# Patient Record
Sex: Female | Born: 1980 | Hispanic: No | Marital: Married | Smoking: Never smoker
Health system: Southern US, Community
[De-identification: ages and names within clinical notes are randomized; demographics above are authoritative.]

## PROBLEM LIST (undated history)

## (undated) ENCOUNTER — Inpatient Hospital Stay (HOSPITAL_COMMUNITY): Payer: Self-pay

## (undated) DIAGNOSIS — T4145XA Adverse effect of unspecified anesthetic, initial encounter: Secondary | ICD-10-CM

## (undated) DIAGNOSIS — F32A Depression, unspecified: Secondary | ICD-10-CM

## (undated) DIAGNOSIS — T8859XA Other complications of anesthesia, initial encounter: Secondary | ICD-10-CM

## (undated) DIAGNOSIS — K219 Gastro-esophageal reflux disease without esophagitis: Secondary | ICD-10-CM

## (undated) DIAGNOSIS — F329 Major depressive disorder, single episode, unspecified: Secondary | ICD-10-CM

## (undated) DIAGNOSIS — R0600 Dyspnea, unspecified: Secondary | ICD-10-CM

## (undated) DIAGNOSIS — G971 Other reaction to spinal and lumbar puncture: Secondary | ICD-10-CM

---
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
## 2013-04-30 ENCOUNTER — Ambulatory Visit (INDEPENDENT_AMBULATORY_CARE_PROVIDER_SITE_OTHER): Payer: Self-pay | Admitting: Obstetrics and Gynecology

## 2013-04-30 ENCOUNTER — Encounter: Payer: Self-pay | Admitting: Obstetrics and Gynecology

## 2013-04-30 VITALS — BP 109/68 | HR 76 | Ht 65.0 in | Wt 156.0 lb

## 2013-04-30 DIAGNOSIS — Z01812 Encounter for preprocedural laboratory examination: Secondary | ICD-10-CM

## 2013-04-30 DIAGNOSIS — Z309 Encounter for contraceptive management, unspecified: Secondary | ICD-10-CM

## 2013-04-30 DIAGNOSIS — IMO0001 Reserved for inherently not codable concepts without codable children: Secondary | ICD-10-CM

## 2013-04-30 DIAGNOSIS — Z9889 Other specified postprocedural states: Secondary | ICD-10-CM

## 2013-04-30 DIAGNOSIS — Z98891 History of uterine scar from previous surgery: Secondary | ICD-10-CM

## 2013-04-30 LAB — POCT URINE PREGNANCY: Preg Test, Ur: NEGATIVE

## 2013-04-30 MED ORDER — NORGESTIMATE-ETH ESTRADIOL 0.25-35 MG-MCG PO TABS: 1.0000 | ORAL_TABLET | Freq: Every day | ORAL | Status: DC

## 2013-04-30 NOTE — Progress Notes (Signed)
  Subjective:    Patient ID: Roberta Reynolds, female    DOB: September 04, 1980, 32 y.o.   MRN: [id number]  HPI  32 yo G3P2012 with LMP 04/01/2013 presenting today requesting contraception. Patient delivered via repeat cesarean section in [place] in June and was started on Depo Provera postpartum. Patient does not like the increased in appetite and weight gain caused by the depo and does not like the irregular bleeding. She desires to be restarted on OCP which she has used in the past. Patient is without any other concerns  History reviewed. No pertinent past medical history. Past Surgical History  Procedure Laterality Date  . Cesarean section     History reviewed. No pertinent family history. History  Substance Use Topics  . Smoking status: Never Smoker   . Smokeless tobacco: Never Used  . Alcohol Use: No      Review of Systems  All other systems reviewed and are negative.       Objective:   Physical Exam  Not indicated     Assessment & Plan:  32 yo here for OCP initiation - no contraindications to OCP  - Rx Sprintec provided. Patient advised to use back up birth control for 2 weeks - RTC for annual exam

## 2013-05-21 ENCOUNTER — Ambulatory Visit: Payer: Self-pay | Admitting: Obstetrics and Gynecology

## 2013-05-21 DIAGNOSIS — Z01419 Encounter for gynecological examination (general) (routine) without abnormal findings: Secondary | ICD-10-CM

## 2014-04-27 ENCOUNTER — Encounter: Payer: Self-pay | Admitting: Obstetrics and Gynecology

## 2014-06-03 ENCOUNTER — Other Ambulatory Visit: Payer: Self-pay | Admitting: Obstetrics and Gynecology

## 2014-06-04 ENCOUNTER — Other Ambulatory Visit: Payer: Self-pay | Admitting: Obstetrics and Gynecology

## 2014-07-26 ENCOUNTER — Other Ambulatory Visit: Payer: Self-pay | Admitting: Obstetrics and Gynecology

## 2014-07-27 NOTE — Telephone Encounter (Signed)
Patient needs to be seen for annual exam for further refills

## 2014-10-15 ENCOUNTER — Other Ambulatory Visit: Payer: Self-pay | Admitting: Obstetrics and Gynecology

## 2015-01-15 ENCOUNTER — Other Ambulatory Visit: Payer: Self-pay | Admitting: Obstetrics and Gynecology

## 2015-02-27 ENCOUNTER — Other Ambulatory Visit: Payer: Self-pay | Admitting: Obstetrics and Gynecology

## 2015-03-30 ENCOUNTER — Other Ambulatory Visit: Payer: Self-pay | Admitting: Obstetrics and Gynecology

## 2015-04-23 ENCOUNTER — Other Ambulatory Visit: Payer: Self-pay | Admitting: Obstetrics and Gynecology

## 2015-06-10 ENCOUNTER — Other Ambulatory Visit: Payer: Self-pay | Admitting: Obstetrics and Gynecology

## 2015-07-29 ENCOUNTER — Other Ambulatory Visit: Payer: Self-pay | Admitting: Obstetrics and Gynecology

## 2015-08-23 ENCOUNTER — Other Ambulatory Visit: Payer: Self-pay | Admitting: Obstetrics and Gynecology

## 2015-11-03 ENCOUNTER — Other Ambulatory Visit: Payer: Self-pay | Admitting: Obstetrics and Gynecology

## 2015-11-17 ENCOUNTER — Other Ambulatory Visit: Payer: Self-pay | Admitting: Obstetrics and Gynecology

## 2016-01-10 ENCOUNTER — Other Ambulatory Visit: Payer: Self-pay | Admitting: Obstetrics and Gynecology

## 2016-03-25 ENCOUNTER — Other Ambulatory Visit: Payer: Self-pay | Admitting: Obstetrics & Gynecology

## 2017-10-31 ENCOUNTER — Inpatient Hospital Stay (HOSPITAL_COMMUNITY): Payer: Medicaid Other

## 2017-10-31 ENCOUNTER — Encounter (HOSPITAL_COMMUNITY): Payer: Self-pay

## 2017-10-31 ENCOUNTER — Other Ambulatory Visit: Payer: Self-pay

## 2017-10-31 ENCOUNTER — Encounter (HOSPITAL_COMMUNITY): Payer: Self-pay | Admitting: Student

## 2017-10-31 ENCOUNTER — Inpatient Hospital Stay (HOSPITAL_COMMUNITY)
Admission: AD | Admit: 2017-10-31 | Discharge: 2017-10-31 | Disposition: A | Discharge status: Home / Self Care | Payer: Medicaid Other | Source: Ambulatory Visit | Attending: Obstetrics & Gynecology | Admitting: Obstetrics & Gynecology

## 2017-10-31 ENCOUNTER — Inpatient Hospital Stay (EMERGENCY_DEPARTMENT_HOSPITAL)
Admission: AD | Admit: 2017-10-31 | Discharge: 2017-10-31 | Disposition: A | Discharge status: Home / Self Care | Payer: Medicaid Other | Source: Ambulatory Visit | Attending: Obstetrics and Gynecology | Admitting: Obstetrics and Gynecology

## 2017-10-31 DIAGNOSIS — O26891 Other specified pregnancy related conditions, first trimester: Secondary | ICD-10-CM | POA: Diagnosis present

## 2017-10-31 DIAGNOSIS — R109 Unspecified abdominal pain: Secondary | ICD-10-CM | POA: Diagnosis present

## 2017-10-31 DIAGNOSIS — O418X10 Other specified disorders of amniotic fluid and membranes, first trimester, not applicable or unspecified: Secondary | ICD-10-CM | POA: Diagnosis not present

## 2017-10-31 DIAGNOSIS — O468X1 Other antepartum hemorrhage, first trimester: Secondary | ICD-10-CM

## 2017-10-31 DIAGNOSIS — O209 Hemorrhage in early pregnancy, unspecified: Secondary | ICD-10-CM | POA: Diagnosis present

## 2017-10-31 DIAGNOSIS — Z3491 Encounter for supervision of normal pregnancy, unspecified, first trimester: Secondary | ICD-10-CM

## 2017-10-31 DIAGNOSIS — Z3A08 8 weeks gestation of pregnancy: Secondary | ICD-10-CM | POA: Diagnosis not present

## 2017-10-31 DIAGNOSIS — O26899 Other specified pregnancy related conditions, unspecified trimester: Secondary | ICD-10-CM

## 2017-10-31 HISTORY — DX: Other complications of anesthesia, initial encounter: T88.59XA

## 2017-10-31 HISTORY — DX: Adverse effect of unspecified anesthetic, initial encounter: T41.45XA

## 2017-10-31 HISTORY — DX: Other reaction to spinal and lumbar puncture: G97.1

## 2017-10-31 LAB — CBC
HEMATOCRIT: 36.4 % (ref 36.0–46.0)
HEMOGLOBIN: 12.5 g/dL (ref 12.0–15.0)
MCH: 23.6 pg — AB (ref 26.0–34.0)
MCHC: 34.3 g/dL (ref 30.0–36.0)
MCV: 68.8 fL — AB (ref 78.0–100.0)
PLATELETS: 273 10*3/uL (ref 150–400)
RBC: 5.29 MIL/uL — AB (ref 3.87–5.11)
RDW: 15.3 % (ref 11.5–15.5)
WBC: 8.8 10*3/uL (ref 4.0–10.5)

## 2017-10-31 LAB — HCG, QUANTITATIVE, PREGNANCY: hCG, Beta Chain, Quant, S: 55891 m[IU]/mL — ABNORMAL HIGH (ref <5)

## 2017-10-31 LAB — URINALYSIS, ROUTINE W REFLEX MICROSCOPIC
BACTERIA UA: NONE SEEN
Bilirubin Urine: NEGATIVE
Glucose, UA: NEGATIVE mg/dL
Ketones, ur: NEGATIVE mg/dL
Leukocytes, UA: NEGATIVE
NITRITE: NEGATIVE
PROTEIN: NEGATIVE mg/dL
SPECIFIC GRAVITY, URINE: 1.011 (ref 1.005–1.030)
pH: 8 (ref 5.0–8.0)

## 2017-10-31 LAB — POCT PREGNANCY, URINE: Preg Test, Ur: POSITIVE — AB

## 2017-10-31 LAB — ABO/RH: ABO/RH(D): A POS

## 2017-10-31 NOTE — MAU Note (Signed)
Pt. States that she began bleeding today around 12pm. Pt. States she has not filled up a pad yet.  Pt. Is also complaining of some mild abdominal pain similar to contractions.

## 2017-10-31 NOTE — Discharge Instructions (Signed)
Subchorionic Hematoma °A subchorionic hematoma is a gathering of blood between the outer wall of the placenta and the inner wall of the womb (uterus). The placenta is the organ that connects the fetus to the wall of the uterus. The placenta performs the feeding, breathing (oxygen to the fetus), and waste removal (excretory work) of the fetus. °Subchorionic hematoma is the most common abnormality found on a result from ultrasonography done during the first trimester or early second trimester of pregnancy. If there has been little or no vaginal bleeding, early small hematomas usually shrink on their own and do not affect your baby or pregnancy. The blood is gradually absorbed over 1-2 weeks. When bleeding starts later in pregnancy or the hematoma is larger or occurs in an older pregnant woman, the outcome may not be as good. Larger hematomas may get bigger, which increases the chances for miscarriage. Subchorionic hematoma also increases the risk of premature detachment of the placenta from the uterus, preterm (premature) labor, and stillbirth. °Follow these instructions at home: °· Stay on bed rest if your health care provider recommends this. Although bed rest will not prevent more bleeding or prevent a miscarriage, your health care provider may recommend bed rest until you are advised otherwise. °· Avoid heavy lifting (more than 10 lb [4.5 kg]), exercise, sexual intercourse, or douching as directed by your health care provider. °· Keep track of the number of pads you use each day and how soaked (saturated) they are. Write down this information. °· Do not use tampons. °· Keep all follow-up appointments as directed by your health care provider. Your health care provider may ask you to have follow-up blood tests or ultrasound tests or both. °Get help right away if: °· You have severe cramps in your stomach, back, abdomen, or pelvis. °· You have a fever. °· You pass large clots or tissue. Save any tissue for your  health care provider to look at. °· Your bleeding increases or you become lightheaded, feel weak, or have fainting episodes. °This information is not intended to replace advice given to you by your health care provider. Make sure you discuss any questions you have with your health care provider. °Document Released: 09/27/2006 Document Revised: 11/18/2015 Document Reviewed: 01/09/2013 °Elsevier Interactive Patient Education © 2017 [employer] ° °

## 2017-10-31 NOTE — MAU Provider Note (Signed)
History     CSN: [id number]  Arrival date and time: 10/31/17 2005   First Provider Initiated Contact with Patient 10/31/17 2031      Chief Complaint  Patient presents with  . Vaginal Bleeding   HPI  Roberta Reynolds is a 37 y.o. [id number] at [redacted]w[redacted]d who presents with vaginal bleeding. Was seen in MAU this afternoon with same complaint. Had ultrasound that showed IUP with small [facility]. Patient given return precautions regarding returning for worsening vaginal bleeding. States this evening she saw several small blood clots (approximately the size of her finger tip). Otherwise the bleeding is no heavier. Mild lower abdominal cramping that has not changed. Very concerned that she is having a miscarriage.   OB History    Gravida  4   Para  2   Term  2   Preterm      AB  1   Living  2     SAB  1   TAB      Ectopic      Multiple      Live Births  2           Past Medical History:  Diagnosis Date  . Complication of anesthesia    spinal heachache   . Spinal headache     Past Surgical History:  Procedure Laterality Date  . CESAREAN SECTION      No family history on file.  Social History   Tobacco Use  . Smoking status: Never Smoker  . Smokeless tobacco: Never Used  Substance Use Topics  . Alcohol use: No  . Drug use: No    Allergies:  Allergies  Allergen Reactions  . Other Other (See Comments)    Epidural caused Spinal headache     Medications Prior to Admission  Medication Sig Dispense Refill Last Dose  . Prenatal Vit-Fe Fumarate-FA (PRENATAL MULTIVITAMIN) TABS tablet Take 1 tablet by mouth daily at 12 noon.   10/30/2017 at Unknown time    Review of Systems  Constitutional: Negative.   Gastrointestinal: Positive for abdominal pain. Negative for diarrhea, nausea and vomiting.  Genitourinary: Positive for vaginal bleeding.   Physical Exam   Blood pressure 106/64, pulse 66, temperature 98.8 F (37.1 C), temperature source Oral, resp. rate 18, last  menstrual period 09/05/2017, SpO2 100 %.  Physical Exam  Nursing note and vitals reviewed. Constitutional: She is oriented to person, place, and time. She appears well-developed and well-nourished. No distress.  HENT:  Head: Normocephalic and atraumatic.  Eyes: Conjunctivae are normal. Right eye exhibits no discharge. Left eye exhibits no discharge. No scleral icterus.  Neck: Normal range of motion.  Respiratory: Effort normal. No respiratory distress.  GI: Soft. There is no tenderness.  Genitourinary:  Genitourinary Comments: No blood on pad  Neurological: She is alert and oriented to person, place, and time.  Skin: Skin is warm and dry. She is not diaphoretic.  Psychiatric: She has a normal mood and affect. Her behavior is normal. Judgment and thought content normal.    MAU Course  Procedures Results for orders placed or performed during the hospital encounter of 10/31/17 (from the past 48 hour(s))  Urinalysis, Routine w reflex microscopic     Status: Abnormal   Collection Time: 10/31/17  1:04 PM  Result Value Ref Range   Color, Urine YELLOW YELLOW   APPearance CLEAR CLEAR   Specific Gravity, Urine 1.011 1.005 - 1.030   pH 8.0 5.0 - 8.0   Glucose, UA NEGATIVE NEGATIVE mg/dL  Hgb urine dipstick LARGE (A) NEGATIVE   Bilirubin Urine NEGATIVE NEGATIVE   Ketones, ur NEGATIVE NEGATIVE mg/dL   Protein, ur NEGATIVE NEGATIVE mg/dL   Nitrite NEGATIVE NEGATIVE   Leukocytes, UA NEGATIVE NEGATIVE   RBC / HPF 0-5 0 - 5 RBC/hpf   WBC, UA 0-5 0 - 5 WBC/hpf   Bacteria, UA NONE SEEN NONE SEEN   Squamous Epithelial / LPF 0-5 0 - 5    Comment: Please note change in reference range.   Mucus PRESENT     Comment: Performed at [facility], [address] [place], [place] [address]  Pregnancy, urine POC     Status: Abnormal   Collection Time: 10/31/17  1:19 PM  Result Value Ref Range   Preg Test, Ur POSITIVE (A) NEGATIVE    Comment:        THE SENSITIVITY OF THIS METHODOLOGY IS >24  mIU/mL   CBC     Status: Abnormal   Collection Time: 10/31/17  2:16 PM  Result Value Ref Range   WBC 8.8 4.0 - 10.5 K/uL   RBC 5.29 (H) 3.87 - 5.11 MIL/uL   Hemoglobin 12.5 12.0 - 15.0 g/dL   HCT 40.9 81.1 - 91.4 %   MCV 68.8 (L) 78.0 - 100.0 fL   MCH 23.6 (L) 26.0 - 34.0 pg   MCHC 34.3 30.0 - 36.0 g/dL   RDW 78.2 95.6 - 21.3 %   Platelets 273 150 - 400 K/uL    Comment: Performed at [facility], [address] [place], [place] [address]  ABO/Rh     Status: None   Collection Time: 10/31/17  2:16 PM  Result Value Ref Range   ABO/RH(D)      A POS Performed at [facility], [address], [place], [place] [address]   hCG, quantitative, pregnancy     Status: Abnormal   Collection Time: 10/31/17  2:16 PM  Result Value Ref Range   hCG, Beta Chain, Quant, S 55,891 (H) <5 mIU/mL    Comment:          GEST. AGE      CONC.  (mIU/mL)   <=1 WEEK        5 - 50     2 WEEKS       50 - 500     3 WEEKS       100 - 10,000     4 WEEKS     1,000 - 30,000     5 WEEKS     3,500 - 115,000   6-8 WEEKS     12,000 - 270,000    12 WEEKS     15,000 - 220,000        FEMALE AND NON-PREGNANT FEMALE:     LESS THAN 5 mIU/mL Performed at [facility], [address], [place], [place] [address]    US Ob Transvaginal  Result Date: 10/31/2017 CLINICAL DATA:  37 y/o  F; vaginal bleeding. EXAM: OBSTETRIC <14 WK ULTRASOUND TECHNIQUE: Transabdominal ultrasound was performed for evaluation of the gestation as well as the maternal uterus and adnexal regions. COMPARISON:  10/31/2017 pelvic ultrasound. FINDINGS: Intrauterine gestational sac: Single Yolk sac:  Visualized. Embryo:  Visualized. Cardiac Activity: Visualized. Heart Rate: 111 bpm CRL:   5.6 mm   6 w 2 d                  [place] EDC: 06/24/2018 Subchorionic hemorrhage: Tiny subchorionic hemorrhage, decreased when compared with prior study. Maternal  uterus/adnexae: Normal. IMPRESSION: 1. Single live intrauterine pregnancy with estimated gestational age  of [redacted] weeks and 2 days. 2. Tiny residual subchorionic hemorrhage, decreased from prior study. Electronically Signed   By: Mitzi Hansen M.D.   On: 10/31/2017 21:16   US Ob Less Than 14 Weeks With Ob Transvaginal  Result Date: 10/31/2017 CLINICAL DATA:  Abdominal pain affecting first trimester of pregnancy, vaginal bleeding in first trimester of pregnancy EXAM: OBSTETRIC <14 WK [place] AND TRANSVAGINAL OB US TECHNIQUE: Both transabdominal and transvaginal ultrasound examinations were performed for complete evaluation of the gestation as well as the maternal uterus, adnexal regions, and pelvic cul-de-sac. Transvaginal technique was performed to assess early pregnancy. COMPARISON:  None FINDINGS: Intrauterine gestational sac: Present, single, slightly irregular shape Yolk sac:  Present Embryo:  Present Cardiac Activity: Present Heart Rate: 114 bpm CRL:  5.7 mm   6 w   2 d                  [place] EDC: 06/24/2018 Subchorionic hemorrhage:  Small subchronic hemorrhage 11 x 19 x 8 mm Maternal uterus/adnexae: RIGHT ovary normal size and morphology 2.6 x 2.9 x 3.3 cm. LEFT ovary normal size and morphology 1.8 x 2.4 x 1.7 cm. No adnexal masses or free pelvic fluid. IMPRESSION: Single live intrauterine gestation at 6 weeks 2 days EGA by crown-rump length. Slightly irregular shape to gestational sac with presence of a small subchorionic hemorrhage. Electronically Signed   By: Ulyses Southward M.D.   On: 10/31/2017 16:05    MDM Ultrasound shows SIUP & SCH that is smaller than earlier today.  Discussed results with patient and reassured that the bleeding she saw was related to the [facility].   Assessment and Plan  A:  1. Subchorionic hematoma in first trimester, single or unspecified fetus   2. Vaginal bleeding in pregnancy, first trimester   3. Normal IUP (intrauterine pregnancy) on prenatal ultrasound, first trimester    P: Discharge home Discussed reasons to return to MAU ---- worsening bleeding Start prenatal  care  Judeth Horn 10/31/2017, 8:31 PM

## 2017-10-31 NOTE — MAU Note (Signed)
Pt states she was here earlier for vaginal bleeding. States she was told everything is normal. States she got home and felt light headed and passed some some blood clots. Bleeding is not heavier than it was earlier. States they are about 1cm. Pt reports some pain-cramping. Pt is worried she is losing the baby.

## 2017-10-31 NOTE — MAU Provider Note (Signed)
History     CSN: [id number]  Arrival date and time: 10/31/17 1255   First Provider Initiated Contact with Patient 10/31/17 1431      Chief Complaint  Patient presents with  . Vaginal Bleeding  . Abdominal Pain    mild abdominal pain    HPI   Ms.Roberta Reynolds is a 37 y.o. female [id number] @ [redacted]w[redacted]d here in MAU with complaints of vaginal bleeding. She says the bleeding started today around noon. The blood was bright red in color. Says she saw large spots of blood on her underwear. This is the first time she has had bleeding in this pregnancy. She is also having some abdominal pain that started at the same time. The pain is located in her LLQ. The pain comes and goes. Says she has not taken any medication for the pain. Says she had a stomach ache yesterday, however this pain is new. The pain is cramp like   OB History    Gravida  4   Para  2   Term  2   Preterm      AB  1   Living  2     SAB  1   TAB      Ectopic      Multiple      Live Births  2           Past Medical History:  Diagnosis Date  . Complication of anesthesia    spinal heachache   . Medical history non-contributory   . Spinal headache     Past Surgical History:  Procedure Laterality Date  . CESAREAN SECTION      History reviewed. No pertinent family history.  Social History   Tobacco Use  . Smoking status: Never Smoker  . Smokeless tobacco: Never Used  Substance Use Topics  . Alcohol use: No  . Drug use: No    Allergies:  Allergies  Allergen Reactions  . Other Other (See Comments)    Epidural caused Spinal headache     Medications Prior to Admission  Medication Sig Dispense Refill Last Dose  . Prenatal Vit-Fe Fumarate-FA (PRENATAL MULTIVITAMIN) TABS tablet Take 1 tablet by mouth daily at 12 noon.   10/30/2017 at Unknown time   Results for orders placed or performed during the hospital encounter of 10/31/17 (from the past 48 hour(s))  Urinalysis, Routine w reflex microscopic      Status: Abnormal   Collection Time: 10/31/17  1:04 PM  Result Value Ref Range   Color, Urine YELLOW YELLOW   APPearance CLEAR CLEAR   Specific Gravity, Urine 1.011 1.005 - 1.030   pH 8.0 5.0 - 8.0   Glucose, UA NEGATIVE NEGATIVE mg/dL   Hgb urine dipstick LARGE (A) NEGATIVE   Bilirubin Urine NEGATIVE NEGATIVE   Ketones, ur NEGATIVE NEGATIVE mg/dL   Protein, ur NEGATIVE NEGATIVE mg/dL   Nitrite NEGATIVE NEGATIVE   Leukocytes, UA NEGATIVE NEGATIVE   RBC / HPF 0-5 0 - 5 RBC/hpf   WBC, UA 0-5 0 - 5 WBC/hpf   Bacteria, UA NONE SEEN NONE SEEN   Squamous Epithelial / LPF 0-5 0 - 5    Comment: Please note change in reference range.   Mucus PRESENT     Comment: Performed at [facility], [address] [place], [place] [address]  Pregnancy, urine POC     Status: Abnormal   Collection Time: 10/31/17  1:19 PM  Result Value Ref Range   Preg Test, Ur POSITIVE (  A) NEGATIVE    Comment:        THE SENSITIVITY OF THIS METHODOLOGY IS >24 mIU/mL   CBC     Status: Abnormal   Collection Time: 10/31/17  2:16 PM  Result Value Ref Range   WBC 8.8 4.0 - 10.5 K/uL   RBC 5.29 (H) 3.87 - 5.11 MIL/uL   Hemoglobin 12.5 12.0 - 15.0 g/dL   HCT 16.1 09.6 - 04.5 %   MCV 68.8 (L) 78.0 - 100.0 fL   MCH 23.6 (L) 26.0 - 34.0 pg   MCHC 34.3 30.0 - 36.0 g/dL   RDW 40.9 81.1 - 91.4 %   Platelets 273 150 - 400 K/uL    Comment: Performed at [facility], [address], [place], [place] [address]  ABO/Rh     Status: None (Preliminary result)   Collection Time: 10/31/17  2:16 PM  Result Value Ref Range   ABO/RH(D)      A POS Performed at [facility], [address] [place], [place] [address]   hCG, quantitative, pregnancy     Status: Abnormal   Collection Time: 10/31/17  2:16 PM  Result Value Ref Range   hCG, Beta Chain, Quant, S 55,891 (H) <5 mIU/mL    Comment:          GEST. AGE      CONC.  (mIU/mL)   <=1 WEEK        5 - 50     2 WEEKS       50 - 500     3 WEEKS       100 - 10,000      4 WEEKS     1,000 - 30,000     5 WEEKS     3,500 - 115,000   6-8 WEEKS     12,000 - 270,000    12 WEEKS     15,000 - 220,000        FEMALE AND NON-PREGNANT FEMALE:     LESS THAN 5 mIU/mL Performed at [facility], [address], [place], [place] [address]    US Ob Less Than 14 Weeks With Ob Transvaginal  Result Date: 10/31/2017 CLINICAL DATA:  Abdominal pain affecting first trimester of pregnancy, vaginal bleeding in first trimester of pregnancy EXAM: OBSTETRIC <14 WK [place] AND TRANSVAGINAL OB US TECHNIQUE: Both transabdominal and transvaginal ultrasound examinations were performed for complete evaluation of the gestation as well as the maternal uterus, adnexal regions, and pelvic cul-de-sac. Transvaginal technique was performed to assess early pregnancy. COMPARISON:  None FINDINGS: Intrauterine gestational sac: Present, single, slightly irregular shape Yolk sac:  Present Embryo:  Present Cardiac Activity: Present Heart Rate: 114 bpm CRL:  5.7 mm   6 w   2 d                  [place] EDC: 06/24/2018 Subchorionic hemorrhage:  Small subchronic hemorrhage 11 x 19 x 8 mm Maternal uterus/adnexae: RIGHT ovary normal size and morphology 2.6 x 2.9 x 3.3 cm. LEFT ovary normal size and morphology 1.8 x 2.4 x 1.7 cm. No adnexal masses or free pelvic fluid. IMPRESSION: Single live intrauterine gestation at 6 weeks 2 days EGA by crown-rump length. Slightly irregular shape to gestational sac with presence of a small subchorionic hemorrhage. Electronically Signed   By: Ulyses Southward M.D.   On: 10/31/2017 16:05   Review of Systems  Gastrointestinal: Positive for abdominal pain and nausea. Negative for vomiting.  Genitourinary: Positive for vaginal  bleeding.   Physical Exam   Blood pressure 109/62, pulse 73, temperature 98.4 F (36.9 C), temperature source Oral, resp. rate 18, height  (1.626 m), weight 178 lb (80.7 kg), last menstrual period 09/05/2017, SpO2 100 %.  Physical Exam  Constitutional: She is oriented  to person, place, and time. She appears well-developed and well-nourished. No distress.  HENT:  Head: Normocephalic.  Eyes: Pupils are equal, round, and reactive to light.  GI: Soft. She exhibits no distension. There is no tenderness. There is no rebound.  Genitourinary:  Genitourinary Comments: Cervix: closed, posterior, small amount of dark red blood noted on exam glove.   Musculoskeletal: Normal range of motion.  Neurological: She is alert and oriented to person, place, and time.  Skin: Skin is warm. She is not diaphoretic.  Psychiatric: Her behavior is normal.   MAU Course  Procedures  None  MDM  HIV, CBC, Hcg, ABO US OB transvaginal   A positive blood type   Assessment and Plan    A:  1. Subchorionic hematoma in first trimester, single or unspecified fetus   2. Vaginal bleeding in pregnancy, first trimester   3. Abdominal pain affecting pregnancy     P:  Discharge home with strict return precautions Bleeding precautions Pelvic rest Return to MAU if symptoms worsen  Keep Ob appointment   Darielys Giglia, Harolyn Rutherford, NP 10/31/2017 7:36 PM

## 2017-11-06 ENCOUNTER — Encounter: Payer: Self-pay | Admitting: Obstetrics & Gynecology

## 2017-11-06 ENCOUNTER — Ambulatory Visit (INDEPENDENT_AMBULATORY_CARE_PROVIDER_SITE_OTHER): Payer: Medicaid Other | Admitting: Obstetrics & Gynecology

## 2017-11-06 VITALS — BP 100/61 | HR 85 | Wt 177.0 lb

## 2017-11-06 DIAGNOSIS — T4145XS Adverse effect of unspecified anesthetic, sequela: Secondary | ICD-10-CM

## 2017-11-06 DIAGNOSIS — O219 Vomiting of pregnancy, unspecified: Secondary | ICD-10-CM

## 2017-11-06 DIAGNOSIS — T8859XA Other complications of anesthesia, initial encounter: Secondary | ICD-10-CM | POA: Insufficient documentation

## 2017-11-06 DIAGNOSIS — O09521 Supervision of elderly multigravida, first trimester: Secondary | ICD-10-CM

## 2017-11-06 DIAGNOSIS — T8859XS Other complications of anesthesia, sequela: Secondary | ICD-10-CM

## 2017-11-06 DIAGNOSIS — Z98891 History of uterine scar from previous surgery: Secondary | ICD-10-CM

## 2017-11-06 DIAGNOSIS — T4145XA Adverse effect of unspecified anesthetic, initial encounter: Secondary | ICD-10-CM | POA: Insufficient documentation

## 2017-11-06 DIAGNOSIS — O09529 Supervision of elderly multigravida, unspecified trimester: Secondary | ICD-10-CM | POA: Insufficient documentation

## 2017-11-06 NOTE — Progress Notes (Signed)
Pt states she wants to wait until next ROB visit to do prenatal labs and NIPS  DATING AND VIABILITY SONOGRAM   Roberta Reynolds is a 37 y.o. year old G8P2012 with LMP Patient's last menstrual period was 09/05/2017 (exact date). which would correlate to  [redacted]w[redacted]d weeks gestation.  She has regular menstrual cycles.   She is here today for a confirmatory initial sonogram.    GESTATION: SINGLETON     FETAL ACTIVITY:          Heart rate: 150BPM          The fetus is active.  Gestational criteria: Estimated Date of Delivery: 06/24/18 by LMP now at [redacted]w[redacted]d  Previous Scans:1 CROWN RUMP LENGTH           1.13CM         7.1 weeks      AVERAGE EGA(BY THIS SCAN):  7.1 weeks  WORKING EDD( LMP ):  06/24/18     TECHNICIAN COMMENTS:  SLIUP measuring 7.1wks by CRL with FHR 150bpm

## 2017-11-06 NOTE — Progress Notes (Signed)
Subjective:   Roberta Reynolds is a 37 y.o. [id number] at [redacted]w[redacted]d by early ultrasound not consistent with LMP being seen today for her first obstetrical visit.  Her obstetrical history is significant for advanced maternal age. Patient does intend to breast feed. Pregnancy history fully reviewed.  Patient reports bleeding recently; was seen in [facility] and had negative evaluation. No current bleeding or other concerns. Last pap was normal 2 years ago at [facility]; declines pelvic exam today. Also declines blood work today, to be done at next visit.  HISTORY: OB History  Gravida Para Term Preterm AB Living  0 1 2  SAB TAB Ectopic Multiple Live Births  1 0 0 0 2    # Outcome Date GA Lbr Len/2nd Weight Sex Delivery Anes PTL Lv  4 Current           3 Term 01/07/13    F CS-LTranv   LIV  2 Term 07/19/10    F CS-LTranv   LIV  1 SAB 06/26/09     SAB       Obstetric Comments  Unable to tolerate epidural - Needs general anesthesia only    Last pap smear was done 2017 and was normal  Past Medical History:  Diagnosis Date  . Complication of anesthesia    spinal heachache   . Spinal headache    Past Surgical History:  Procedure Laterality Date  . CESAREAN SECTION     History reviewed. No pertinent family history. Social History   Tobacco Use  . Smoking status: Never Smoker  . Smokeless tobacco: Never Used  Substance Use Topics  . Alcohol use: No  . Drug use: No   Allergies  Allergen Reactions  . Other Other (See Comments)    Epidural caused Spinal headache    Current Outpatient Medications on File Prior to Visit  Medication Sig Dispense Refill  . Prenatal Vit-Fe Fumarate-FA (PRENATAL MULTIVITAMIN) TABS tablet Take 1 tablet by mouth daily at 12 noon.     No current facility-administered medications on file prior to visit.     Review of Systems Pertinent items noted in HPI and remainder of comprehensive ROS otherwise negative.  Exam   Vitals:   11/06/17 1052  BP: 100/61    Pulse: 85  Weight: 177 lb (80.3 kg)   Fetal Heart Rate (bpm): 150   Uterus:     Pelvic Exam: Deferred   System: General: well-developed, well-nourished female in no acute distress   Breast:  normal appearance, no masses or tenderness   Skin: normal coloration and turgor, no rashes   Neurologic: oriented, normal, negative, normal mood   Extremities: normal strength, tone, and muscle mass, ROM of all joints is normal   HEENT PERRLA, extraocular movement intact and sclera clear, anicteric   Mouth/Teeth mucous membranes moist, pharynx normal without lesions and dental hygiene good   Neck supple and no masses   Cardiovascular: regular rate and rhythm   Respiratory:  no respiratory distress, normal breath sounds   Abdomen: soft, non-tender; bowel sounds normal; no masses,  no organomegaly     Assessment:   Pregnancy: [name] Patient Active Problem List   Diagnosis Date Noted  . Supervision of high-risk pregnancy of multigravida >22 years of age 63/14/2019  . Complication of anesthesia   . History of cesarean section x 3 04/30/2013     Plan:  1. History of cesarean section x 3 Plans repeat cesarean section  2. Nausea/vomiting in pregnancy  Bonjesta sample given  3. Complication of anesthesia, sequela Had spinal headache, needed blood patch.  Has PTSD die to this, never wants regional anesthesia again. Only wants General.   4. Supervision of high-risk pregnancy of elderly multigravida Desires Babyscripts, this was authorized. - Enroll Patient in Babyscripts - Babyscripts Schedule Optimization  Initial labs including NIPS to be drawn next visit.  Continue prenatal vitamins. Ultrasound discussed; fetal anatomic survey: to be ordered later. Problem list reviewed and updated. The nature of [facility] with multiple MDs and other Advanced Practice Providers was explained to patient; also emphasized that residents, students are part of our  team. Routine obstetric precautions reviewed. Return in about 5 weeks (around 12/11/2017) for OB 12 week visit (Babyscripts); prenatal labs and Panorama.     Jaynie Collins, MD, FACOG Obstetrician & Gynecologist, [facility] for [employer], [facility]

## 2017-11-06 NOTE — Patient Instructions (Addendum)
Return to clinic for any scheduled appointments or obstetric concerns, or go to [facility]U for evaluation    First Trimester of Pregnancy The first trimester of pregnancy is from week 1 until the end of week 13 (months 1 through 3). A week after a sperm fertilizes an egg, the egg will implant on the wall of the uterus. This embryo will begin to develop into a baby. Genes from you and your partner will form the baby. The female genes will determine whether the baby will be a boy or a girl. At 6-8 weeks, the eyes and face will be formed, and the heartbeat can be seen on ultrasound. At the end of 12 weeks, all the baby's organs will be formed. Now that you are pregnant, you will want to do everything you can to have a healthy baby. Two of the most important things are to get good prenatal care and to follow your health care provider's instructions. Prenatal care is all the medical care you receive before the baby's birth. This care will help prevent, find, and treat any problems during the pregnancy and childbirth. Body changes during your first trimester Your body goes through many changes during pregnancy. The changes vary from woman to woman.  You may gain or lose a couple of pounds at first.  You may feel sick to your stomach (nauseous) and you may throw up (vomit). If the vomiting is uncontrollable, call your health care provider.  You may tire easily.  You may develop headaches that can be relieved by medicines. All medicines should be approved by your health care provider.  You may urinate more often. Painful urination may mean you have a bladder infection.  You may develop heartburn as a result of your pregnancy.  You may develop constipation because certain hormones are causing the muscles that push stool through your intestines to slow down.  You may develop hemorrhoids or swollen veins (varicose veins).  Your breasts may begin to grow larger and become tender. Your nipples may stick out  more, and the tissue that surrounds them (areola) may become darker.  Your gums may bleed and may be sensitive to brushing and flossing.  Dark spots or blotches (chloasma, mask of pregnancy) may develop on your face. This will likely fade after the baby is born.  Your menstrual periods will stop.  You may have a loss of appetite.  You may develop cravings for certain kinds of food.  You may have changes in your emotions from day to day, such as being excited to be pregnant or being concerned that something may go wrong with the pregnancy and baby.  You may have more vivid and strange dreams.  You may have changes in your hair. These can include thickening of your hair, rapid growth, and changes in texture. Some women also have hair loss during or after pregnancy, or hair that feels dry or thin. Your hair will most likely return to normal after your baby is born.  What to expect at prenatal visits During a routine prenatal visit:  You will be weighed to make sure you and the baby are growing normally.  Your blood pressure will be taken.  Your abdomen will be measured to track your baby's growth.  The fetal heartbeat will be listened to between weeks 10 and 14 of your pregnancy.  Test results from any previous visits will be discussed.  Your health care provider may ask you:  How you are feeling.  If you are  feeling the baby move.  If you have had any abnormal symptoms, such as leaking fluid, bleeding, severe headaches, or abdominal cramping.  If you are using any tobacco products, including cigarettes, chewing tobacco, and electronic cigarettes.  If you have any questions.  Other tests that may be performed during your first trimester include:  Blood tests to find your blood type and to check for the presence of any previous infections. The tests will also be used to check for low iron levels (anemia) and protein on red blood cells (Rh antibodies). Depending on your risk  factors, or if you previously had diabetes during pregnancy, you may have tests to check for high blood sugar that affects pregnant women (gestational diabetes).  Urine tests to check for infections, diabetes, or protein in the urine.  An ultrasound to confirm the proper growth and development of the baby.  Fetal screens for spinal cord problems (spina bifida) and Down syndrome.  HIV (human immunodeficiency virus) testing. Routine prenatal testing includes screening for HIV, unless you choose not to have this test.  You may need other tests to make sure you and the baby are doing well.  Follow these instructions at home: Medicines  Follow your health care provider's instructions regarding medicine use. Specific medicines may be either safe or unsafe to take during pregnancy.  Take a prenatal vitamin that contains at least 600 micrograms (mcg) of folic acid.  If you develop constipation, try taking a stool softener if your health care provider approves. Eating and drinking  Eat a balanced diet that includes fresh fruits and vegetables, whole grains, good sources of protein such as meat, eggs, or tofu, and low-fat dairy. Your health care provider will help you determine the amount of weight gain that is right for you.  Avoid raw meat and uncooked cheese. These carry germs that can cause birth defects in the baby.  Eating four or five small meals rather than three large meals a day may help relieve nausea and vomiting. If you start to feel nauseous, eating a few soda crackers can be helpful. Drinking liquids between meals, instead of during meals, also seems to help ease nausea and vomiting.  Limit foods that are high in fat and processed sugars, such as fried and sweet foods.  To prevent constipation: ? Eat foods that are high in fiber, such as fresh fruits and vegetables, whole grains, and beans. ? Drink enough fluid to keep your urine clear or pale yellow. Activity  Exercise only  as directed by your health care provider. Most women can continue their usual exercise routine during pregnancy. Try to exercise for 30 minutes at least 5 days a week. Exercising will help you: ? Control your weight. ? Stay in shape. ? Be prepared for labor and delivery.  Experiencing pain or cramping in the lower abdomen or lower back is a good sign that you should stop exercising. Check with your health care provider before continuing with normal exercises.  Try to avoid standing for long periods of time. Move your legs often if you must stand in one place for a long time.  Avoid heavy lifting.  Wear low-heeled shoes and practice good posture.  You may continue to have sex unless your health care provider tells you not to. Relieving pain and discomfort  Wear a good support bra to relieve breast tenderness.  Take warm sitz baths to soothe any pain or discomfort caused by hemorrhoids. Use hemorrhoid cream if your health care provider approves.  Rest with your legs elevated if you have leg cramps or low back pain.  If you develop varicose veins in your legs, wear support hose. Elevate your feet for 15 minutes, 3-4 times a day. Limit salt in your diet. Prenatal care  Schedule your prenatal visits by the twelfth week of pregnancy. They are usually scheduled monthly at first, then more often in the last 2 months before delivery.  Write down your questions. Take them to your prenatal visits.  Keep all your prenatal visits as told by your health care provider. This is important. Safety  Wear your seat belt at all times when driving.  Make a list of emergency phone numbers, including numbers for family, friends, the hospital, and police and fire departments. General instructions  Ask your health care provider for a referral to a local prenatal education class. Begin classes no later than the beginning of month 6 of your pregnancy.  Ask for help if you have counseling or nutritional  needs during pregnancy. Your health care provider can offer advice or refer you to specialists for help with various needs.  Do not use hot tubs, steam rooms, or saunas.  Do not douche or use tampons or scented sanitary pads.  Do not cross your legs for long periods of time.  Avoid cat litter boxes and soil used by cats. These carry germs that can cause birth defects in the baby and possibly loss of the fetus by miscarriage or stillbirth.  Avoid all smoking, herbs, alcohol, and medicines not prescribed by your health care provider. Chemicals in these products affect the formation and growth of the baby.  Do not use any products that contain nicotine or tobacco, such as cigarettes and e-cigarettes. If you need help quitting, ask your health care provider. You may receive counseling support and other resources to help you quit.  Schedule a dentist appointment. At home, brush your teeth with a soft toothbrush and be gentle when you floss. Contact a health care provider if:  You have dizziness.  You have mild pelvic cramps, pelvic pressure, or nagging pain in the abdominal area.  You have persistent nausea, vomiting, or diarrhea.  You have a bad smelling vaginal discharge.  You have pain when you urinate.  You notice increased swelling in your face, hands, legs, or ankles.  You are exposed to fifth disease or chickenpox.  You are exposed to [place] measles (rubella) and have never had it. Get help right away if:  You have a fever.  You are leaking fluid from your vagina.  You have spotting or bleeding from your vagina.  You have severe abdominal cramping or pain.  You have rapid weight gain or loss.  You vomit blood or material that looks like coffee grounds.  You develop a severe headache.  You have shortness of breath.  You have any kind of trauma, such as from a fall or a car accident. Summary  The first trimester of pregnancy is from week 1 until the end of week 13  (months 1 through 3).  Your body goes through many changes during pregnancy. The changes vary from woman to woman.  You will have routine prenatal visits. During those visits, your health care provider will examine you, discuss any test results you may have, and talk with you about how you are feeling. This information is not intended to replace advice given to you by your health care provider. Make sure you discuss any questions you have with your health  care provider. Document Released: 06/06/2001 Document Revised: 05/24/2016 Document Reviewed: 05/24/2016 Elsevier Interactive Patient Education  [employer].

## 2017-11-07 ENCOUNTER — Encounter (INDEPENDENT_AMBULATORY_CARE_PROVIDER_SITE_OTHER): Payer: Medicaid Other | Admitting: *Deleted

## 2017-11-07 DIAGNOSIS — O09521 Supervision of elderly multigravida, first trimester: Secondary | ICD-10-CM | POA: Diagnosis not present

## 2017-11-12 DIAGNOSIS — O09529 Supervision of elderly multigravida, unspecified trimester: Secondary | ICD-10-CM

## 2017-12-03 ENCOUNTER — Telehealth: Payer: Self-pay | Admitting: *Deleted

## 2017-12-03 MED ORDER — PROMETHAZINE HCL 12.5 MG RE SUPP: 12.5000 mg | Freq: Four times a day (QID) | RECTAL | 0 refills | Status: DC | PRN

## 2017-12-03 NOTE — Telephone Encounter (Signed)
Pt called in stating she was not able to keep Bonjesta down as her nausea and vomiting has increased. Sent phenergan supp per protocol.

## 2017-12-07 ENCOUNTER — Other Ambulatory Visit: Payer: Self-pay | Admitting: *Deleted

## 2017-12-07 MED ORDER — PROMETHAZINE HCL 12.5 MG RE SUPP: 12.5000 mg | Freq: Four times a day (QID) | RECTAL | 0 refills | Status: DC | PRN

## 2017-12-11 ENCOUNTER — Encounter: Payer: Self-pay | Admitting: Family Medicine

## 2017-12-11 ENCOUNTER — Ambulatory Visit (INDEPENDENT_AMBULATORY_CARE_PROVIDER_SITE_OTHER): Payer: Medicaid Other | Admitting: Family Medicine

## 2017-12-11 ENCOUNTER — Other Ambulatory Visit (HOSPITAL_COMMUNITY)
Admission: RE | Admit: 2017-12-11 | Discharge: 2017-12-11 | Disposition: A | Discharge status: Home / Self Care | Payer: Medicaid Other | Source: Ambulatory Visit | Attending: Family Medicine | Admitting: Family Medicine

## 2017-12-11 VITALS — BP 104/68 | HR 97 | Wt 176.0 lb

## 2017-12-11 DIAGNOSIS — O09529 Supervision of elderly multigravida, unspecified trimester: Secondary | ICD-10-CM | POA: Insufficient documentation

## 2017-12-11 DIAGNOSIS — Z3A00 Weeks of gestation of pregnancy not specified: Secondary | ICD-10-CM | POA: Diagnosis not present

## 2017-12-11 DIAGNOSIS — O09522 Supervision of elderly multigravida, second trimester: Secondary | ICD-10-CM

## 2017-12-11 NOTE — Patient Instructions (Signed)

## 2017-12-11 NOTE — Progress Notes (Signed)
   PRENATAL VISIT NOTE  Subjective:  Roberta GarinMarwa Reynolds is a 37 y.o. [id number] at 5258w1d being seen today for ongoing prenatal care.  She is currently monitored for the following issues for this high-risk pregnancy and has History of cesarean section x 3; Supervision of high-risk pregnancy of multigravida >39 years of age; and Complication of anesthesia on their problem list.  Patient reports no complaints.  Contractions: Not present. Vag. Bleeding: None.  Movement: Absent. Denies leaking of fluid.   The following portions of the patient's history were reviewed and updated as appropriate: allergies, current medications, past family history, past medical history, past social history, past surgical history and problem list. Problem list updated.  Objective:   Vitals:   12/11/17 1115  BP: 104/68  Pulse: 97  Weight: 176 lb (79.8 kg)    Fetal Status:     Movement: Absent     General:  Alert, oriented and cooperative. Patient is in no acute distress.  Skin: Skin is warm and dry. No rash noted.   Breast: Nml appearance, no masses  Cardiovascular: Normal heart rate noted  Respiratory: Normal respiratory effort, no problems with respiration noted  Abdomen: Soft, gravid, appropriate for gestational age.  Pain/Pressure: Absent     Pelvic: Cervical exam performed        Extremities: Normal range of motion.  Edema: None  Mental Status: Normal mood and affect. Normal behavior. Normal judgment and thought content.   Assessment and Plan:  Pregnancy: [id number] at 1558w1d  1. Supervision of high-risk pregnancy of multigravida >39 years of age New OB labs and pap today - Obstetric Panel, Including HIV - Culture, OB Urine - Urine cytology ancillary only - Genetic Screening - Hemoglobinopathy evaluation - Cystic Fibrosis Mutation 97 - SMN1 COPY NUMBER ANALYSIS (SMA Carrier Screen) - Cytology - PAP  General obstetric precautions including but not limited to vaginal bleeding, contractions, leaking of  fluid and fetal movement were reviewed in detail with the patient. Please refer to After Visit Summary for other counseling recommendations.  Return in 1 month (on 01/08/2018).  Future Appointments  Date Time Provider Department Center  01/09/2018 11:00 AM Federico FlakeNewton, Kimberly Niles, MD [facility]    Reva Boresanya S Mischa Brittingham, MD

## 2017-12-12 LAB — URINE CYTOLOGY ANCILLARY ONLY
Chlamydia: NEGATIVE
NEISSERIA GONORRHEA: NEGATIVE

## 2017-12-12 LAB — OBSTETRIC PANEL, INCLUDING HIV
Antibody Screen: NEGATIVE
BASOS ABS: 0 10*3/uL (ref 0.0–0.2)
BASOS: 0 %
EOS (ABSOLUTE): 0.1 10*3/uL (ref 0.0–0.4)
EOS: 1 %
HEMATOCRIT: 32.9 % — AB (ref 34.0–46.6)
HEMOGLOBIN: 11.3 g/dL (ref 11.1–15.9)
HIV Screen 4th Generation wRfx: NONREACTIVE
Hepatitis B Surface Ag: NEGATIVE
IMMATURE GRANULOCYTES: 0 %
Immature Grans (Abs): 0 10*3/uL (ref 0.0–0.1)
LYMPHS ABS: 1.7 10*3/uL (ref 0.7–3.1)
LYMPHS: 22 %
MCH: 23.7 pg — AB (ref 26.6–33.0)
MCHC: 34.3 g/dL (ref 31.5–35.7)
MCV: 69 fL — AB (ref 79–97)
MONOS ABS: 0.5 10*3/uL (ref 0.1–0.9)
Monocytes: 6 %
Neutrophils Absolute: 5.6 10*3/uL (ref 1.4–7.0)
Neutrophils: 71 %
Platelets: 298 10*3/uL (ref 150–450)
RBC: 4.76 x10E6/uL (ref 3.77–5.28)
RDW: 15.4 % (ref 12.3–15.4)
RPR Ser Ql: NONREACTIVE
RUBELLA: 4.8 {index} (ref >0.99)
Rh Factor: POSITIVE
WBC: 7.9 10*3/uL (ref 3.4–10.8)

## 2017-12-13 LAB — CYTOLOGY - PAP
ADEQUACY: ABSENT
DIAGNOSIS: NEGATIVE
HPV: NOT DETECTED

## 2017-12-14 LAB — CULTURE, OB URINE

## 2017-12-14 LAB — URINE CULTURE, OB REFLEX

## 2017-12-18 LAB — SMN1 COPY NUMBER ANALYSIS (SMA CARRIER SCREENING)

## 2017-12-18 LAB — CYSTIC FIBROSIS MUTATION 97: GENE DIS ANAL CARRIER INTERP BLD/T-IMP: NOT DETECTED

## 2017-12-18 LAB — HEMOGLOBINOPATHY EVALUATION
HGB C: 0 %
HGB S: 0 %
HGB VARIANT: 0 %
Hemoglobin A2 Quantitation: 3.6 % — ABNORMAL HIGH (ref 1.8–3.2)
Hemoglobin F Quantitation: 0.5 % (ref 0.0–2.0)
Hgb A: 95.9 % — ABNORMAL LOW (ref 96.4–98.8)

## 2018-01-09 ENCOUNTER — Encounter: Payer: Medicaid Other | Admitting: Family Medicine

## 2018-01-15 ENCOUNTER — Ambulatory Visit (INDEPENDENT_AMBULATORY_CARE_PROVIDER_SITE_OTHER): Payer: Medicaid Other | Admitting: Family Medicine

## 2018-01-15 VITALS — BP 108/74 | HR 72 | Wt 182.0 lb

## 2018-01-15 DIAGNOSIS — M25561 Pain in right knee: Secondary | ICD-10-CM

## 2018-01-15 DIAGNOSIS — G8929 Other chronic pain: Secondary | ICD-10-CM

## 2018-01-15 DIAGNOSIS — O09529 Supervision of elderly multigravida, unspecified trimester: Secondary | ICD-10-CM

## 2018-01-15 NOTE — Progress Notes (Signed)
   PRENATAL VISIT NOTE  Subjective:  Roberta Reynolds is a 37 y.o. [id number] at 2453w1d being seen today for ongoing prenatal care.  She is currently monitored for the following issues for this low-risk pregnancy and has History of cesarean section x 2; Supervision of high-risk pregnancy of multigravida >39 years of age; and Complication of anesthesia on their problem list.  Patient reports no complaints.  Contractions: Not present.  .  Movement: Present. Denies leaking of fluid.   The following portions of the patient's history were reviewed and updated as appropriate: allergies, current medications, past family history, past medical history, past social history, past surgical history and problem list. Problem list updated.  Objective:   Vitals:   01/15/18 1334  BP: 108/74  Pulse: 72  Weight: 182 lb (82.6 kg)    Fetal Status: Fetal Heart Rate (bpm): 156   Movement: Present     General:  Alert, oriented and cooperative. Patient is in no acute distress.  Skin: Skin is warm and dry. No rash noted.   Cardiovascular: Normal heart rate noted  Respiratory: Normal respiratory effort, no problems with respiration noted  Abdomen: Soft, gravid, appropriate for gestational age.  Pain/Pressure: Absent     Pelvic: Cervical exam deferred        Extremities: Normal range of motion.  Edema: None  Mental Status: Normal mood and affect. Normal behavior. Normal judgment and thought content.   Assessment and Plan:  Pregnancy: [id number] at 5253w1d  1. Supervision of high-risk pregnancy of multigravida >39 years of age Anatomy u/s ordered - US MFM OB DETAIL +14 WK; Future  2. Chronic pain of right knee Advised that likely very little would be done during pregnancy, usual NSAIDS are contraindicated. May do shielded x-rays if needed, possibly PT. - AMB referral to orthopedics  General obstetric precautions including but not limited to vaginal bleeding, contractions, leaking of fluid and fetal movement were  reviewed in detail with the patient. Please refer to After Visit Summary for other counseling recommendations.  Return in about 1 month (around 02/12/2018).  Future Appointments  Date Time Provider Department Center  01/24/2018  8:45 AM Tarry KosXu, Naiping M, MD [facility] None  02/05/2018  8:15 AM [facility] US 4 [facility]  02/12/2018  2:00 PM Reva BoresPratt, Luvina Poirier S, MD [facility]    Reva Boresanya S Daishon Chui, MD

## 2018-01-15 NOTE — Patient Instructions (Addendum)
Please see an orthopedist for knee pain.  Breastfeeding Choosing to breastfeed is one of the best decisions you can make for yourself and your baby. A change in hormones during pregnancy causes your breasts to make breast milk in your milk-producing glands. Hormones prevent breast milk from being released before your baby is born. They also prompt milk flow after birth. Once breastfeeding has begun, thoughts of your baby, as well as his or her sucking or crying, can stimulate the release of milk from your milk-producing glands. Benefits of breastfeeding Research shows that breastfeeding offers many health benefits for infants and mothers. It also offers a cost-free and convenient way to feed your baby. For your baby  Your first milk (colostrum) helps your baby's digestive system to function better.  Special cells in your milk (antibodies) help your baby to fight off infections.  Breastfed babies are less likely to develop asthma, allergies, obesity, or type 2 diabetes. They are also at lower risk for sudden infant death syndrome (SIDS).  Nutrients in breast milk are better able to meet your baby's needs compared to infant formula.  Breast milk improves your baby's brain development. For you  Breastfeeding helps to create a very special bond between you and your baby.  Breastfeeding is convenient. Breast milk costs nothing and is always available at the correct temperature.  Breastfeeding helps to burn calories. It helps you to lose the weight that you gained during pregnancy.  Breastfeeding makes your uterus return faster to its size before pregnancy. It also slows bleeding (lochia) after you give birth.  Breastfeeding helps to lower your risk of developing type 2 diabetes, osteoporosis, rheumatoid arthritis, cardiovascular disease, and breast, ovarian, uterine, and endometrial cancer later in life. Breastfeeding basics Starting breastfeeding  Find a comfortable place to sit or lie  down, with your neck and back well-supported.  Place a pillow or a rolled-up blanket under your baby to bring him or her to the level of your breast (if you are seated). Nursing pillows are specially designed to help support your arms and your baby while you breastfeed.  Make sure that your baby's tummy (abdomen) is facing your abdomen.  Gently massage your breast. With your fingertips, massage from the outer edges of your breast inward toward the nipple. This encourages milk flow. If your milk flows slowly, you may need to continue this action during the feeding.  Support your breast with 4 fingers underneath and your thumb above your nipple (make the letter "C" with your hand). Make sure your fingers are well away from your nipple and your baby's mouth.  Stroke your baby's lips gently with your finger or nipple.  When your baby's mouth is open wide enough, quickly bring your baby to your breast, placing your entire nipple and as much of the areola as possible into your baby's mouth. The areola is the colored area around your nipple. ? More areola should be visible above your baby's upper lip than below the lower lip. ? Your baby's lips should be opened and extended outward (flanged) to ensure an adequate, comfortable latch. ? Your baby's tongue should be between his or her lower gum and your breast.  Make sure that your baby's mouth is correctly positioned around your nipple (latched). Your baby's lips should create a seal on your breast and be turned out (everted).  It is common for your baby to suck about 2-3 minutes in order to start the flow of breast milk. Latching Teaching your baby how  to latch onto your breast properly is very important. An improper latch can cause nipple pain, decreased milk supply, and poor weight gain in your baby. Also, if your baby is not latched onto your nipple properly, he or she may swallow some air during feeding. This can make your baby fussy. Burping your  baby when you switch breasts during the feeding can help to get rid of the air. However, teaching your baby to latch on properly is still the best way to prevent fussiness from swallowing air while breastfeeding. Signs that your baby has successfully latched onto your nipple  Silent tugging or silent sucking, without causing you pain. Infant's lips should be extended outward (flanged).  Swallowing heard between every 3-4 sucks once your milk has started to flow (after your let-down milk reflex occurs).  Muscle movement above and in front of his or her ears while sucking.  Signs that your baby has not successfully latched onto your nipple  Sucking sounds or smacking sounds from your baby while breastfeeding.  Nipple pain.  If you think your baby has not latched on correctly, slip your finger into the corner of your baby's mouth to break the suction and place it between your baby's gums. Attempt to start breastfeeding again. Signs of successful breastfeeding Signs from your baby  Your baby will gradually decrease the number of sucks or will completely stop sucking.  Your baby will fall asleep.  Your baby's body will relax.  Your baby will retain a small amount of milk in his or her mouth.  Your baby will let go of your breast by himself or herself.  Signs from you  Breasts that have increased in firmness, weight, and size 1-3 hours after feeding.  Breasts that are softer immediately after breastfeeding.  Increased milk volume, as well as a change in milk consistency and color by the fifth day of breastfeeding.  Nipples that are not sore, cracked, or bleeding.  Signs that your baby is getting enough milk  Wetting at least 1-2 diapers during the first 24 hours after birth.  Wetting at least 5-6 diapers every 24 hours for the first week after birth. The urine should be clear or pale yellow by the age of 5 days.  Wetting 6-8 diapers every 24 hours as your baby continues to grow  and develop.  At least 3 stools in a 24-hour period by the age of 5 days. The stool should be soft and yellow.  At least 3 stools in a 24-hour period by the age of 7 days. The stool should be seedy and yellow.  No loss of weight greater than 10% of birth weight during the first 3 days of life.  Average weight gain of 4-7 oz (113-198 g) per week after the age of 4 days.  Consistent daily weight gain by the age of 5 days, without weight loss after the age of 2 weeks. After a feeding, your baby may spit up a small amount of milk. This is normal. Breastfeeding frequency and duration Frequent feeding will help you make more milk and can prevent sore nipples and extremely full breasts (breast engorgement). Breastfeed when you feel the need to reduce the fullness of your breasts or when your baby shows signs of hunger. This is called "breastfeeding on demand." Signs that your baby is hungry include:  Increased alertness, activity, or restlessness.  Movement of the head from side to side.  Opening of the mouth when the corner of the mouth or  cheek is stroked (rooting).  Increased sucking sounds, smacking lips, cooing, sighing, or squeaking.  Hand-to-mouth movements and sucking on fingers or hands.  Fussing or crying.  Avoid introducing a pacifier to your baby in the first 4-6 weeks after your baby is born. After this time, you may choose to use a pacifier. Research has shown that pacifier use during the first year of a baby's life decreases the risk of sudden infant death syndrome (SIDS). Allow your baby to feed on each breast as long as he or she wants. When your baby unlatches or falls asleep while feeding from the first breast, offer the second breast. Because newborns are often sleepy in the first few weeks of life, you may need to awaken your baby to get him or her to feed. Breastfeeding times will vary from baby to baby. However, the following rules can serve as a guide to help you make  sure that your baby is properly fed:  Newborns (babies 77 weeks of age or younger) may breastfeed every 1-3 hours.  Newborns should not go without breastfeeding for longer than 3 hours during the day or 5 hours during the night.  You should breastfeed your baby a minimum of 8 times in a 24-hour period.  Breast milk pumping Pumping and storing breast milk allows you to make sure that your baby is exclusively fed your breast milk, even at times when you are unable to breastfeed. This is especially important if you go back to work while you are still breastfeeding, or if you are not able to be present during feedings. Your lactation consultant can help you find a method of pumping that works best for you and give you guidelines about how long it is safe to store breast milk. Caring for your breasts while you breastfeed Nipples can become dry, cracked, and sore while breastfeeding. The following recommendations can help keep your breasts moisturized and healthy:  Avoid using soap on your nipples.  Wear a supportive bra designed especially for nursing. Avoid wearing underwire-style bras or extremely tight bras (sports bras).  Air-dry your nipples for 3-4 minutes after each feeding.  Use only cotton bra pads to absorb leaked breast milk. Leaking of breast milk between feedings is normal.  Use lanolin on your nipples after breastfeeding. Lanolin helps to maintain your skin's normal moisture barrier. Pure lanolin is not harmful (not toxic) to your baby. You may also hand express a few drops of breast milk and gently massage that milk into your nipples and allow the milk to air-dry.  In the first few weeks after giving birth, some women experience breast engorgement. Engorgement can make your breasts feel heavy, warm, and tender to the touch. Engorgement peaks within 3-5 days after you give birth. The following recommendations can help to ease engorgement:  Completely empty your breasts while  breastfeeding or pumping. You may want to start by applying warm, moist heat (in the shower or with warm, water-soaked hand towels) just before feeding or pumping. This increases circulation and helps the milk flow. If your baby does not completely empty your breasts while breastfeeding, pump any extra milk after he or she is finished.  Apply ice packs to your breasts immediately after breastfeeding or pumping, unless this is too uncomfortable for you. To do this: ? Put ice in a plastic bag. ? Place a towel between your skin and the bag. ? Leave the ice on for 20 minutes, 2-3 times a day.  Make sure that your  baby is latched on and positioned properly while breastfeeding.  If engorgement persists after 48 hours of following these recommendations, contact your health care provider or a Advertising copywriter. Overall health care recommendations while breastfeeding  Eat 3 healthy meals and 3 snacks every day. Well-nourished mothers who are breastfeeding need an additional 450-500 calories a day. You can meet this requirement by increasing the amount of a balanced diet that you eat.  Drink enough water to keep your urine pale yellow or clear.  Rest often, relax, and continue to take your prenatal vitamins to prevent fatigue, stress, and low vitamin and mineral levels in your body (nutrient deficiencies).  Do not use any products that contain nicotine or tobacco, such as cigarettes and e-cigarettes. Your baby may be harmed by chemicals from cigarettes that pass into breast milk and exposure to secondhand smoke. If you need help quitting, ask your health care provider.  Avoid alcohol.  Do not use illegal drugs or marijuana.  Talk with your health care provider before taking any medicines. These include over-the-counter and prescription medicines as well as vitamins and herbal supplements. Some medicines that may be harmful to your baby can pass through breast milk.  It is possible to become  pregnant while breastfeeding. If birth control is desired, ask your health care provider about options that will be safe while breastfeeding your baby. Where to find more information: [employer]: www.llli.org Contact a health care provider if:  You feel like you want to stop breastfeeding or have become frustrated with breastfeeding.  Your nipples are cracked or bleeding.  Your breasts are red, tender, or warm.  You have: ? Painful breasts or nipples. ? A swollen area on either breast. ? A fever or chills. ? Nausea or vomiting. ? Drainage other than breast milk from your nipples.  Your breasts do not become full before feedings by the fifth day after you give birth.  You feel sad and depressed.  Your baby is: ? Too sleepy to eat well. ? Having trouble sleeping. ? More than 48 week old and wetting fewer than 6 diapers in a 24-hour period. ? Not gaining weight by 67 days of age.  Your baby has fewer than 3 stools in a 24-hour period.  Your baby's skin or the white parts of his or her eyes become yellow. Get help right away if:  Your baby is overly tired (lethargic) and does not want to wake up and feed.  Your baby develops an unexplained fever. Summary  Breastfeeding offers many health benefits for infant and mothers.  Try to breastfeed your infant when he or she shows early signs of hunger.  Gently tickle or stroke your baby's lips with your finger or nipple to allow the baby to open his or her mouth. Bring the baby to your breast. Make sure that much of the areola is in your baby's mouth. Offer one side and burp the baby before you offer the other side.  Talk with your health care provider or lactation consultant if you have questions or you face problems as you breastfeed. This information is not intended to replace advice given to you by your health care provider. Make sure you discuss any questions you have with your health care provider. Document  Released: 06/12/2005 Document Revised: 07/14/2016 Document Reviewed: 07/14/2016 Elsevier Interactive Patient Education  [employer].

## 2018-01-24 ENCOUNTER — Ambulatory Visit (INDEPENDENT_AMBULATORY_CARE_PROVIDER_SITE_OTHER): Payer: Self-pay | Admitting: Orthopaedic Surgery

## 2018-01-29 ENCOUNTER — Encounter (HOSPITAL_COMMUNITY): Payer: Self-pay

## 2018-02-02 ENCOUNTER — Other Ambulatory Visit: Payer: Self-pay

## 2018-02-02 ENCOUNTER — Inpatient Hospital Stay (HOSPITAL_COMMUNITY)
Admission: AD | Admit: 2018-02-02 | Discharge: 2018-02-02 | Disposition: A | Discharge status: Home / Self Care | Payer: Medicaid Other | Source: Ambulatory Visit | Attending: Family Medicine | Admitting: Family Medicine

## 2018-02-02 ENCOUNTER — Encounter (HOSPITAL_COMMUNITY): Payer: Self-pay

## 2018-02-02 DIAGNOSIS — Z3A19 19 weeks gestation of pregnancy: Secondary | ICD-10-CM | POA: Insufficient documentation

## 2018-02-02 DIAGNOSIS — O26899 Other specified pregnancy related conditions, unspecified trimester: Secondary | ICD-10-CM

## 2018-02-02 DIAGNOSIS — O26892 Other specified pregnancy related conditions, second trimester: Secondary | ICD-10-CM | POA: Insufficient documentation

## 2018-02-02 DIAGNOSIS — M7989 Other specified soft tissue disorders: Secondary | ICD-10-CM | POA: Diagnosis present

## 2018-02-02 DIAGNOSIS — G5600 Carpal tunnel syndrome, unspecified upper limb: Secondary | ICD-10-CM | POA: Diagnosis not present

## 2018-02-02 DIAGNOSIS — O99352 Diseases of the nervous system complicating pregnancy, second trimester: Secondary | ICD-10-CM | POA: Insufficient documentation

## 2018-02-02 DIAGNOSIS — M79671 Pain in right foot: Secondary | ICD-10-CM | POA: Insufficient documentation

## 2018-02-02 DIAGNOSIS — O1202 Gestational edema, second trimester: Secondary | ICD-10-CM

## 2018-02-02 DIAGNOSIS — M79643 Pain in unspecified hand: Secondary | ICD-10-CM | POA: Diagnosis not present

## 2018-02-02 DIAGNOSIS — Z79899 Other long term (current) drug therapy: Secondary | ICD-10-CM | POA: Diagnosis not present

## 2018-02-02 DIAGNOSIS — M79672 Pain in left foot: Secondary | ICD-10-CM | POA: Insufficient documentation

## 2018-02-02 NOTE — MAU Note (Signed)
Pt. States she has pain in her heels and soles of her feet.  Pt. Also reports numbness of hands.  Pt. Denies vag bleeding or dc.

## 2018-02-02 NOTE — MAU Provider Note (Signed)
Patient Roberta Reynolds is a 37 y.o. [id number] At 1655w5d here with complaints of swelling in her legs, pain in her heels and numbness in her hands.   She denies bleeding, discharge, abdominal pain, discharge, LOF or other ob-gyn complaints.  History     CSN: [id number]  Arrival date and time:     None     Chief Complaint  Patient presents with  . Leg Pain  . Hand Pain   HPI Patient noticed that she had swelling in her feet and ankles two days ago. She noticed it after she was standing for a long time; she tried to elevate her feet and that helped a little.  She also endorses heel pain, even at rest. Her heels were hurting her before the swelling started but she cannot remember when.  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     Today she also had some numbness  And tingling in her hands that started today; she found it difficult to drive today.  OB History    Gravida  4   Para  2   Term  2   Preterm  0   AB  1   Living  2     SAB  1   TAB  0   Ectopic  0   Multiple  0   Live Births  2        Obstetric Comments  Unable to tolerate epidural - Needs general anesthesia only        Past Medical History:  Diagnosis Date  . Complication of anesthesia    spinal heachache   . Spinal headache     Past Surgical History:  Procedure Laterality Date  . CESAREAN SECTION      History reviewed. No pertinent family history.  Social History   Tobacco Use  . Smoking status: Never Smoker  . Smokeless tobacco: Never Used  Substance Use Topics  . Alcohol use: No  . Drug use: No    Allergies:  Allergies  Allergen Reactions  . Other Other (See Comments)    Epidural caused Spinal headache     Medications Prior to Admission  Medication Sig Dispense Refill Last Dose  . Prenatal Vit-Fe Fumarate-FA (PRENATAL MULTIVITAMIN) TABS tablet Take 1 tablet by mouth daily at 12 noon.   Taking  . promethazine (PHENERGAN) 12.5 MG suppository Place  1 suppository (12.5 mg total) rectally every 6 (six) hours as needed for nausea or vomiting. 12 each 0 Taking    Review of Systems  Constitutional: Negative.   HENT: Negative.   Respiratory: Negative.   Cardiovascular: Negative.   Gastrointestinal: Negative.   Genitourinary: Negative.   Neurological: Negative.    Physical Exam   Blood pressure 100/61, pulse 80, temperature 98.2 F (36.8 C), temperature source Oral, resp. rate 20, last menstrual period 09/05/2017, SpO2 99 %.  Physical Exam  Constitutional: She is oriented to person, place, and time. She appears well-developed.  HENT:  Head: Normocephalic.  Neck: Normal range of motion.  GI: Soft.  Musculoskeletal: Normal range of motion. She exhibits edema.  Plus 1 non-pitting edema in lower extremities.   Neurological: She is alert and oriented to person, place, and time.  Skin: Skin is warm and dry.  Psychiatric: She has a normal mood and affect.    MAU Course  Procedures  MDM -Reviewed comfor measures; FHR is 145.   Assessment and Plan   1. Swelling of lower extremity  during pregnancy in second trimester   2. Carpal tunnel syndrome during pregnancy    2. Patient stable for discharge with instructions on self-care.   3. Reviewed warning signs and when to return to MAU.   Charlesetta Garibaldi Jlon Betker 02/02/2018, 3:37 PM

## 2018-02-02 NOTE — Discharge Instructions (Signed)
-  patient should not stand for more than 30 minutes. Elevate legs for 15 min every hour.  -Avoid salty foods; drink water -Buy compression socks -Soak in the tub nightly or soak feet in salt water tub at night.     Luna KitchensKathryn Azir Reynolds (midwife)   CARPAL TUNNEL (Nerve Compression Syndrome): Hug    Cross right arm over left and walk hands behind shoulders. Squeeze shoulders and hold position for ___ breaths. Switch arms and repeat. Repeat ___ times, alternating arms. Do ___ times per day.  Copyright  VHI. All rights reserved.  CARPAL TUNNEL (Nerve Compression Syndrome): Horizontal Adductor Stretch (Standing)    Place right hand on wall. Inhaling, turn torso toward left. Hold position for ___ breaths. Repeat ___ times. Repeat with left hand on wall. Do ___ times per day.  Copyright  VHI. All rights reserved.  CARPAL TUNNEL (Nerve Compression Syndrome): [employer]    Bring right arm in front, elbow bent, forearm up. Reach left arm under right and, if possible, bring left fingers into right palm, thumbs facing body. (If not able to bring fingers into palm, just hold position where comfortable.) Hold position for ___ breaths. Switch arms and repeat. Repeat ___ times. Do ___ times per day.  Copyright  VHI. All rights reserved.  Carpal Tunnel Syndrome Carpal tunnel syndrome is a condition that causes pain in your hand and arm. The carpal tunnel is a narrow area that is on the palm side of your wrist. Repeated wrist motion or certain diseases may cause swelling in the tunnel. This swelling can pinch the main nerve in the wrist (median nerve). Follow these instructions at home: If you have a splint:  Wear it as told by your doctor. Remove it only as told by your doctor.  Loosen the splint if your fingers: ? Become numb and tingle. ? Turn blue and cold.  Keep the splint clean and dry. General instructions  Take over-the-counter and prescription medicines only as told by your  doctor.  Rest your wrist from any activity that may be causing your pain. If needed, talk to your employer about changes that can be made in your work, such as getting a wrist pad to use while typing.  If directed, apply ice to the painful area: ? Put ice in a plastic bag. ? Place a towel between your skin and the bag. ? Leave the ice on for 20 minutes, 2-3 times per day.  Keep all follow-up visits as told by your doctor. This is important.  Do any exercises as told by your doctor, physical therapist, or occupational therapist. Contact a doctor if:  You have new symptoms.  Medicine does not help your pain.  Your symptoms get worse. This information is not intended to replace advice given to you by your health care provider. Make sure you discuss any questions you have with your health care provider. Document Released: 06/01/2011 Document Revised: 11/18/2015 Document Reviewed: 10/28/2014 Elsevier Interactive Patient Education  [employer]2018 [employer]

## 2018-02-05 ENCOUNTER — Ambulatory Visit (HOSPITAL_COMMUNITY)
Admission: RE | Admit: 2018-02-05 | Discharge: 2018-02-05 | Disposition: A | Discharge status: Home / Self Care | Payer: Medicaid Other | Source: Ambulatory Visit | Attending: Family Medicine | Admitting: Family Medicine

## 2018-02-05 ENCOUNTER — Other Ambulatory Visit: Payer: Self-pay | Admitting: Family Medicine

## 2018-02-05 DIAGNOSIS — Z3A20 20 weeks gestation of pregnancy: Secondary | ICD-10-CM

## 2018-02-05 DIAGNOSIS — O09522 Supervision of elderly multigravida, second trimester: Secondary | ICD-10-CM

## 2018-02-05 DIAGNOSIS — O34219 Maternal care for unspecified type scar from previous cesarean delivery: Secondary | ICD-10-CM | POA: Diagnosis not present

## 2018-02-05 DIAGNOSIS — O09529 Supervision of elderly multigravida, unspecified trimester: Secondary | ICD-10-CM

## 2018-02-05 DIAGNOSIS — Z363 Encounter for antenatal screening for malformations: Secondary | ICD-10-CM | POA: Diagnosis not present

## 2018-02-06 ENCOUNTER — Ambulatory Visit (INDEPENDENT_AMBULATORY_CARE_PROVIDER_SITE_OTHER): Payer: Medicaid Other | Admitting: Orthopaedic Surgery

## 2018-02-06 ENCOUNTER — Encounter (INDEPENDENT_AMBULATORY_CARE_PROVIDER_SITE_OTHER): Payer: Self-pay | Admitting: Orthopaedic Surgery

## 2018-02-06 DIAGNOSIS — M25561 Pain in right knee: Secondary | ICD-10-CM | POA: Diagnosis not present

## 2018-02-06 DIAGNOSIS — G8929 Other chronic pain: Secondary | ICD-10-CM

## 2018-02-06 MED ORDER — DICLOFENAC SODIUM 1 % TD GEL: 2.0000 g | Freq: Four times a day (QID) | TRANSDERMAL | 5 refills | Status: DC

## 2018-02-06 NOTE — Progress Notes (Signed)
Office Visit Note   Patient: Roberta Reynolds           Date of Birth: 10/09/1980           MRN: [id number] Visit Date: 02/06/2018              Requested by: Reva BoresPratt, Tanya S, MD [address] [place], [place] [id number] PCP: Patient, No Pcp Per   Assessment & Plan: Visit Diagnoses:  1. Chronic pain of right knee     Plan: impression is right knee patella tendinitis.  Recommend tylenol and voltaren gel as needed and to use sparingly.  For low back pain, recommend PT for this and the knee as well.  Follow up as needed.  Follow-Up Instructions: Return if symptoms worsen or fail to improve.   Orders:  No orders of the defined types were placed in this encounter.  Meds ordered this encounter  Medications  . diclofenac sodium (VOLTAREN) 1 % GEL    Sig: Apply 2 g topically 4 (four) times daily.    Dispense:  1 Tube    Refill:  5      Procedures: No procedures performed   Clinical Data: No additional findings.   Subjective: Chief Complaint  Patient presents with  . Right Knee - Pain    Roberta Reynolds is a 37 year old female who is [redacted] weeks pregnant with low back pain and right knee pain.  The back pain has been stable since being pregnant.  Denies radiculopathy or incontinence.  Right knee pain is worse at night when keeping her knee straight.  Denies any injuries, numbness, tingling.    Review of Systems  Constitutional: Negative.   HENT: Negative.   Eyes: Negative.   Respiratory: Negative.   Cardiovascular: Negative.   Endocrine: Negative.   Musculoskeletal: Negative.   Neurological: Negative.   Hematological: Negative.   Psychiatric/Behavioral: Negative.   All other systems reviewed and are negative.    Objective: Vital Signs: LMP 09/05/2017 (Exact Date)   Physical Exam  Constitutional: She is oriented to person, place, and time. She appears well-developed and well-nourished.  HENT:  Head: Normocephalic and atraumatic.  Eyes: EOM are normal.  Neck: Neck  supple.  Pulmonary/Chest: Effort normal.  Abdominal: Soft.  Neurological: She is alert and oriented to person, place, and time.  Skin: Skin is warm. Capillary refill takes less than 2 seconds.  Psychiatric: She has a normal mood and affect. Her behavior is normal. Judgment and thought content normal.  Nursing note and vitals reviewed.   Ortho Exam Right knee exam - no effusion - full ROM - no significant tenderness other than some mild discomfort to patellar tendon  Low back exam - mild tender to lumbar spine - no focal findings Specialty Comments:  No specialty comments available.  Imaging: No results found.   PMFS History: Patient Active Problem List   Diagnosis Date Noted  . Supervision of high-risk pregnancy of multigravida >39 years of age 64/14/2019  . Complication of anesthesia   . History of cesarean section x 2 04/30/2013   Past Medical History:  Diagnosis Date  . Complication of anesthesia    spinal heachache   . Spinal headache     No family history on file.  Past Surgical History:  Procedure Laterality Date  . CESAREAN SECTION     Social History   Occupational History  . Not on file  Tobacco Use  . Smoking status: Never Smoker  . Smokeless tobacco: Never Used  Substance and Sexual Activity  . Alcohol use: No  . Drug use: No  . Sexual activity: Yes    Partners: Male    Birth control/protection: None

## 2018-02-12 ENCOUNTER — Ambulatory Visit (INDEPENDENT_AMBULATORY_CARE_PROVIDER_SITE_OTHER): Payer: Medicaid Other | Admitting: Family Medicine

## 2018-02-12 VITALS — BP 97/62 | HR 72 | Wt 191.1 lb

## 2018-02-12 DIAGNOSIS — O09529 Supervision of elderly multigravida, unspecified trimester: Secondary | ICD-10-CM

## 2018-02-12 DIAGNOSIS — Z23 Encounter for immunization: Secondary | ICD-10-CM

## 2018-02-12 DIAGNOSIS — O09522 Supervision of elderly multigravida, second trimester: Secondary | ICD-10-CM

## 2018-02-12 DIAGNOSIS — O34219 Maternal care for unspecified type scar from previous cesarean delivery: Secondary | ICD-10-CM

## 2018-02-12 DIAGNOSIS — Z98891 History of uterine scar from previous surgery: Secondary | ICD-10-CM

## 2018-02-12 NOTE — Progress Notes (Signed)
   PRENATAL VISIT NOTE  Subjective:  Roberta Reynolds is a 37 y.o. [id number] at 3731w1d being seen today for ongoing prenatal care.  She is currently monitored for the following issues for this high-risk pregnancy and has History of cesarean section x 2; Supervision of high-risk pregnancy of multigravida >39 years of age; and Complication of anesthesia on their problem list.  Patient reports continued knee pain. Did see ortho, not doing anything during pregnancy.  Contractions: Not present.  .  Movement: Present. Denies leaking of fluid.   The following portions of the patient's history were reviewed and updated as appropriate: allergies, current medications, past family history, past medical history, past social history, past surgical history and problem list. Problem list updated.  Objective:   Vitals:   02/12/18 1436  BP: 97/62  Pulse: 72  Weight: 191 lb 1.6 oz (86.7 kg)    Fetal Status: Fetal Heart Rate (bpm): 140 Fundal Height: 20 cm Movement: Present     General:  Alert, oriented and cooperative. Patient is in no acute distress.  Skin: Skin is warm and dry. No rash noted.   Cardiovascular: Normal heart rate noted  Respiratory: Normal respiratory effort, no problems with respiration noted  Abdomen: Soft, gravid, appropriate for gestational age.  Pain/Pressure: Absent     Pelvic: Cervical exam deferred        Extremities: Normal range of motion.  Edema: None  Mental Status: Normal mood and affect. Normal behavior. Normal judgment and thought content.   Assessment and Plan:  Pregnancy: A5W0981G4P2012 at 5331w1d  1. History of cesarean section x 2 Will need RCS  2. Supervision of high-risk pregnancy of multigravida >39 years of age Continue routine prenatal care.  - Flu Vaccine QUAD 36+ mos IM (Fluarix, Quad PF)  General obstetric precautions including but not limited to vaginal bleeding, contractions, leaking of fluid and fetal movement were reviewed in detail with the patient. Please  refer to After Visit Summary for other counseling recommendations.  Return in 4 weeks (on 03/12/2018).  Future Appointments  Date Time Provider Department Center  03/12/2018 11:15 AM Reva BoresPratt, Tanya S, MD [facility]    Reva Boresanya S Pratt, MD

## 2018-02-12 NOTE — Patient Instructions (Signed)

## 2018-03-12 ENCOUNTER — Ambulatory Visit (INDEPENDENT_AMBULATORY_CARE_PROVIDER_SITE_OTHER): Payer: Medicaid Other | Admitting: Family Medicine

## 2018-03-12 VITALS — BP 100/66 | Wt 182.1 lb

## 2018-03-12 DIAGNOSIS — Z98891 History of uterine scar from previous surgery: Secondary | ICD-10-CM

## 2018-03-12 NOTE — Progress Notes (Signed)
   PRENATAL VISIT NOTE  Subjective:  Roberta Reynolds is a 37 y.o. [id number] at 5629w1d being seen today for ongoing prenatal care.  She is currently monitored for the following issues for this low-risk pregnancy and has History of cesarean section x 2; Supervision of high-risk pregnancy of multigravida >39 years of age; and Complication of anesthesia on their problem list.  Patient reports no complaints.  Contractions: Not present.  .  Movement: Present. Denies leaking of fluid.   The following portions of the patient's history were reviewed and updated as appropriate: allergies, current medications, past family history, past medical history, past social history, past surgical history and problem list. Problem list updated.  Objective:   Vitals:   03/12/18 1120  BP: 100/66  Weight: 182 lb 1.6 oz (82.6 kg)    Fetal Status: Fetal Heart Rate (bpm): 154 Fundal Height: 25 cm Movement: Present     General:  Alert, oriented and cooperative. Patient is in no acute distress.  Skin: Skin is warm and dry. No rash noted.   Cardiovascular: Normal heart rate noted  Respiratory: Normal respiratory effort, no problems with respiration noted  Abdomen: Soft, gravid, appropriate for gestational age.  Pain/Pressure: Absent     Pelvic: Cervical exam deferred        Extremities: Normal range of motion.  Edema: None  Mental Status: Normal mood and affect. Normal behavior. Normal judgment and thought content.   Assessment and Plan:  Pregnancy: [id number] at 2929w1d  1. History of cesarean section x 2 For Repeat  2. Supervision of normal pregnancy Continue routine prenatal care. 28 wk labs next visit.  Preterm labor symptoms and general obstetric precautions including but not limited to vaginal bleeding, contractions, leaking of fluid and fetal movement were reviewed in detail with the patient. Please refer to After Visit Summary for other counseling recommendations.  Return in 3 weeks (on 04/02/2018) for 28 wk  labs.  Future Appointments  Date Time Provider Department Center  04/02/2018  9:30 AM [place] BingPickens, Charlie, MD [facility]    Reva Boresanya S Alakai Macbride, MD

## 2018-03-12 NOTE — Patient Instructions (Signed)

## 2018-04-02 ENCOUNTER — Encounter: Payer: Self-pay | Admitting: Obstetrics and Gynecology

## 2018-04-02 ENCOUNTER — Ambulatory Visit (INDEPENDENT_AMBULATORY_CARE_PROVIDER_SITE_OTHER): Payer: Medicaid Other | Admitting: Obstetrics and Gynecology

## 2018-04-02 VITALS — BP 100/68 | HR 72 | Wt 185.2 lb

## 2018-04-02 DIAGNOSIS — Z98891 History of uterine scar from previous surgery: Secondary | ICD-10-CM

## 2018-04-02 DIAGNOSIS — T8859XD Other complications of anesthesia, subsequent encounter: Secondary | ICD-10-CM

## 2018-04-02 DIAGNOSIS — Z3492 Encounter for supervision of normal pregnancy, unspecified, second trimester: Secondary | ICD-10-CM

## 2018-04-02 DIAGNOSIS — T4145XD Adverse effect of unspecified anesthetic, subsequent encounter: Secondary | ICD-10-CM

## 2018-04-02 DIAGNOSIS — R5383 Other fatigue: Secondary | ICD-10-CM

## 2018-04-02 DIAGNOSIS — O09523 Supervision of elderly multigravida, third trimester: Secondary | ICD-10-CM

## 2018-04-02 DIAGNOSIS — O09529 Supervision of elderly multigravida, unspecified trimester: Secondary | ICD-10-CM

## 2018-04-02 NOTE — Addendum Note (Signed)
Addended by: Cheree Ditto, DEMETRICE A on: 04/02/2018 10:36 AM   Modules accepted: Kipp Brood

## 2018-04-02 NOTE — Progress Notes (Signed)
Prenatal Visit Note Date: 04/02/2018 Clinic: [facility]  Subjective:  Roberta Reynolds is a 37 y.o. [id number] at [redacted]w[redacted]d being seen today for ongoing prenatal care.  She is currently monitored for the following issues for this high-risk pregnancy and has History of cesarean section x 2; Supervision of high-risk pregnancy of multigravida >73 years of age; Complication of anesthesia; and AMA (advanced maternal age) multigravida 35+ on their problem list.  Patient reports after eating dinner yesterday she felt tired and really weak. She states it's happened before she got pregnant the first time and has happened during her pregnancies. Also sometimes it's hard for her to catch her breath.     Contractions: Not present. Vag. Bleeding: None.  Movement: Present. Denies leaking of fluid.   The following portions of the patient's history were reviewed and updated as appropriate: allergies, current medications, past family history, past medical history, past social history, past surgical history and problem list. Problem list updated.  Objective:   Vitals:   04/02/18 0852  BP: 100/68  Pulse: 72  Weight: 185 lb 3.2 oz (84 kg)    Fetal Status: Fetal Heart Rate (bpm): 140s Fundal Height: 28 cm Movement: Present     General:  Alert, oriented and cooperative. Patient is in no acute distress.  Skin: Skin is warm and dry. No rash noted.   Cardiovascular: Normal heart rate noted  Respiratory: Normal respiratory effort, no problems with respiration noted  Abdomen: Soft, gravid, appropriate for gestational age. Pain/Pressure: Absent     Pelvic:  Cervical exam deferred        Extremities: Normal range of motion.  Edema: None  Mental Status: Normal mood and affect. Normal behavior. Normal judgment and thought content.   Urinalysis:      Assessment and Plan:  Pregnancy: [name] at [redacted]w[redacted]d  1. Encounter for supervision of normal pregnancy in second trimester, unspecified gravidity 28wk  labs today. D/w pt re: BC nv.  - Glucose Tolerance, 2 Hours w/1 Hour; Future - RPR; Future - CBC; Future - HIV Antibody (routine testing w rflx); Future  2. Supervision of high-risk pregnancy of multigravida >8 years of age - Basic metabolic panel - Magnesium - TSH - EKG 12-Lead  3. History of cesarean section x 2 Schedule c/s nv (see below)  4. Multigravida of advanced maternal age in third trimester No issues  5. Lethargy Will check labs and ECG. D/w her that if continues can consider maternal echo.  - Basic metabolic panel - Magnesium - TSH - EKG 12-Lead  6. Complication of anesthesia, subsequent encounter Pt states she had general anesthesia with first delivery in [place] b/c her doctor there knew about her anxiety and can get very anxious with seeing blood and she states that delivery went fine. She had her 2nd delivery in 12/2012 in [place] (pt unsure of name of hospital) but had some type of regional. She states she had a horrible experience and wants general with this delivery. Pt has some records from [place] and can bring them to [place] so we can request info on that delivery, anesthesia. Once we do, I told her I recommend she have an anesthesia consult to go over with her and make a delivery plan. Pt is amenable to plan  Preterm labor symptoms and general obstetric precautions including but not limited to vaginal bleeding, contractions, leaking of fluid and fetal movement were reviewed in detail with the patient. Please refer to After Visit Summary for other counseling recommendations.  Return  in about 10 days (around 04/12/2018) for with female MD.   [place] Bing, MD

## 2018-04-02 NOTE — Addendum Note (Signed)
Addended by: Cheree Ditto, DEMETRICE A on: 04/02/2018 01:55 PM   Modules accepted: Orders

## 2018-04-03 LAB — BASIC METABOLIC PANEL
BUN/Creatinine Ratio: 15 (ref 9–23)
BUN: 7 mg/dL (ref 6–20)
CALCIUM: 9.2 mg/dL (ref 8.7–10.2)
CO2: 16 mmol/L — AB (ref 20–29)
CREATININE: 0.47 mg/dL — AB (ref 0.57–1.00)
Chloride: 103 mmol/L (ref 96–106)
GFR calc Af Amer: 144 mL/min/{1.73_m2} (ref >59)
GFR calc non Af Amer: 125 mL/min/{1.73_m2} (ref >59)
Glucose: 170 mg/dL — ABNORMAL HIGH (ref 65–99)
Potassium: 3.5 mmol/L (ref 3.5–5.2)
Sodium: 137 mmol/L (ref 134–144)

## 2018-04-03 LAB — TSH: TSH: 0.614 u[IU]/mL (ref 0.450–4.500)

## 2018-04-03 LAB — CBC
HEMOGLOBIN: 10.5 g/dL — AB (ref 11.1–15.9)
Hematocrit: 32.6 % — ABNORMAL LOW (ref 34.0–46.6)
MCH: 24.2 pg — ABNORMAL LOW (ref 26.6–33.0)
MCHC: 32.2 g/dL (ref 31.5–35.7)
MCV: 75 fL — ABNORMAL LOW (ref 79–97)
PLATELETS: 295 10*3/uL (ref 150–450)
RBC: 4.34 x10E6/uL (ref 3.77–5.28)
RDW: 14.7 % (ref 12.3–15.4)
WBC: 9.9 10*3/uL (ref 3.4–10.8)

## 2018-04-03 LAB — RPR: RPR: NONREACTIVE

## 2018-04-03 LAB — HIV ANTIBODY (ROUTINE TESTING W REFLEX): HIV SCREEN 4TH GENERATION: NONREACTIVE

## 2018-04-03 LAB — MAGNESIUM: MAGNESIUM: 1.8 mg/dL (ref 1.6–2.3)

## 2018-04-04 ENCOUNTER — Telehealth: Payer: Self-pay

## 2018-04-04 NOTE — Telephone Encounter (Signed)
Did you want patient to have ECG-12 lead? at [facility]?There was no order in the chart. If so will get it scheduled tomorrow.

## 2018-04-05 ENCOUNTER — Other Ambulatory Visit: Payer: Self-pay

## 2018-04-05 ENCOUNTER — Other Ambulatory Visit: Payer: Medicaid Other

## 2018-04-05 DIAGNOSIS — Z3492 Encounter for supervision of normal pregnancy, unspecified, second trimester: Secondary | ICD-10-CM

## 2018-04-05 DIAGNOSIS — O0992 Supervision of high risk pregnancy, unspecified, second trimester: Secondary | ICD-10-CM

## 2018-04-06 LAB — GLUCOSE TOLERANCE, 2 HOURS W/ 1HR
GLUCOSE, 1 HOUR: 164 mg/dL (ref 65–179)
GLUCOSE, 2 HOUR: 104 mg/dL (ref 65–152)
GLUCOSE, FASTING: 86 mg/dL (ref 65–91)

## 2018-04-08 NOTE — Telephone Encounter (Signed)
Yes, and there is an order in there from when I saw her last.

## 2018-04-08 NOTE — Telephone Encounter (Signed)
Can you please try and get this schedule the order is in the chart under EKG. She would like to go to [employer]

## 2018-04-11 ENCOUNTER — Encounter: Payer: Medicaid Other | Admitting: Obstetrics & Gynecology

## 2018-04-16 ENCOUNTER — Telehealth: Payer: Self-pay | Admitting: Radiology

## 2018-04-16 NOTE — Telephone Encounter (Signed)
Spoke with patient to let her know that she can go to [place] for walk-in clinic for EKG. She states understanding and expressed that she would do so.

## 2018-04-18 ENCOUNTER — Ambulatory Visit (INDEPENDENT_AMBULATORY_CARE_PROVIDER_SITE_OTHER): Payer: Medicaid Other | Admitting: Obstetrics and Gynecology

## 2018-04-18 ENCOUNTER — Encounter: Payer: Self-pay | Admitting: Obstetrics and Gynecology

## 2018-04-18 ENCOUNTER — Encounter (HOSPITAL_COMMUNITY): Payer: Self-pay

## 2018-04-18 VITALS — BP 102/66 | HR 109 | Wt 187.0 lb

## 2018-04-18 DIAGNOSIS — R0600 Dyspnea, unspecified: Secondary | ICD-10-CM

## 2018-04-18 DIAGNOSIS — T4145XS Adverse effect of unspecified anesthetic, sequela: Secondary | ICD-10-CM

## 2018-04-18 DIAGNOSIS — O09529 Supervision of elderly multigravida, unspecified trimester: Secondary | ICD-10-CM

## 2018-04-18 DIAGNOSIS — Z98891 History of uterine scar from previous surgery: Secondary | ICD-10-CM

## 2018-04-18 DIAGNOSIS — T8859XS Other complications of anesthesia, sequela: Secondary | ICD-10-CM

## 2018-04-18 NOTE — Progress Notes (Signed)
Pt still having trouble breathing, has EKG scheduled on 10/29. Has issues with vomiting if she eats bigger meals.

## 2018-04-22 ENCOUNTER — Encounter: Payer: Self-pay | Admitting: Radiology

## 2018-04-22 NOTE — Progress Notes (Signed)
Prenatal Visit Note Date: 04/18/2018 Clinic: [facility]  Subjective:  Roberta Reynolds is a 37 y.o. [id number] at [redacted]w[redacted]d being seen today for ongoing prenatal care.  She is currently monitored for the following issues for this low-risk pregnancy and has History of cesarean section x 2; Supervision of high-risk pregnancy of multigravida >59 years of age; Complication of anesthesia; and AMA (advanced maternal age) multigravida 35+ on their problem list.  Patient reports early satiety and feels tired and SOB at night   Contractions: Not present. Vag. Bleeding: None.  Movement: Present. Denies leaking of fluid.   The following portions of the patient's history were reviewed and updated as appropriate: allergies, current medications, past family history, past medical history, past social history, past surgical history and problem list. Problem list updated.  Objective:   Vitals:   04/18/18 1019  BP: 102/66  Pulse: (!) 109  Weight: 187 lb (84.8 kg)    Fetal Status: Fetal Heart Rate (bpm): 160 Fundal Height: 31 cm Movement: Present     General:  Alert, oriented and cooperative. Patient is in no acute distress.  Skin: Skin is warm and dry. No rash noted.   Cardiovascular: Normal heart rate noted  Respiratory: Normal respiratory effort, no problems with respiration noted  Abdomen: Soft, gravid, appropriate for gestational age. Pain/Pressure: Absent     Pelvic:  Cervical exam deferred        Extremities: Normal range of motion.  Edema: None  Mental Status: Normal mood and affect. Normal behavior. Normal judgment and thought content.   Urinalysis:      Assessment and Plan:  Pregnancy: [id number] at [redacted]w[redacted]d  1. Dyspnea, unspecified type Pt for EKG this week and will order maternal echo for reassurance. Low suspicion for concern - ECHOCARDIOGRAM COMPLETE; Future  2. Supervision of high-risk pregnancy of multigravida >62 years of age D/w pt more re: BC nv. Pt not interested in  BTL right now  3. History of cesarean section x 2 Request sent for rpt c/s with me  4. Complication of anesthesia, sequela ROI for records from [place] and once we get them and echo is done then can set up for anesthesia consult  5. Antepartum multigravida of advanced maternal age  Preterm labor symptoms and general obstetric precautions including but not limited to vaginal bleeding, contractions, leaking of fluid and fetal movement were reviewed in detail with the patient. Please refer to After Visit Summary for other counseling recommendations.  Return in about 2 weeks (around 05/02/2018) for rob with dr Vergie Living .   [place] Bing, MD

## 2018-04-23 ENCOUNTER — Ambulatory Visit
Admission: RE | Admit: 2018-04-23 | Discharge: 2018-04-23 | Disposition: A | Discharge status: Home / Self Care | Payer: Medicaid Other | Source: Ambulatory Visit | Attending: Obstetrics and Gynecology | Admitting: Obstetrics and Gynecology

## 2018-04-23 DIAGNOSIS — O09529 Supervision of elderly multigravida, unspecified trimester: Secondary | ICD-10-CM | POA: Diagnosis not present

## 2018-04-23 DIAGNOSIS — O26899 Other specified pregnancy related conditions, unspecified trimester: Secondary | ICD-10-CM | POA: Diagnosis not present

## 2018-04-23 DIAGNOSIS — Z3A00 Weeks of gestation of pregnancy not specified: Secondary | ICD-10-CM | POA: Diagnosis not present

## 2018-04-23 DIAGNOSIS — R5383 Other fatigue: Secondary | ICD-10-CM | POA: Diagnosis not present

## 2018-04-23 DIAGNOSIS — R0600 Dyspnea, unspecified: Secondary | ICD-10-CM

## 2018-04-25 ENCOUNTER — Encounter: Payer: Self-pay | Admitting: Obstetrics and Gynecology

## 2018-04-25 ENCOUNTER — Encounter: Payer: Self-pay | Admitting: Radiology

## 2018-04-25 ENCOUNTER — Other Ambulatory Visit: Payer: Self-pay | Admitting: Obstetrics and Gynecology

## 2018-04-25 DIAGNOSIS — R5383 Other fatigue: Secondary | ICD-10-CM | POA: Insufficient documentation

## 2018-05-02 ENCOUNTER — Encounter: Payer: Medicaid Other | Admitting: Obstetrics & Gynecology

## 2018-05-02 ENCOUNTER — Ambulatory Visit (INDEPENDENT_AMBULATORY_CARE_PROVIDER_SITE_OTHER): Payer: Medicaid Other | Admitting: Obstetrics & Gynecology

## 2018-05-02 VITALS — BP 111/71 | HR 109 | Wt 187.0 lb

## 2018-05-02 DIAGNOSIS — R079 Chest pain, unspecified: Secondary | ICD-10-CM

## 2018-05-02 DIAGNOSIS — R0600 Dyspnea, unspecified: Secondary | ICD-10-CM

## 2018-05-02 DIAGNOSIS — O09529 Supervision of elderly multigravida, unspecified trimester: Secondary | ICD-10-CM

## 2018-05-02 DIAGNOSIS — Z98891 History of uterine scar from previous surgery: Secondary | ICD-10-CM

## 2018-05-02 NOTE — Progress Notes (Signed)
   PRENATAL VISIT NOTE  Subjective:  Roberta Reynolds is a 37 y.o. Z6X0960 at [redacted]w[redacted]d being seen today for ongoing prenatal care.  She is currently monitored for the following issues for this high-risk pregnancy and has History of cesarean section x 2; Supervision of high-risk pregnancy of multigravida >53 years of age; Complication of anesthesia; AMA (advanced maternal age) multigravida 35+; and Lethargy on their problem list.  Patient reports worsening dyspnea and episodic "squeezing chest pain". No chest pain with breathing. Went for scheduled ECHO today, but was inadvertently sent for fetal ECHO. She informed them it was the wrong study but they insisted and still did the fetal ECHO which was normal. She is upset that her ECHO was not done. Had normal EKG as ordered by Dr. Vergie Reynolds, just wants to get her own ECHO and make sure her heart is okay as she is really concerned about this.  Contractions: Not present. Vag. Bleeding: None.  Movement: Present. Denies leaking of fluid.   The following portions of the patient's history were reviewed and updated as appropriate: allergies, current medications, past family history, past medical history, past social history, past surgical history and problem list. Problem list updated.  Objective:   Vitals:   05/02/18 1515  BP: 111/71  Pulse: (!) 109  Weight: 187 lb (84.8 kg)    Fetal Status: Fetal Heart Rate (bpm): 145 Fundal Height: 32 cm Movement: Present     General:  Alert, oriented and cooperative. Patient is in no acute distress.  Skin: Skin is warm and dry. No rash noted.   Cardiovascular: Normal heart rate noted  Respiratory: Normal respiratory effort, no problems with respiration noted  Abdomen: Soft, gravid, appropriate for gestational age.  Pain/Pressure: Absent     Pelvic: Cervical exam deferred        Extremities: Normal range of motion.  Edema: None  Mental Status: Normal mood and affect. Normal behavior. Normal judgment and thought  content.   Assessment and Plan:  Pregnancy: [name] at [redacted]w[redacted]d  1. Dyspnea, unspecified type 2. Chest pain, unspecified type Patient's adult ECHO was scheduled on 05/10/2018 [facility], will follow up results and manage accordingly.  Told to go to ER/MAU for any worsening symptoms.  3. History of cesarean section x 2 Will get RCS  4. Supervision of high-risk pregnancy of multigravida >51 years of age Preterm labor symptoms and general obstetric precautions including but not limited to vaginal bleeding, contractions, leaking of fluid and fetal movement were reviewed in detail with the patient. Please refer to After Visit Summary for other counseling recommendations.  Return in about 2 weeks (around 05/16/2018) for OB Visit.  Future Appointments  Date Time Provider Department Center  05/10/2018 11:00 AM [facility] None  05/16/2018  1:45 PM Roberta Bing, MD [facility]    Roberta Collins, MD

## 2018-05-10 ENCOUNTER — Ambulatory Visit: Admission: RE | Admit: 2018-05-10 | Payer: Medicaid Other | Source: Ambulatory Visit

## 2018-05-14 ENCOUNTER — Ambulatory Visit
Admission: RE | Admit: 2018-05-14 | Discharge: 2018-05-14 | Disposition: A | Discharge status: Home / Self Care | Payer: Medicaid Other | Source: Ambulatory Visit | Attending: Obstetrics and Gynecology | Admitting: Obstetrics and Gynecology

## 2018-05-14 DIAGNOSIS — R0600 Dyspnea, unspecified: Secondary | ICD-10-CM | POA: Insufficient documentation

## 2018-05-14 DIAGNOSIS — G971 Other reaction to spinal and lumbar puncture: Secondary | ICD-10-CM | POA: Diagnosis not present

## 2018-05-14 NOTE — Progress Notes (Signed)
*  PRELIMINARY RESULTS* Echocardiogram 2D Echocardiogram has been performed.  Cristela BlueHege, Ritik Stavola 05/14/2018, 12:29 PM

## 2018-05-16 ENCOUNTER — Ambulatory Visit (INDEPENDENT_AMBULATORY_CARE_PROVIDER_SITE_OTHER): Payer: Medicaid Other | Admitting: Obstetrics and Gynecology

## 2018-05-16 VITALS — BP 93/60 | HR 104 | Wt 186.7 lb

## 2018-05-16 DIAGNOSIS — O09529 Supervision of elderly multigravida, unspecified trimester: Secondary | ICD-10-CM

## 2018-05-16 DIAGNOSIS — T4145XS Adverse effect of unspecified anesthetic, sequela: Secondary | ICD-10-CM

## 2018-05-16 DIAGNOSIS — T8859XS Other complications of anesthesia, sequela: Secondary | ICD-10-CM

## 2018-05-16 DIAGNOSIS — Z98891 History of uterine scar from previous surgery: Secondary | ICD-10-CM

## 2018-05-16 DIAGNOSIS — O09523 Supervision of elderly multigravida, third trimester: Secondary | ICD-10-CM

## 2018-05-16 DIAGNOSIS — R5383 Other fatigue: Secondary | ICD-10-CM

## 2018-05-16 NOTE — Progress Notes (Signed)
Prenatal Visit Note Date: 05/16/2018 Clinic: [facility]  Subjective:  Roberta Reynolds is a 37 y.o. [id number] at 9576w3d being seen today for ongoing prenatal care.  She is currently monitored for the following issues for this high-risk pregnancy and has History of cesarean section x 2; Supervision of high-risk pregnancy of multigravida >39 years of age; Complication of anesthesia; AMA (advanced maternal age) multigravida 35+; and Lethargy on their problem list.  Patient reports still lethargy at night Contractions: Not present. Vag. Bleeding: None.  Movement: Present. Denies leaking of fluid.   The following portions of the patient's history were reviewed and updated as appropriate: allergies, current medications, past family history, past medical history, past social history, past surgical history and problem list. Problem list updated.  Objective:   Vitals:   05/16/18 1401  BP: 93/60  Pulse: (!) 104  Weight: 186 lb 11.2 oz (84.7 kg)    Fetal Status: Fetal Heart Rate (bpm): 135 Fundal Height: 34 cm Movement: Present     General:  Alert, oriented and cooperative. Patient is in no acute distress.  Skin: Skin is warm and dry. No rash noted.   Cardiovascular: Normal heart rate noted  Respiratory: Normal respiratory effort, no problems with respiration noted  Abdomen: Soft, gravid, appropriate for gestational age. Pain/Pressure: Absent     Pelvic:  Cervical exam deferred        Extremities: Normal range of motion.  Edema: None  Mental Status: Normal mood and affect. Normal behavior. Normal judgment and thought content.   Urinalysis:      Assessment and Plan:  Pregnancy: A5W0981G4P2012 at 4376w3d  1. Supervision of high-risk pregnancy of multigravida >39 years of age Routine care. OCPs  2. Lethargy Negative maternal echo. Likely benign, pregnancy related. Follow up PP  3. History of cesarean section x 2 Scheduled already.  4. Complication of anesthesia, sequela Will  set up anesthesia appt as pt had bad experience with regional with 2nd c/s and 1st one she had general straight off and she liked that   5. Multigravida of advanced maternal age in third trimester No current issues. Measuring normal  Preterm labor symptoms and general obstetric precautions including but not limited to vaginal bleeding, contractions, leaking of fluid and fetal movement were reviewed in detail with the patient. Please refer to After Visit Summary for other counseling recommendations.  Return in about 6 days (around 05/22/2018) for rob.   [name], Roberta Glazier, MD

## 2018-05-22 ENCOUNTER — Ambulatory Visit (INDEPENDENT_AMBULATORY_CARE_PROVIDER_SITE_OTHER): Payer: Medicaid Other | Admitting: Advanced Practice Midwife

## 2018-05-22 VITALS — BP 101/66 | HR 72 | Wt 188.8 lb

## 2018-05-22 DIAGNOSIS — O09523 Supervision of elderly multigravida, third trimester: Secondary | ICD-10-CM

## 2018-05-22 DIAGNOSIS — Z3A35 35 weeks gestation of pregnancy: Secondary | ICD-10-CM

## 2018-05-22 DIAGNOSIS — O09529 Supervision of elderly multigravida, unspecified trimester: Secondary | ICD-10-CM

## 2018-05-22 DIAGNOSIS — F40298 Other specified phobia: Secondary | ICD-10-CM | POA: Insufficient documentation

## 2018-05-22 NOTE — Progress Notes (Signed)
   PRENATAL VISIT NOTE  Subjective:  Roberta Reynolds is a 37 y.o. [id number] at 833w2d being seen today for ongoing prenatal care.  She is currently monitored for the following issues for this high-risk pregnancy and has History of cesarean section x 2; Supervision of high-risk pregnancy of multigravida >39 years of age; Complication of anesthesia; AMA (advanced maternal age) multigravida 35+; Lethargy; and Needle phobia on their problem list.  Patient reports no complaints.  Contractions: Not present. Vag. Bleeding: None.  Movement: Present. Denies leaking of fluid.   The following portions of the patient's history were reviewed and updated as appropriate: allergies, current medications, past family history, past medical history, past social history, past surgical history and problem list. Problem list updated.  Objective:   Vitals:   05/22/18 1114  BP: 101/66  Pulse: 72  Weight: 188 lb 12.8 oz (85.6 kg)    Fetal Status: Fetal Heart Rate (bpm): 161 Fundal Height: 35 cm Movement: Present  Presentation: Vertex  General:  Alert, oriented and cooperative. Patient is in no acute distress.  Skin: Skin is warm and dry. No rash noted.   Cardiovascular: Normal heart rate noted  Respiratory: Normal respiratory effort, no problems with respiration noted  Abdomen: Soft, gravid, appropriate for gestational age.  Pain/Pressure: Absent     Pelvic: Cervical exam deferred        Extremities: Normal range of motion.  Edema: None  Mental Status: Normal mood and affect. Normal behavior. Normal judgment and thought content.   Assessment and Plan:  Pregnancy: [id number] at 5333w2d  1. Supervision of high-risk pregnancy of multigravida >39 years of age --No complaints of concerns today, continue routine care --Discussed milestones for remaining visits  2. Needle phobia --Patient is extremely nervous about labs collection and IV start for repeat LTCS on 06/18/18 --Endorses consistent fainting with IV starts  and at sight of blood regardless of context  Preterm labor symptoms and general obstetric precautions including but not limited to vaginal bleeding, contractions, leaking of fluid and fetal movement were reviewed in detail with the patient. Please refer to After Visit Summary for other counseling recommendations.    Future Appointments  Date Time Provider Department Center  05/29/2018 10:15 AM Federico FlakeNewton, Kimberly Niles, MD [facility]  06/05/2018 10:15 AM [place] CantorWeinhold, Samantha C, CNM [facility]  06/12/2018 10:15 AM Federico FlakeNewton, Kimberly Niles, MD [facility]    Calvert CantorSamantha C Weinhold, CNM

## 2018-05-22 NOTE — Patient Instructions (Signed)
Vaginal Delivery Vaginal delivery means that you will give birth by pushing your baby out of your birth canal (vagina). A team of health care providers will help you before, during, and after vaginal delivery. Birth experiences are unique for every woman and every pregnancy, and birth experiences vary depending on where you choose to give birth. What should I do to prepare for my baby's birth? Before your baby is born, it is important to talk with your health care provider about:  Your labor and delivery preferences. These may include: ? Medicines that you may be given. ? How you will manage your pain. This might include non-medical pain relief techniques or injectable pain relief such as epidural analgesia. ? How you and your baby will be monitored during labor and delivery. ? Who may be in the labor and delivery room with you. ? Your feelings about surgical delivery of your baby (cesarean delivery, or C-section) if this becomes necessary. ? Your feelings about receiving donated blood through an IV tube (blood transfusion) if this becomes necessary.  Whether you are able: ? To take pictures or videos of the birth. ? To eat during labor and delivery. ? To move around, walk, or change positions during labor and delivery.  What to expect after your baby is born, such as: ? Whether delayed umbilical cord clamping and cutting is offered. ? Who will care for your baby right after birth. ? Medicines or tests that may be recommended for your baby. ? Whether breastfeeding is supported in your hospital or birth center. ? How long you will be in the hospital or birth center.  How any medical conditions you have may affect your baby or your labor and delivery experience.  To prepare for your baby's birth, you should also:  Attend all of your health care visits before delivery (prenatal visits) as recommended by your health care provider. This is important.  Prepare your home for your baby's  arrival. Make sure that you have: ? Diapers. ? Baby clothing. ? Feeding equipment. ? Safe sleeping arrangements for you and your baby.  Install a car seat in your vehicle. Have your car seat checked by a certified car seat installer to make sure that it is installed safely.  Think about who will help you with your new baby at home for at least the first several weeks after delivery.  What can I expect when I arrive at the birth center or hospital? Once you are in labor and have been admitted into the hospital or birth center, your health care provider may:  Review your pregnancy history and any concerns you have.  Insert an IV tube into one of your veins. This is used to give you fluids and medicines.  Check your blood pressure, pulse, temperature, and heart rate (vital signs).  Check whether your bag of water (amniotic sac) has broken (ruptured).  Talk with you about your birth plan and discuss pain control options.  Monitoring Your health care provider may monitor your contractions (uterine monitoring) and your baby's heart rate (fetal monitoring). You may need to be monitored:  Often, but not continuously (intermittently).  All the time or for long periods at a time (continuously). Continuous monitoring may be needed if: ? You are taking certain medicines, such as medicine to relieve pain or make your contractions stronger. ? You have pregnancy or labor complications.  Monitoring may be done by:  Placing a special stethoscope or a handheld monitoring device on your abdomen to   check your baby's heartbeat, and feeling your abdomen for contractions. This method of monitoring does not continuously record your baby's heartbeat or your contractions.  Placing monitors on your abdomen (external monitors) to record your baby's heartbeat and the frequency and length of contractions. You may not have to wear external monitors all the time.  Placing monitors inside of your uterus  (internal monitors) to record your baby's heartbeat and the frequency, length, and strength of your contractions. ? Your health care provider may use internal monitors if he or she needs more information about the strength of your contractions or your baby's heart rate. ? Internal monitors are put in place by passing a thin, flexible wire through your vagina and into your uterus. Depending on the type of monitor, it may remain in your uterus or on your baby's head until birth. ? Your health care provider will discuss the benefits and risks of internal monitoring with you and will ask for your permission before inserting the monitors.  Telemetry. This is a type of continuous monitoring that can be done with external or internal monitors. Instead of having to stay in bed, you are able to move around during telemetry. Ask your health care provider if telemetry is an option for you.  Physical exam Your health care provider may perform a physical exam. This may include:  Checking whether your baby is positioned: ? With the head toward your vagina (head-down). This is most common. ? With the head toward the top of your uterus (head-up or breech). If your baby is in a breech position, your health care provider may try to turn your baby to a head-down position so you can deliver vaginally. If it does not seem that your baby can be born vaginally, your provider may recommend surgery to deliver your baby. In rare cases, you may be able to deliver vaginally if your baby is head-up (breech delivery). ? Lying sideways (transverse). Babies that are lying sideways cannot be delivered vaginally.  Checking your cervix to determine: ? Whether it is thinning out (effacing). ? Whether it is opening up (dilating). ? How low your baby has moved into your birth canal.  What are the three stages of labor and delivery?  Normal labor and delivery is divided into the following three stages: Stage 1  Stage 1 is the  longest stage of labor, and it can last for hours or days. Stage 1 includes: ? Early labor. This is when contractions may be irregular, or regular and mild. Generally, early labor contractions are more than 10 minutes apart. ? Active labor. This is when contractions get longer, more regular, more frequent, and more intense. ? The transition phase. This is when contractions happen very close together, are very intense, and may last longer than during any other part of labor.  Contractions generally feel mild, infrequent, and irregular at first. They get stronger, more frequent (about every 2-3 minutes), and more regular as you progress from early labor through active labor and transition.  Many women progress through stage 1 naturally, but you may need help to continue making progress. If this happens, your health care provider may talk with you about: ? Rupturing your amniotic sac if it has not ruptured yet. ? Giving you medicine to help make your contractions stronger and more frequent.  Stage 1 ends when your cervix is completely dilated to 4 inches (10 cm) and completely effaced. This happens at the end of the transition phase. Stage 2  Once   your cervix is completely effaced and dilated to 4 inches (10 cm), you may start to feel an urge to push. It is common for the body to naturally take a rest before feeling the urge to push, especially if you received an epidural or certain other pain medicines. This rest period may last for up to 1-2 hours, depending on your unique labor experience.  During stage 2, contractions are generally less painful, because pushing helps relieve contraction pain. Instead of contraction pain, you may feel stretching and burning pain, especially when the widest part of your baby's head passes through the vaginal opening (crowning).  Your health care provider will closely monitor your pushing progress and your baby's progress through the vagina during stage 2.  Your  health care provider may massage the area of skin between your vaginal opening and anus (perineum) or apply warm compresses to your perineum. This helps it stretch as the baby's head starts to crown, which can help prevent perineal tearing. ? In some cases, an incision may be made in your perineum (episiotomy) to allow the baby to pass through the vaginal opening. An episiotomy helps to make the opening of the vagina larger to allow more room for the baby to fit through.  It is very important to breathe and focus so your health care provider can control the delivery of your baby's head. Your health care provider may have you decrease the intensity of your pushing, to help prevent perineal tearing.  After delivery of your baby's head, the shoulders and the rest of the body generally deliver very quickly and without difficulty.  Once your baby is delivered, the umbilical cord may be cut right away, or this may be delayed for 1-2 minutes, depending on your baby's health. This may vary among health care providers, hospitals, and birth centers.  If you and your baby are healthy enough, your baby may be placed on your chest or abdomen to help maintain the baby's temperature and to help you bond with each other. Some mothers and babies start breastfeeding at this time. Your health care team will dry your baby and help keep your baby warm during this time.  Your baby may need immediate care if he or she: ? Showed signs of distress during labor. ? Has a medical condition. ? Was born too early (prematurely). ? Had a bowel movement before birth (meconium). ? Shows signs of difficulty transitioning from being inside the uterus to being outside of the uterus. If you are planning to breastfeed, your health care team will help you begin a feeding. Stage 3  The third stage of labor starts immediately after the birth of your baby and ends after you deliver the placenta. The placenta is an organ that develops  during pregnancy to provide oxygen and nutrients to your baby in the womb.  Delivering the placenta may require some pushing, and you may have mild contractions. Breastfeeding can stimulate contractions to help you deliver the placenta.  After the placenta is delivered, your uterus should tighten (contract) and become firm. This helps to stop bleeding in your uterus. To help your uterus contract and to control bleeding, your health care provider may: ? Give you medicine by injection, through an IV tube, by mouth, or through your rectum (rectally). ? Massage your abdomen or perform a vaginal exam to remove any blood clots that are left in your uterus. ? Empty your bladder by placing a thin, flexible tube (catheter) into your bladder. ? Encourage   you to breastfeed your baby. After labor is over, you and your baby will be monitored closely to ensure that you are both healthy until you are ready to go home. Your health care team will teach you how to care for yourself and your baby. This information is not intended to replace advice given to you by your health care provider. Make sure you discuss any questions you have with your health care provider. Document Released: 03/21/2008 Document Revised: 12/31/2015 Document Reviewed: 06/27/2015 Elsevier Interactive Patient Education  2018 [employer]  

## 2018-05-29 ENCOUNTER — Ambulatory Visit (INDEPENDENT_AMBULATORY_CARE_PROVIDER_SITE_OTHER): Payer: Medicaid Other | Admitting: Family Medicine

## 2018-05-29 ENCOUNTER — Other Ambulatory Visit (HOSPITAL_COMMUNITY)
Admission: RE | Admit: 2018-05-29 | Discharge: 2018-05-29 | Disposition: A | Discharge status: Home / Self Care | Payer: Medicaid Other | Source: Ambulatory Visit | Attending: Family Medicine | Admitting: Family Medicine

## 2018-05-29 ENCOUNTER — Encounter: Payer: Self-pay | Admitting: Family Medicine

## 2018-05-29 VITALS — BP 98/64 | HR 72 | Wt 186.4 lb

## 2018-05-29 DIAGNOSIS — O0993 Supervision of high risk pregnancy, unspecified, third trimester: Secondary | ICD-10-CM

## 2018-05-29 DIAGNOSIS — O0990 Supervision of high risk pregnancy, unspecified, unspecified trimester: Secondary | ICD-10-CM | POA: Diagnosis present

## 2018-05-29 DIAGNOSIS — O219 Vomiting of pregnancy, unspecified: Secondary | ICD-10-CM

## 2018-05-29 DIAGNOSIS — Z3A36 36 weeks gestation of pregnancy: Secondary | ICD-10-CM

## 2018-05-29 NOTE — Progress Notes (Signed)
   PRENATAL VISIT NOTE  Subjective:  Roberta GarinMarwa Reynolds is a 37 y.o. [id number] at 2183w2d being seen today for ongoing prenatal care.  She is currently monitored for the following issues for this high-risk pregnancy and has History of cesarean section x 2; Supervision of high-risk pregnancy of multigravida >39 years of age; Complication of anesthesia; AMA (advanced maternal age) multigravida 35+; Lethargy; Needle phobia; and Nausea and vomiting of pregnancy, antepartum on their problem list.  Patient reports no complaints.  Contractions: Not present.  .  Movement: Present. Denies leaking of fluid.   The following portions of the patient's history were reviewed and updated as appropriate: allergies, current medications, past family history, past medical history, past social history, past surgical history and problem list. Problem list updated.  Objective:   Vitals:   05/29/18 1018  BP: 98/64  Pulse: 72  Weight: 186 lb 6.4 oz (84.6 kg)    Fetal Status:     Movement: Present     General:  Alert, oriented and cooperative. Patient is in no acute distress.  Skin: Skin is warm and dry. No rash noted.   Cardiovascular: Normal heart rate noted  Respiratory: Normal respiratory effort, no problems with respiration noted  Abdomen: Soft, gravid, appropriate for gestational age.  Pain/Pressure: Present     Pelvic: Cervical exam deferred        Extremities: Normal range of motion.  Edema: None  Mental Status: Normal mood and affect. Normal behavior. Normal judgment and thought content.   Assessment and Plan:  Pregnancy: [id number] at 2383w2d  1. Supervision of high risk pregnancy, antepartum Up to date Having severe GERD symptoms, recommended TUMs Has scheduled CS at 39wks - Culture, beta strep (group b only) - GC/Chlamydia probe amp ([place])[facility]  Preterm labor symptoms and general obstetric precautions including but not limited to vaginal bleeding, contractions, leaking of fluid and fetal  movement were reviewed in detail with the patient. Please refer to After Visit Summary for other counseling recommendations.   Return in about 1 week (around 06/05/2018) for Routine prenatal care.  Future Appointments  Date Time Provider Department Center  06/05/2018 10:15 AM Calvert CantorWeinhold, Samantha C, CNM [facility]  06/12/2018 10:15 AM Federico FlakeNewton, Gracee Ratterree Niles, MD [facility]    Federico FlakeKimberly Niles Fatuma Dowers, MD

## 2018-05-29 NOTE — Progress Notes (Signed)
Patient has been vomiting on/off since last visit.She has lost 2 pounds.

## 2018-05-30 ENCOUNTER — Encounter: Payer: Self-pay | Admitting: Obstetrics and Gynecology

## 2018-05-30 LAB — GC/CHLAMYDIA PROBE AMP (~~LOC~~) NOT AT ARMC
Chlamydia: NEGATIVE
Neisseria Gonorrhea: NEGATIVE

## 2018-05-30 NOTE — Progress Notes (Signed)
OB Note D/w Dr. Hyacinth MeekerMiller of anesthesia re: patient's experiences with anesthesia during her prior two c-sections. [place] had general and had no issues and loved the experience. [place] last [place] and had a horrible experience and prefers general for her surgery on 12/24. D/w her previously that regional is preferred for maternal and fetal reasons but also I want her to be able to complete the surgery. D/w Dr. Hyacinth MeekerMiller and will pass along to colleagues but as long as patient knows the risk associated with general that it should be fine.  Roberta Reynolds, Jr MD Attending Center for [facility]) 05/30/2018 Time: [phone]

## 2018-06-02 LAB — CULTURE, BETA STREP (GROUP B ONLY): STREP GP B CULTURE: NEGATIVE

## 2018-06-03 ENCOUNTER — Encounter (HOSPITAL_COMMUNITY): Payer: Self-pay

## 2018-06-05 ENCOUNTER — Ambulatory Visit (INDEPENDENT_AMBULATORY_CARE_PROVIDER_SITE_OTHER): Payer: Medicaid Other | Admitting: Advanced Practice Midwife

## 2018-06-05 VITALS — BP 100/65 | HR 72 | Wt 189.4 lb

## 2018-06-05 DIAGNOSIS — O0993 Supervision of high risk pregnancy, unspecified, third trimester: Secondary | ICD-10-CM

## 2018-06-05 DIAGNOSIS — O0990 Supervision of high risk pregnancy, unspecified, unspecified trimester: Secondary | ICD-10-CM

## 2018-06-05 NOTE — Progress Notes (Signed)
   PRENATAL VISIT NOTE  Subjective:  Roberta Reynolds is a 37 y.o. [id number] at 3843w2d being seen today for ongoing prenatal care.  She is currently monitored for the following issues for this high-risk pregnancy and has History of cesarean section x 2; Supervision of high-risk pregnancy of multigravida >39 years of age; Complication of anesthesia; AMA (advanced maternal age) multigravida 35+; Lethargy; Needle phobia; and Nausea and vomiting of pregnancy, antepartum on their problem list.  Patient reports sciatic nerve pain when walking, specific to left side.  Contractions: Not present.  .  Movement: Present. Denies leaking of fluid.   The following portions of the patient's history were reviewed and updated as appropriate: allergies, current medications, past family history, past medical history, past social history, past surgical history and problem list. Problem list updated.  Objective:   Vitals:   06/05/18 1033  BP: 100/65  Pulse: 72  Weight: 189 lb 6.4 oz (85.9 kg)    Fetal Status: Fetal Heart Rate (bpm): 152 Fundal Height: 38 cm Movement: Present  Presentation: Vertex  General:  Alert, oriented and cooperative. Patient is in no acute distress.  Skin: Skin is warm and dry. No rash noted.   Cardiovascular: Normal heart rate noted  Respiratory: Normal respiratory effort, no problems with respiration noted  Abdomen: Soft, gravid, appropriate for gestational age.  Pain/Pressure: Present     Pelvic: Cervical exam deferred        Extremities: Normal range of motion.  Edema: None  Mental Status: Normal mood and affect. Normal behavior. Normal judgment and thought content.   Assessment and Plan:  Pregnancy: [id number] at 1943w2d  1. Supervision of High Risk Pregnancy -- Continue routine care --Repeat LTCS scheduled for 39 weeks on 06/18/18 at 0930 --Reviewed signs of labor present to [facility]U  Term labor symptoms and general obstetric precautions including but not limited to vaginal bleeding,  contractions, leaking of fluid and fetal movement were reviewed in detail with the patient. Please refer to After Visit Summary for other counseling recommendations.  Return in about 1 week (around 06/12/2018).  Future Appointments  Date Time Provider Department Center  06/12/2018 10:15 AM Federico FlakeNewton, Kimberly Niles, MD [facility]  06/17/2018  8:30 AM [facility] PAT 5 [facility] None    Calvert CantorSamantha C Weinhold, CNM

## 2018-06-06 ENCOUNTER — Telehealth (HOSPITAL_COMMUNITY): Payer: Self-pay | Admitting: *Deleted

## 2018-06-06 ENCOUNTER — Encounter (HOSPITAL_COMMUNITY): Payer: Self-pay | Admitting: *Deleted

## 2018-06-12 ENCOUNTER — Encounter: Payer: Medicaid Other | Admitting: Family Medicine

## 2018-06-13 ENCOUNTER — Ambulatory Visit (INDEPENDENT_AMBULATORY_CARE_PROVIDER_SITE_OTHER): Payer: Medicaid Other | Admitting: Obstetrics & Gynecology

## 2018-06-13 VITALS — BP 103/70 | HR 92 | Wt 185.0 lb

## 2018-06-13 DIAGNOSIS — Z98891 History of uterine scar from previous surgery: Secondary | ICD-10-CM

## 2018-06-13 DIAGNOSIS — T8859XS Other complications of anesthesia, sequela: Secondary | ICD-10-CM

## 2018-06-13 DIAGNOSIS — O09529 Supervision of elderly multigravida, unspecified trimester: Secondary | ICD-10-CM

## 2018-06-13 DIAGNOSIS — O09523 Supervision of elderly multigravida, third trimester: Secondary | ICD-10-CM

## 2018-06-13 DIAGNOSIS — T4145XS Adverse effect of unspecified anesthetic, sequela: Secondary | ICD-10-CM

## 2018-06-13 NOTE — Progress Notes (Signed)
   PRENATAL VISIT NOTE  Subjective:  Roberta Reynolds is a 37 y.o. [id number] at 5832w3d being seen today for ongoing prenatal care.  She is currently monitored for the following issues for this high-risk pregnancy and has History of cesarean section x 2; Supervision of high-risk pregnancy of multigravida >39 years of age; Complication of anesthesia; AMA (advanced maternal age) multigravida 35+; Lethargy; Needle phobia; and Nausea and vomiting of pregnancy, antepartum on their problem list.  Patient reports no complaints.  [facility] and [facility] told her she had to get regional anesthesia. She is terrfied, and crying about this. Has PTSD from last time, emphatically says she will not get regional anesthesia. Felt she was coerced into this last time, without her husband present, and then she had to stay in house for four days with a debilitating spinal headache.  She adamantly refuses to get this again.   Contractions: Irritability. Vag. Bleeding: None.  Movement: Present. Denies leaking of fluid.   The following portions of the patient's history were reviewed and updated as appropriate: allergies, current medications, past family history, past medical history, past social history, past surgical history and problem list. Problem list updated.  Objective:   Vitals:   06/13/18 0915  BP: 103/70  Pulse: 92  Weight: 185 lb (83.9 kg)    Fetal Status: Fetal Heart Rate (bpm): 147   Movement: Present     General:  Alert, oriented and cooperative. Patient is in no acute distress.  Skin: Skin is warm and dry. No rash noted.   Cardiovascular: Normal heart rate noted  Respiratory: Normal respiratory effort, no problems with respiration noted  Abdomen: Soft, gravid, appropriate for gestational age.  Pain/Pressure: Present     Pelvic: Cervical exam deferred        Extremities: Normal range of motion.  Edema: Trace  Mental Status: Normal mood and affect. Normal behavior. Normal judgment and  thought content.   Assessment and Plan:  Pregnancy: [id number] at 7032w3d  1. History of cesarean section x 2 2. Complication of anesthesia, sequela Scheduled for RCS on 06/18/18.  Found out that Dr. Stephannie PetersHowze is the anesthesiologist on call on 06/18/18.  I sent her a text explaining this patient's distress and desire to avoid regional anesthesia. She is very stressed out about this.  Will also route this note to Dr. Stephannie PetersHowze and Dr. Malen GauzeFoster (OB Anesthesia Section Head).  Given her level of distress and her PTSD from last time, I feel that this constitutes a special circumstance that general anesthesia should be used for this patient. [facility](OB Surgeon on call) is aware of this.     3. Supervision of high-risk pregnancy of multigravida >39 years of age Term labor symptoms and general obstetric precautions including but not limited to vaginal bleeding, contractions, leaking of fluid and fetal movement were reviewed in detail with the patient. Please refer to After Visit Summary for other counseling recommendations.   Return in about 3 weeks (around 07/04/2018) for Incision check, followed by postpartum visit 2-3 weeks after than.  Future Appointments  Date Time Provider Department Center  06/17/2018  8:30 AM [facility] PAT 5 [facility] None  07/04/2018 11:00 AM [facility] NURSE [facility]  07/18/2018 10:15 AM Anyanwu, Jethro BastosUgonna A, MD [facility]    Jaynie CollinsUgonna Anyanwu, MD

## 2018-06-13 NOTE — Patient Instructions (Signed)
Return to clinic for any scheduled appointments or obstetric concerns, or go to [facility]U for evaluation  

## 2018-06-14 ENCOUNTER — Other Ambulatory Visit: Payer: Self-pay | Admitting: Obstetrics and Gynecology

## 2018-06-14 DIAGNOSIS — Z98891 History of uterine scar from previous surgery: Secondary | ICD-10-CM

## 2018-06-14 MED ORDER — DEXTROSE 5 % IV SOLN: 2.0000 g | INTRAVENOUS | Status: DC

## 2018-06-14 MED ORDER — SOD CITRATE-CITRIC ACID 500-334 MG/5ML PO SOLN: 30.0000 mL | ORAL | Status: DC

## 2018-06-15 ENCOUNTER — Other Ambulatory Visit: Payer: Self-pay | Admitting: Obstetrics and Gynecology

## 2018-06-17 ENCOUNTER — Encounter (HOSPITAL_COMMUNITY)
Admission: RE | Admit: 2018-06-17 | Discharge: 2018-06-17 | Disposition: A | Discharge status: Home / Self Care | Payer: Medicaid Other | Source: Ambulatory Visit | Attending: Obstetrics and Gynecology | Admitting: Obstetrics and Gynecology

## 2018-06-17 LAB — TYPE AND SCREEN
ABO/RH(D): A POS
Antibody Screen: NEGATIVE

## 2018-06-17 LAB — CBC
HCT: 33.9 % — ABNORMAL LOW (ref 36.0–46.0)
Hemoglobin: 11.3 g/dL — ABNORMAL LOW (ref 12.0–15.0)
MCH: 25.2 pg — AB (ref 26.0–34.0)
MCHC: 33.3 g/dL (ref 30.0–36.0)
MCV: 75.5 fL — ABNORMAL LOW (ref 80.0–100.0)
Platelets: 260 10*3/uL (ref 150–400)
RBC: 4.49 MIL/uL (ref 3.87–5.11)
RDW: 14.5 % (ref 11.5–15.5)
WBC: 9.5 10*3/uL (ref 4.0–10.5)
nRBC: 0 % (ref 0.0–0.2)

## 2018-06-17 NOTE — Patient Instructions (Signed)
Roberta Reynolds  06/17/2018   Your procedure is scheduled on:  06/18/2018  [facility] through [facility] at 0730 AM.  Pick up the phone at the desk and dial [phone]  Call this number if you have problems the morning of [phone]  Remember:   Do not eat food:(After Midnight) Desps de medianoche.  Do not drink clear liquids: (After Midnight) Desps de medianoche.  Take these medicines the morning of surgery with A SIP OF WATER: none   Do not wear jewelry, make-up or nail polish.  Do not wear lotions, powders, or perfumes. Do not wear deodorant.  Do not shave 48 hours prior to surgery.  Do not bring valuables to the hospital.  [facility] is not   responsible for any belongings or valuables brought to the hospital.  Contacts, dentures or bridgework may not be worn into surgery.  Leave suitcase in the car. After surgery it may be brought to your room.  For patients admitted to the hospital, checkout time is 11:00 AM the day of              discharge.    N/A   Please read over the following fact sheets that you were given:   Surgical Site Infection Prevention

## 2018-06-17 NOTE — Anesthesia Preprocedure Evaluation (Addendum)
Anesthesia Evaluation  Patient identified by MRN, date of birth, ID band Patient awake    Reviewed: Allergy & Precautions, NPO status , Patient's Chart, lab work & pertinent test results  History of Anesthesia Complications (+) POST - OP SPINAL HEADACHE and history of anesthetic complications  Airway Mallampati: II  TM Distance: >3 FB Neck ROM: Full    Dental  (+) Teeth Intact, Dental Advisory Given   Pulmonary neg pulmonary ROS,    Pulmonary exam normal breath sounds clear to auscultation       Cardiovascular negative cardio ROS Normal cardiovascular exam Rhythm:Regular Rate:Normal     Neuro/Psych Anxiety negative neurological ROS     GI/Hepatic negative GI ROS, Neg liver ROS,   Endo/Other  negative endocrine ROS  Renal/GU negative Renal ROS     Musculoskeletal negative musculoskeletal ROS (+)   Abdominal   Peds  Hematology negative hematology ROS (+)   Anesthesia Other Findings Day of surgery medications reviewed with the patient.  Reproductive/Obstetrics (+) Pregnancy Hx of C/S x2                            Anesthesia Physical Anesthesia Plan  ASA: II  Anesthesia Plan: General   Post-op Pain Management:    Induction: Intravenous  PONV Risk Score and Plan: 3 and Treatment may vary due to age or medical condition, Ondansetron and Dexamethasone  Airway Management Planned: Oral ETT and Video Laryngoscope Planned  Additional Equipment:   Intra-op Plan:   Post-operative Plan: Extubation in OR  Informed Consent: I have reviewed the patients History and Physical, chart, labs and discussed the procedure including the risks, benefits and alternatives for the proposed anesthesia with the patient or authorized representative who has indicated his/her understanding and acceptance.   Dental advisory given  Plan Discussed with: CRNA  Anesthesia Plan Comments: (R/B/A of GETA vs  spinal discussed. Patient prefers GETA.)       Anesthesia Quick Evaluation

## 2018-06-18 ENCOUNTER — Inpatient Hospital Stay (HOSPITAL_COMMUNITY): Payer: Medicaid Other | Admitting: Anesthesiology

## 2018-06-18 ENCOUNTER — Inpatient Hospital Stay (HOSPITAL_COMMUNITY)
Admission: RE | Admit: 2018-06-18 | Discharge: 2018-06-20 | DRG: 788 | Disposition: A | Discharge status: Home / Self Care | Payer: Medicaid Other | Attending: Obstetrics and Gynecology | Admitting: Obstetrics and Gynecology

## 2018-06-18 ENCOUNTER — Encounter (HOSPITAL_COMMUNITY): Payer: Self-pay | Admitting: Anesthesiology

## 2018-06-18 ENCOUNTER — Encounter (HOSPITAL_COMMUNITY)
Admission: RE | Disposition: A | Discharge status: Home / Self Care | Payer: Self-pay | Source: Home / Self Care | Attending: Obstetrics and Gynecology

## 2018-06-18 DIAGNOSIS — T8859XA Other complications of anesthesia, initial encounter: Secondary | ICD-10-CM | POA: Diagnosis present

## 2018-06-18 DIAGNOSIS — O34211 Maternal care for low transverse scar from previous cesarean delivery: Secondary | ICD-10-CM | POA: Diagnosis present

## 2018-06-18 DIAGNOSIS — Z98891 History of uterine scar from previous surgery: Secondary | ICD-10-CM

## 2018-06-18 DIAGNOSIS — O09529 Supervision of elderly multigravida, unspecified trimester: Secondary | ICD-10-CM

## 2018-06-18 DIAGNOSIS — O09523 Supervision of elderly multigravida, third trimester: Secondary | ICD-10-CM

## 2018-06-18 DIAGNOSIS — Z3A39 39 weeks gestation of pregnancy: Secondary | ICD-10-CM

## 2018-06-18 DIAGNOSIS — T4145XA Adverse effect of unspecified anesthetic, initial encounter: Secondary | ICD-10-CM | POA: Diagnosis present

## 2018-06-18 LAB — RPR: RPR Ser Ql: NONREACTIVE

## 2018-06-18 SURGERY — Surgical Case
Anesthesia: General

## 2018-06-18 MED ORDER — IBUPROFEN 600 MG PO TABS
600.0000 mg | ORAL_TABLET | Freq: Four times a day (QID) | ORAL | Status: DC | PRN
  Administered 2018-06-19 – 2018-06-20 (×6): 600 mg via ORAL
  Filled 2018-06-18 (×7): qty 1

## 2018-06-18 MED ORDER — FENTANYL CITRATE (PF) 250 MCG/5ML IJ SOLN
INTRAMUSCULAR | Status: AC
  Filled 2018-06-18: qty 5

## 2018-06-18 MED ORDER — HYDROMORPHONE 1 MG/ML IV SOLN
INTRAVENOUS | Status: DC
  Administered 2018-06-18: 0.3 mg via INTRAVENOUS
  Filled 2018-06-18: qty 25

## 2018-06-18 MED ORDER — HYDROMORPHONE HCL 1 MG/ML IJ SOLN
INTRAMUSCULAR | Status: AC
  Administered 2018-06-18: 0.25 mg via INTRAVENOUS
  Filled 2018-06-18: qty 1

## 2018-06-18 MED ORDER — SUCCINYLCHOLINE CHLORIDE 200 MG/10ML IV SOSY
PREFILLED_SYRINGE | INTRAVENOUS | Status: AC
  Filled 2018-06-18: qty 10

## 2018-06-18 MED ORDER — ONDANSETRON HCL 4 MG/2ML IJ SOLN
INTRAMUSCULAR | Status: AC
  Filled 2018-06-18: qty 2

## 2018-06-18 MED ORDER — SIMETHICONE 80 MG PO CHEW
80.0000 mg | CHEWABLE_TABLET | Freq: Three times a day (TID) | ORAL | Status: DC
  Administered 2018-06-19 – 2018-06-20 (×5): 80 mg via ORAL
  Filled 2018-06-18 (×5): qty 1

## 2018-06-18 MED ORDER — NALOXONE HCL 0.4 MG/ML IJ SOLN: 0.4000 mg | INTRAMUSCULAR | Status: DC | PRN

## 2018-06-18 MED ORDER — PROPOFOL 10 MG/ML IV BOLUS
INTRAVENOUS | Status: DC | PRN
  Administered 2018-06-18: 20 mg via INTRAVENOUS
  Administered 2018-06-18: 160 mg via INTRAVENOUS
  Administered 2018-06-18: 20 mg via INTRAVENOUS

## 2018-06-18 MED ORDER — MIDAZOLAM HCL 2 MG/2ML IJ SOLN
INTRAMUSCULAR | Status: AC
  Filled 2018-06-18: qty 2

## 2018-06-18 MED ORDER — ACETAMINOPHEN 500 MG PO TABS: 1000.0000 mg | ORAL_TABLET | Freq: Four times a day (QID) | ORAL | Status: DC

## 2018-06-18 MED ORDER — METOCLOPRAMIDE HCL 5 MG/ML IJ SOLN
INTRAMUSCULAR | Status: AC
  Filled 2018-06-18: qty 2

## 2018-06-18 MED ORDER — LIDOCAINE HCL (CARDIAC) PF 100 MG/5ML IV SOSY
PREFILLED_SYRINGE | INTRAVENOUS | Status: AC
  Filled 2018-06-18: qty 5

## 2018-06-18 MED ORDER — SOD CITRATE-CITRIC ACID 500-334 MG/5ML PO SOLN
30.0000 mL | ORAL | Status: AC
  Administered 2018-06-18: 30 mL via ORAL

## 2018-06-18 MED ORDER — ACETAMINOPHEN 500 MG PO TABS
1000.0000 mg | ORAL_TABLET | Freq: Four times a day (QID) | ORAL | Status: DC
  Administered 2018-06-19 (×3): 1000 mg via ORAL
  Filled 2018-06-18 (×3): qty 2

## 2018-06-18 MED ORDER — HYDROMORPHONE HCL 1 MG/ML IJ SOLN
0.2500 mg | INTRAMUSCULAR | Status: DC | PRN
  Administered 2018-06-18 (×2): 0.25 mg via INTRAVENOUS

## 2018-06-18 MED ORDER — ACETAMINOPHEN 160 MG/5ML PO SOLN: 325.0000 mg | ORAL | Status: DC | PRN

## 2018-06-18 MED ORDER — DEXAMETHASONE SODIUM PHOSPHATE 10 MG/ML IJ SOLN
INTRAMUSCULAR | Status: AC
  Filled 2018-06-18: qty 1

## 2018-06-18 MED ORDER — DIPHENHYDRAMINE HCL 25 MG PO CAPS: 25.0000 mg | ORAL_CAPSULE | Freq: Four times a day (QID) | ORAL | Status: DC | PRN

## 2018-06-18 MED ORDER — PROMETHAZINE HCL 25 MG/ML IJ SOLN: 6.2500 mg | INTRAMUSCULAR | Status: DC | PRN

## 2018-06-18 MED ORDER — SUCCINYLCHOLINE CHLORIDE 20 MG/ML IJ SOLN
INTRAMUSCULAR | Status: DC | PRN
  Administered 2018-06-18: 100 mg via INTRAVENOUS

## 2018-06-18 MED ORDER — ENOXAPARIN SODIUM 40 MG/0.4ML ~~LOC~~ SOLN
40.0000 mg | SUBCUTANEOUS | Status: DC
  Administered 2018-06-19 – 2018-06-20 (×2): 40 mg via SUBCUTANEOUS
  Filled 2018-06-18 (×2): qty 0.4

## 2018-06-18 MED ORDER — MIDAZOLAM HCL (PF) 5 MG/ML IJ SOLN
INTRAMUSCULAR | Status: DC | PRN
  Administered 2018-06-18: 2 mg via INTRAVENOUS

## 2018-06-18 MED ORDER — ONDANSETRON HCL 4 MG/2ML IJ SOLN
INTRAMUSCULAR | Status: DC | PRN
  Administered 2018-06-18: 4 mg via INTRAVENOUS

## 2018-06-18 MED ORDER — OXYTOCIN 40 UNITS IN LACTATED RINGERS INFUSION - SIMPLE MED: 2.5000 [IU]/h | INTRAVENOUS | Status: DC

## 2018-06-18 MED ORDER — KETOROLAC TROMETHAMINE 30 MG/ML IJ SOLN: 30.0000 mg | Freq: Once | INTRAMUSCULAR | Status: DC | PRN

## 2018-06-18 MED ORDER — PRENATAL MULTIVITAMIN CH
1.0000 | ORAL_TABLET | Freq: Every day | ORAL | Status: DC
  Administered 2018-06-19 – 2018-06-20 (×2): 1 via ORAL
  Filled 2018-06-18 (×2): qty 1

## 2018-06-18 MED ORDER — WITCH HAZEL-GLYCERIN EX PADS: 1.0000 "application " | MEDICATED_PAD | CUTANEOUS | Status: DC | PRN

## 2018-06-18 MED ORDER — HYDROMORPHONE HCL 1 MG/ML IJ SOLN: 1.0000 mg | INTRAMUSCULAR | Status: DC | PRN

## 2018-06-18 MED ORDER — METOCLOPRAMIDE HCL 5 MG/ML IJ SOLN
INTRAMUSCULAR | Status: DC | PRN
  Administered 2018-06-18: 10 mg via INTRAVENOUS

## 2018-06-18 MED ORDER — SOD CITRATE-CITRIC ACID 500-334 MG/5ML PO SOLN
ORAL | Status: AC
  Filled 2018-06-18: qty 15

## 2018-06-18 MED ORDER — DIPHENHYDRAMINE HCL 50 MG/ML IJ SOLN: 12.5000 mg | Freq: Four times a day (QID) | INTRAMUSCULAR | Status: DC | PRN

## 2018-06-18 MED ORDER — PROPOFOL 10 MG/ML IV BOLUS
INTRAVENOUS | Status: AC
  Filled 2018-06-18: qty 20

## 2018-06-18 MED ORDER — FENTANYL CITRATE (PF) 100 MCG/2ML IJ SOLN
INTRAMUSCULAR | Status: DC | PRN
  Administered 2018-06-18: 100 ug via INTRAVENOUS
  Administered 2018-06-18: 50 ug via INTRAVENOUS
  Administered 2018-06-18: 25 ug via INTRAVENOUS
  Administered 2018-06-18: 50 ug via INTRAVENOUS
  Administered 2018-06-18: 25 ug via INTRAVENOUS

## 2018-06-18 MED ORDER — MIDAZOLAM HCL 2 MG/2ML IJ SOLN
INTRAMUSCULAR | Status: DC | PRN
  Administered 2018-06-18: 2 mg via INTRAVENOUS

## 2018-06-18 MED ORDER — LIDOCAINE HCL (CARDIAC) PF 100 MG/5ML IV SOSY
PREFILLED_SYRINGE | INTRAVENOUS | Status: DC | PRN
  Administered 2018-06-18: 40 mg via INTRAVENOUS

## 2018-06-18 MED ORDER — SODIUM CHLORIDE 0.9 % IR SOLN
Status: DC | PRN
  Administered 2018-06-18: 1

## 2018-06-18 MED ORDER — DIPHENHYDRAMINE HCL 12.5 MG/5ML PO ELIX
12.5000 mg | ORAL_SOLUTION | Freq: Four times a day (QID) | ORAL | Status: DC | PRN
  Filled 2018-06-18: qty 5

## 2018-06-18 MED ORDER — OXYTOCIN 10 UNIT/ML IJ SOLN
INTRAVENOUS | Status: DC | PRN
  Administered 2018-06-18: 40 [IU] via INTRAVENOUS

## 2018-06-18 MED ORDER — TETANUS-DIPHTH-ACELL PERTUSSIS 5-2.5-18.5 LF-MCG/0.5 IM SUSP: 0.5000 mL | Freq: Once | INTRAMUSCULAR | Status: DC

## 2018-06-18 MED ORDER — BUPIVACAINE HCL (PF) 0.5 % IJ SOLN
INTRAMUSCULAR | Status: AC
  Filled 2018-06-18: qty 30

## 2018-06-18 MED ORDER — ACETAMINOPHEN 325 MG PO TABS: 325.0000 mg | ORAL_TABLET | ORAL | Status: DC | PRN

## 2018-06-18 MED ORDER — OXYCODONE-ACETAMINOPHEN 5-325 MG PO TABS
1.0000 | ORAL_TABLET | Freq: Four times a day (QID) | ORAL | Status: DC | PRN
  Administered 2018-06-19: 1 via ORAL
  Filled 2018-06-18: qty 1

## 2018-06-18 MED ORDER — POLYETHYLENE GLYCOL 3350 17 G PO PACK
17.0000 g | PACK | Freq: Every day | ORAL | Status: DC
  Administered 2018-06-19 – 2018-06-20 (×2): 17 g via ORAL
  Filled 2018-06-18 (×3): qty 1

## 2018-06-18 MED ORDER — MENTHOL 3 MG MT LOZG
1.0000 | LOZENGE | OROMUCOSAL | Status: DC | PRN
  Administered 2018-06-19: 3 mg via ORAL
  Filled 2018-06-18: qty 9

## 2018-06-18 MED ORDER — KETOROLAC TROMETHAMINE 30 MG/ML IJ SOLN
INTRAMUSCULAR | Status: DC | PRN
  Administered 2018-06-18: 30 mg via INTRAVENOUS

## 2018-06-18 MED ORDER — LACTATED RINGERS IV SOLN
INTRAVENOUS | Status: DC
  Administered 2018-06-18: 08:00:00 via INTRAVENOUS

## 2018-06-18 MED ORDER — SENNOSIDES-DOCUSATE SODIUM 8.6-50 MG PO TABS
2.0000 | ORAL_TABLET | Freq: Every evening | ORAL | Status: DC | PRN
  Administered 2018-06-19 – 2018-06-20 (×2): 2 via ORAL
  Filled 2018-06-18 (×2): qty 2

## 2018-06-18 MED ORDER — GLYCOPYRROLATE 0.2 MG/ML IJ SOLN
INTRAMUSCULAR | Status: AC
  Filled 2018-06-18: qty 1

## 2018-06-18 MED ORDER — DEXAMETHASONE SODIUM PHOSPHATE 10 MG/ML IJ SOLN
INTRAMUSCULAR | Status: DC | PRN
  Administered 2018-06-18: 10 mg via INTRAVENOUS

## 2018-06-18 MED ORDER — LACTATED RINGERS IV SOLN
INTRAVENOUS | Status: DC | PRN
  Administered 2018-06-18: 13:00:00 via INTRAVENOUS

## 2018-06-18 MED ORDER — HYDROMORPHONE HCL 1 MG/ML IJ SOLN
INTRAMUSCULAR | Status: AC
  Filled 2018-06-18: qty 0.5

## 2018-06-18 MED ORDER — CEFAZOLIN SODIUM-DEXTROSE 2-4 GM/100ML-% IV SOLN
2.0000 g | INTRAVENOUS | Status: AC
  Administered 2018-06-18: 2 g via INTRAVENOUS
  Filled 2018-06-18: qty 100

## 2018-06-18 MED ORDER — ONDANSETRON HCL 4 MG/2ML IJ SOLN: 4.0000 mg | Freq: Four times a day (QID) | INTRAMUSCULAR | Status: DC | PRN

## 2018-06-18 MED ORDER — BUPIVACAINE HCL (PF) 0.5 % IJ SOLN
INTRAMUSCULAR | Status: DC | PRN
  Administered 2018-06-18: 30 mL

## 2018-06-18 MED ORDER — DIBUCAINE 1 % RE OINT: 1.0000 "application " | TOPICAL_OINTMENT | RECTAL | Status: DC | PRN

## 2018-06-18 MED ORDER — LACTATED RINGERS IV SOLN
INTRAVENOUS | Status: DC
  Administered 2018-06-18: 950 mL via INTRAVENOUS

## 2018-06-18 MED ORDER — COCONUT OIL OIL
1.0000 "application " | TOPICAL_OIL | Status: DC | PRN
  Administered 2018-06-19: 1 via TOPICAL
  Filled 2018-06-18: qty 120

## 2018-06-18 MED ORDER — SODIUM CHLORIDE 0.9% FLUSH: 9.0000 mL | INTRAVENOUS | Status: DC | PRN

## 2018-06-18 MED ORDER — KETOROLAC TROMETHAMINE 30 MG/ML IJ SOLN
INTRAMUSCULAR | Status: AC
  Filled 2018-06-18: qty 1

## 2018-06-18 SURGICAL SUPPLY — 39 items
BENZOIN TINCTURE PRP APPL 2/3 (GAUZE/BANDAGES/DRESSINGS) ×3 IMPLANT
CANISTER SUCT 3000ML PPV (MISCELLANEOUS) ×3 IMPLANT
CHLORAPREP W/TINT 26ML (MISCELLANEOUS) ×3 IMPLANT
CLOSURE STERI STRIP 1/2 X4 (GAUZE/BANDAGES/DRESSINGS) ×2 IMPLANT
CLOSURE WOUND 1/2 X4 (GAUZE/BANDAGES/DRESSINGS) ×1
DRSG OPSITE POSTOP 4X10 (GAUZE/BANDAGES/DRESSINGS) ×3 IMPLANT
ELECT REM PT RETURN 9FT ADLT (ELECTROSURGICAL) ×3
ELECTRODE REM PT RTRN 9FT ADLT (ELECTROSURGICAL) ×1 IMPLANT
EXTRACTOR VACUUM KIWI (MISCELLANEOUS) ×3 IMPLANT
GLOVE BIOGEL PI IND STRL 7.0 (GLOVE) ×2 IMPLANT
GLOVE BIOGEL PI IND STRL 7.5 (GLOVE) ×1 IMPLANT
GLOVE BIOGEL PI INDICATOR 7.0 (GLOVE) ×4
GLOVE BIOGEL PI INDICATOR 7.5 (GLOVE) ×2
GLOVE SKINSENSE NS SZ7.0 (GLOVE) ×2
GLOVE SKINSENSE STRL SZ7.0 (GLOVE) ×1 IMPLANT
GOWN STRL REUS W/ TWL LRG LVL3 (GOWN DISPOSABLE) ×2 IMPLANT
GOWN STRL REUS W/ TWL XL LVL3 (GOWN DISPOSABLE) ×1 IMPLANT
GOWN STRL REUS W/TWL LRG LVL3 (GOWN DISPOSABLE) ×4
GOWN STRL REUS W/TWL XL LVL3 (GOWN DISPOSABLE) ×2
NS IRRIG 1000ML POUR BTL (IV SOLUTION) ×3 IMPLANT
PACK C SECTION WH (CUSTOM PROCEDURE TRAY) ×3 IMPLANT
PAD ABD 7.5X8 STRL (GAUZE/BANDAGES/DRESSINGS) ×3 IMPLANT
PAD ABD 8X7 1/2 STERILE (GAUZE/BANDAGES/DRESSINGS) ×6 IMPLANT
PAD OB MATERNITY 4.3X12.25 (PERSONAL CARE ITEMS) ×3 IMPLANT
PAD PREP 24X48 CUFFED NSTRL (MISCELLANEOUS) ×3 IMPLANT
PENCIL SMOKE EVAC W/HOLSTER (ELECTROSURGICAL) ×3 IMPLANT
SPONGE DRAIN TRACH 4X4 STRL 2S (GAUZE/BANDAGES/DRESSINGS) ×3 IMPLANT
SPONGE LAP 18X18 RF (DISPOSABLE) ×9 IMPLANT
STRIP CLOSURE SKIN 1/2X4 (GAUZE/BANDAGES/DRESSINGS) ×2 IMPLANT
SUT MNCRL 0 VIOLET CTX 36 (SUTURE) ×2 IMPLANT
SUT MON AB 4-0 PS1 27 (SUTURE) ×3 IMPLANT
SUT MONOCRYL 0 CTX 36 (SUTURE) ×4
SUT PLAIN 2 0 XLH (SUTURE) ×3 IMPLANT
SUT VIC AB 0 CT1 36 (SUTURE) ×6 IMPLANT
SUT VIC AB 3-0 CT1 27 (SUTURE) ×2
SUT VIC AB 3-0 CT1 TAPERPNT 27 (SUTURE) ×1 IMPLANT
SYR 30ML LL (SYRINGE) ×3 IMPLANT
TAPE MICROFOAM 4IN (TAPE) ×3 IMPLANT
TOWEL OR 17X24 6PK STRL BLUE (TOWEL DISPOSABLE) ×6 IMPLANT

## 2018-06-18 NOTE — Progress Notes (Signed)
Spoke with Steward DroneVeronica Rogers, CNM concerning PCA pump.  New orders will be put in to discontinue once reviewed.

## 2018-06-18 NOTE — Anesthesia Postprocedure Evaluation (Signed)
Anesthesia Post Note  Patient: Danetta Turpin  Procedure(s) Performed: CESAREAN SECTION (N/A )     Patient location during evaluation: PACU Anesthesia Type: General Level of consciousness: awake and alert Pain management: pain level controlled Vital Signs Assessment: post-procedure vital signs reviewed and stable Respiratory status: spontaneous breathing, nonlabored ventilation and respiratory function stable Cardiovascular status: blood pressure returned to baseline and stable Postop Assessment: no apparent nausea or vomiting Anesthetic complications: no    Last Vitals:  Vitals:   06/18/18 1345 06/18/18 1358  BP: 121/82   Pulse: (!) 109 98  Resp: 19 20  Temp:    SpO2: 100% 100%    Last Pain:  Vitals:   06/18/18 1358  TempSrc:   PainSc: 10-Worst pain ever   Pain Goal:                 Kaylyn LayerKathryn E Alante Weimann

## 2018-06-18 NOTE — Progress Notes (Signed)
PCA (dilaudid) d/c per provider orders at 2029, 06/18/18

## 2018-06-18 NOTE — H&P (Signed)
Obstetrics Admission History & Physical  06/18/2018 - 8:54 AM Primary OBGYN: [facility]  Chief Complaint: scheduled repeat c-section  History of Present Illness  37 y.o. [id number] @ 3576w1d , with the above CC. Pregnancy complicated by: h/o c-section x 2, AMA  Ms. Roberta Reynolds states that she feels well  Review of Systems: as noted in the History of Present Illness.   PMHx:  Past Medical History:  Diagnosis Date  . Complication of anesthesia    spinal heachache wants general anesthesia for CS  . Spinal headache    for four days   PSHx:  Past Surgical History:  Procedure Laterality Date  . CESAREAN SECTION     Medications:  Facility-Administered Medications Prior to Admission  Medication Dose Route Frequency Provider Last Rate Last Dose  . ceFAZolin (ANCEF) 2 g in dextrose 5 % 100 mL IVPB  2 g Intravenous 30 min Pre-Op [place] BingPickens, Jearline Hirschhorn, MD      . sodium citrate-citric acid (ORACIT) solution 30 mL  30 mL Oral 30 min Pre-Op [place] BingPickens, Jasamine Pottinger, MD       Medications Prior to Admission  Medication Sig Dispense Refill Last Dose  . calcium carbonate (TUMS - DOSED IN MG ELEMENTAL CALCIUM) 500 MG chewable tablet Chew 1 tablet by mouth 3 (three) times daily as needed for indigestion or heartburn.   06/17/2018 at Unknown time  . Prenatal Vit-Fe Fumarate-FA (PRENATAL MULTIVITAMIN) TABS tablet Take 1 tablet by mouth daily.    06/17/2018 at Unknown time     Allergies: has No Known Allergies. OBHx:  OB History  Gravida Para Term Preterm AB Living  4 2 2  0 1 2  SAB TAB Ectopic Multiple Live Births  1 0 0 0 2    # Outcome Date GA Lbr Len/2nd Weight Sex Delivery Anes PTL Lv  4 Current           3 Term 01/07/13    F CS-LTranv   LIV  2 Term 07/19/10    F CS-LTranv   LIV  1 SAB 06/26/09     SAB       Obstetric Comments  Unable to tolerate epidural - Needs general anesthesia only    GYNHx:  History of STIs: No..             FHx:  Family History  Problem Relation Age of Onset   . Cancer Maternal Aunt   . Cancer Paternal Uncle    Soc Hx:  Social History   Socioeconomic History  . Marital status: Married    Spouse name: Not on file  . Number of children: Not on file  . Years of education: Not on file  . Highest education level: Not on file  Occupational History  . Not on file  Social Needs  . Financial resource strain: Not on file  . Food insecurity:    Worry: Not on file    Inability: Not on file  . Transportation needs:    Medical: Not on file    Non-medical: Not on file  Tobacco Use  . Smoking status: Never Smoker  . Smokeless tobacco: Never Used  Substance and Sexual Activity  . Alcohol use: No  . Drug use: No  . Sexual activity: Yes    Partners: Male    Birth control/protection: None  Lifestyle  . Physical activity:    Days per week: Not on file    Minutes per session: Not on file  . Stress: Not on file  Relationships  .  Social connections:    Talks on phone: Not on file    Gets together: Not on file    Attends religious service: Not on file    Active member of club or organization: Not on file    Attends meetings of clubs or organizations: Not on file    Relationship status: Not on file  . Intimate partner violence:    Fear of current or ex partner: Not on file    Emotionally abused: Not on file    Physically abused: Not on file    Forced sexual activity: Not on file  Other Topics Concern  . Not on file  Social History Narrative  . Not on file    Objective    Current Vital Signs 24h Vital Sign Ranges  T 97.8 F (36.6 C) Temp  Avg: 97.8 F (36.6 C)  Min: 97.8 F (36.6 C)  Max: 97.8 F (36.6 C)  BP 110/70 BP  Min: 110/70  Max: 110/70  HR   No data recorded  RR 18 Resp  Avg: 18  Min: 18  Max: 18  SaO2   Room Air No data recorded       24 Hour I/O Current Shift I/O  Time Ins Outs No intake/output data recorded. No intake/output data recorded.   FHT: 160   General: Well nourished, well developed female in no acute  distress.  Skin:  Warm and dry.  Cardiovascular: S1, S2 normal, no murmur, rub or gallop, regular rate and rhythm Respiratory:  Clear to auscultation bilateral. Normal respiratory effort Abdomen: gravid, nttp, well healed low transverse skin incision Neuro/Psych:  Normal mood and affect.   Labs  A POS RPR negative Recent Labs  Lab 06/17/18 1043  WBC 9.5  HGB 11.3*  HCT 33.9*  PLT 260   Radiology No new imaging. Anterior placenta  Assessment & Plan   37 y.o. [id number] @ 3897w1d with  *Pregnancy: routine care *Delivery: pt okay with scheduled c-section. No BTL. D/w her and she wants general, which she states was okay with anesthesia. Can proceed when OR is ready  Roberta Reynolds, Jr. MD Attending Center for [facility](Faculty Practice)

## 2018-06-18 NOTE — Lactation Note (Signed)
This note was copied from a baby's chart. Lactation Consultation Note  Patient Name: Roberta Lafe GarinMarwa Lozon NWGNF'AToday's Date: 06/18/2018 Reason for consult: Initial assessment;Term;Other (Comment)(AMA)  6 hours old FT female who is being exclusively BF by his mother, she's a P3 and experienced BF. She was able to BF her first child for 20 months and her second one for 15 months. Mom plans to BF this baby for two years, [facility] "[facility] holy book recommends BF for two years" parents are from [place]; praised her for her efforts. Mom has a [place] DEBP at home.  Baby just started nursing when entering the room, but noticed that mom wasn't doing STS, baby's neck was crooked, offered assistance and mom was very easy to work with, she followed all LC's suggestions, removed blankets and repositioned baby on a cross cradle hold, a few audible swallows were heard upon breast compressions, mom still need to work on the depth though, she was afraid that baby didn't have enough room to "breathe" the latch could be deeper. Baby fed for 15 minutes.  When assisting with hand expression, colostrum easily flowed out of her left breast, parents were very pleased that baby had "a lot" to eat. Discussed benefits of STS and feeding cues.  Feeding plan:  1. Encouraged mom to feed baby STS 8-12 times/24 hours or sooner if feeding cues are present 2. Hand expression and spoon/finger feeding was also encouraged  Parents reported all questions and concerns were answered, they're both aware of LC services and will call PRN.     Maternal Data Formula Feeding for Exclusion: No Has patient been taught Hand Expression?: Yes Does the patient have breastfeeding experience prior to this delivery?: Yes  Feeding Feeding Type: Breast Fed  LATCH Score Latch: Grasps breast easily, tongue down, lips flanged, rhythmical sucking.  Audible Swallowing: A few with stimulation  Type of Nipple: Everted at rest and after  stimulation  Comfort (Breast/Nipple): Soft / non-tender  Hold (Positioning): Assistance needed to correctly position infant at breast and maintain latch.(to switch from typical cradle to cross cradle, baby's neck was crooked)  LATCH Score: 8  Interventions Interventions: Breast feeding basics reviewed;Assisted with latch;Skin to skin;Breast massage;Hand express;Breast compression;Position options;Support pillows;Adjust position  Lactation Tools Discussed/Used WIC Program: No   Consult Status Consult Status: PRN Date: 06/19/18 Follow-up type: In-patient    Syanne Looney Venetia ConstableS Marice Guidone 06/18/2018, 6:43 PM

## 2018-06-18 NOTE — Transfer of Care (Signed)
Immediate Anesthesia Transfer of Care Note  Patient: Roberta Reynolds  Procedure(s) Performed: CESAREAN SECTION (N/A )  Patient Location: PACU  Anesthesia Type:General  Level of Consciousness: awake, alert  and oriented  Airway & Oxygen Therapy: Patient Spontanous Breathing and Patient connected to nasal cannula oxygen  Post-op Assessment: Report given to RN, Post -op Vital signs reviewed and stable and Patient moving all extremities  Post vital signs: Reviewed and stable  Last Vitals:  Vitals Value Taken Time  BP 123/77 06/18/2018  1:27 PM  Temp    Pulse 105 06/18/2018  1:30 PM  Resp 14 06/18/2018  1:30 PM  SpO2 100 % 06/18/2018  1:30 PM  Vitals shown include unvalidated device data.  Last Pain:  Vitals:   06/18/18 0802  TempSrc: Oral         Complications: No apparent anesthesia complications

## 2018-06-18 NOTE — Anesthesia Procedure Notes (Signed)
Procedure Name: Intubation Date/Time: 06/18/2018 12:32 PM Performed by: Elgie CongoMalinova, Deashia Soule H, CRNA Pre-anesthesia Checklist: Patient identified, Emergency Drugs available, Suction available and Patient being monitored Patient Re-evaluated:Patient Re-evaluated prior to induction Oxygen Delivery Method: Circle system utilized Preoxygenation: Pre-oxygenation with 100% oxygen Induction Type: IV induction Laryngoscope Size: Glidescope Grade View: Grade I Tube type: Oral Tube size: 7.0 mm Airway Equipment and Method: Rigid stylet Placement Confirmation: ETT inserted through vocal cords under direct vision,  positive ETCO2 and breath sounds checked- equal and bilateral Secured at: 21 cm Tube secured with: Tape Dental Injury: Teeth and Oropharynx as per pre-operative assessment

## 2018-06-18 NOTE — Progress Notes (Addendum)
Mother is eating, not nauseated, and pain is manageable (2) and has requested that PCA pump (dilaudid) be d/c and she can take PO medications.  Will notify physician.

## 2018-06-18 NOTE — Op Note (Signed)
Operative Note   SURGERY DATE: 06/18/2018  PRE-OP DIAGNOSIS:  *Pregnancy at 39/1 *History of cesarean section x 2 and desire for repeat *Desire for general anesthesia  POST-OP DIAGNOSIS: Same. delivered   PROCEDURE: Repeat  low transverse cesarean section via Claretha CooperJoel Cohen with double layer uterine closure  SURGEON: Surgeon(s) and Role:    * Burleigh BingPickens, Tamala Manzer, MD - Primary  ASSISTANT:    Earlene Plater* Wallace, Laurel S, DO - Assisting  ANESTHESIA: general (per patient request)  ESTIMATED BLOOD LOSS: 187mL  DRAINS: 100mL UOP via indwelling foley  TOTAL IV FLUIDS: per anesthesia note  VTE PROPHYLAXIS: SCDs to bilateral lower extremities  ANTIBIOTICS: Two grams of Cefazolin were given., within 1 hour of skin incision  SPECIMENS: none  COMPLICATIONS: none  FINDINGS: Fortunately, no intra-abdominal adhesions were noted. Grossly normal uterus, tubes and ovaries. Clear amniotic fluid, cephalic female infant, weight 3070gm, APGARs 8/8, intact placenta.  PROCEDURE IN DETAIL: The patient was taken to the operating room where anesthesia was administered and normal fetal heart tones were confirmed. She was then prepped and draped in the normal fashion in the dorsal supine position with a leftward tilt.  After a time out was performed, a Claretha CooperJoel Cohen incision was made with the scalpel and carried through to the underlying layer of fascia. The fascia was then incised at the midline and this incision and the muscles were then extended bluntly.The rectus muscles were then separated in the midline and the peritoneum was entered bluntly. The bladder blade was inserted and the vesicouterine peritoneum was identified and a low transverse hysterotomy was made with the scalpel until the endometrial cavity was breached and the amniotic sac ruptured with the Allis clamp, yielding clear amniotic fluid. This incision was extended bluntly and the infant's head, shoulders and body were delivered atraumatically.The cord was  clamped x 2 and cut, and the infant was handed to the awaiting pediatricians, after delayed cord clamping was not done.  The placenta was then gradually expressed from the uterus and then the uterus was exteriorized and cleared of all clots and debris. The hysterotomy was repaired with a running suture of 1-0 monocryl. A second imbricating layer of 1-0 monocryl suture was then placed to achieve excellent hemostasis.   The uterus and adnexa were then returned to the abdomen, and the hysterotomy and all operative sites were reinspected and excellent hemostasis was noted after irrigation and suction of the abdomen with warm saline.  The fascia was reapproximated with 0 Vicryl in a simple running fashion bilaterally. The subcutaneous layer was then reapproximated with interrupted sutures of 2-0 plain gut, and the skin was then closed with 4-0 monocryl, in a subcuticular fashion. Local anesthesia was then injected along the length of the incision.   The patient  tolerated the procedure well. Sponge, lap, needle, and instrument counts were correct x 2. The patient was transferred to the recovery room awake, alert and breathing independently in stable condition.  Cornelia Copaharlie Bartley Vuolo, Jr. MD Attending Center for [facility](Faculty Practice)

## 2018-06-19 ENCOUNTER — Other Ambulatory Visit: Payer: Self-pay

## 2018-06-19 LAB — CBC
HCT: 28.1 % — ABNORMAL LOW (ref 36.0–46.0)
Hemoglobin: 9.3 g/dL — ABNORMAL LOW (ref 12.0–15.0)
MCH: 25.1 pg — AB (ref 26.0–34.0)
MCHC: 33.1 g/dL (ref 30.0–36.0)
MCV: 75.9 fL — AB (ref 80.0–100.0)
Platelets: 233 10*3/uL (ref 150–400)
RBC: 3.7 MIL/uL — ABNORMAL LOW (ref 3.87–5.11)
RDW: 14.6 % (ref 11.5–15.5)
WBC: 11.3 10*3/uL — ABNORMAL HIGH (ref 4.0–10.5)
nRBC: 0 % (ref 0.0–0.2)

## 2018-06-19 LAB — CREATININE, SERUM
Creatinine, Ser: 0.48 mg/dL (ref 0.44–1.00)
GFR calc Af Amer: >60 mL/min (ref >60)
GFR calc non Af Amer: >60 mL/min (ref >60)

## 2018-06-19 MED ORDER — TETANUS-DIPHTH-ACELL PERTUSSIS 5-2.5-18.5 LF-MCG/0.5 IM SUSP
0.5000 mL | INTRAMUSCULAR | Status: AC | PRN
  Administered 2018-06-20: 0.5 mL via INTRAMUSCULAR

## 2018-06-19 NOTE — Progress Notes (Signed)
Wasted 28 cc hydromorphone with Limmie PatriciaLisa Koceja Rn as a witness.

## 2018-06-20 MED ORDER — SENNOSIDES-DOCUSATE SODIUM 8.6-50 MG PO TABS: 2.0000 | ORAL_TABLET | Freq: Every evening | ORAL | 0 refills | Status: DC | PRN

## 2018-06-20 MED ORDER — IBUPROFEN 600 MG PO TABS: 600.0000 mg | ORAL_TABLET | Freq: Four times a day (QID) | ORAL | 0 refills | Status: DC | PRN

## 2018-06-20 MED ORDER — OXYCODONE-ACETAMINOPHEN 5-325 MG PO TABS: 1.0000 | ORAL_TABLET | Freq: Four times a day (QID) | ORAL | 0 refills | Status: DC | PRN

## 2018-06-20 NOTE — Progress Notes (Signed)
CSW received consult due to score 11 on Edinburgh Depression Screen.    CSW met with MOB at bedside to discuss edinburgh score 11. MOB was welcoming and pleasant during assessment. MOB appeared to be attached and bonded with baby and was breastfeeding during the assessment. CSW and MOB discussed edinburgh score 11. MOB reported that she was doing quite well and has good support from her husband, husband's brother and friend. MOB reported that her family is not local but she has support from husband/husband's family. MOB denied any mental health history. MOB presented calm and pleasant. MOB did not demonstrate any acute mental health signs/symptoms. CSW assessed for safety, MOB denied SI, HI and domestic violence.   CSW provided education regarding Baby Blues vs PMADs and provided MOB with resources for mental health follow up.  CSW encouraged MOB to evaluate her mental health throughout the postpartum period with the use of the New Mom Checklist developed by Postpartum Progress as well as the Edinburgh Postnatal Depression Scale and notify a medical professional if symptoms arise.    No barriers to discharge.   Didi Ganaway, LCSWA Clinical Social Worker [facility] Cell#: [phone] 

## 2018-06-20 NOTE — Discharge Summary (Signed)
Postpartum Discharge Summary     Patient Name: Roberta GarinMarwa Dubow DOB: 07/16/1980 MRN: [id number]  Date of admission: 06/18/2018 Delivering Provider: [place] BingPICKENS, CHARLIE   Date of discharge: 06/20/2018  Admitting diagnosis: RCS Intrauterine pregnancy: 5920w1d     Secondary diagnosis:  Principal Problem:   History of cesarean section x 2 Active Problems:   Complication of anesthesia   AMA (advanced maternal age) multigravida 35+   Status post cesarean delivery   Cesarean delivery delivered  Additional problems: None     Discharge diagnosis: Term Pregnancy Delivered and Advanced maternal age, repeat cesarean                                                                                                Post partum procedures:None  Augmentation: N/A  Complications: None  Hospital course:  Sceduled C/S   37 y.o. yo [id number] at 2720w1d was admitted to the hospital 06/18/2018 for scheduled cesarean section with the following indication:Elective Repeat.  Membrane Rupture Time/Date: 12:35 PM ,06/18/2018   Patient delivered a Viable infant.06/18/2018  Details of operation can be found in separate operative note.  Pateint had an uncomplicated postpartum course.  She is ambulating, tolerating a regular diet, passing flatus, and urinating well. Patient is discharged home in stable condition on  06/20/18         Magnesium Sulfate recieved: No BMZ received: No  Physical exam  Vitals:   06/19/18 1215 06/19/18 1353 06/19/18 2215 06/20/18 0557  BP: 107/71 102/73 99/64 102/69  Pulse: 78 81 80 89  Resp: 18 18 14 17   Temp: 98.5 F (36.9 C) 98.1 F (36.7 C) 97.9 F (36.6 C) 98.3 F (36.8 C)  TempSrc: Oral Oral Oral Oral  SpO2: 98%  99% 99%  Weight:      Height:       General: alert, cooperative and no distress Lochia: appropriate Uterine Fundus: firm Incision: Healing well with no significant drainage, new dressing applied due to coming off. Mild clean bullae outside of dressing due to  tape allergy, no infection noted. DVT Evaluation: No evidence of DVT seen on physical exam. Negative Homan's sign. Labs: Lab Results  Component Value Date   WBC 11.3 (H) 06/19/2018   HGB 9.3 (L) 06/19/2018   HCT 28.1 (L) 06/19/2018   MCV 75.9 (L) 06/19/2018   PLT 233 06/19/2018   CMP Latest Ref Rng & Units 06/19/2018  Glucose 65 - 99 mg/dL -  BUN 6 - 20 mg/dL -  Creatinine 1.470.44 - 8.291.00 mg/dL 5.620.48  Sodium 130134 - 865144 mmol/L -  Potassium 3.5 - 5.2 mmol/L -  Chloride 96 - 106 mmol/L -  CO2 20 - 29 mmol/L -  Calcium 8.7 - 10.2 mg/dL -    Discharge instruction: per After Visit Summary and "Baby and Me Booklet".  After visit meds:  Allergies as of 06/20/2018   No Known Allergies     Medication List    STOP taking these medications   calcium carbonate 500 MG chewable tablet Commonly known as:  TUMS - dosed in mg elemental calcium   prenatal multivitamin Tabs tablet  TAKE these medications   ibuprofen 600 MG tablet Commonly known as:  ADVIL,MOTRIN Take 1 tablet (600 mg total) by mouth every 6 (six) hours as needed for mild pain or cramping.   oxyCODONE-acetaminophen 5-325 MG tablet Commonly known as:  PERCOCET/ROXICET Take 1 tablet by mouth every 6 (six) hours as needed for moderate pain.   senna-docusate 8.6-50 MG tablet Commonly known as:  Senokot-S Take 2 tablets by mouth at bedtime as needed for mild constipation.       Diet: routine diet  Activity: Advance as tolerated. Pelvic rest for 6 weeks.   Outpatient follow up:2 weeks Follow up Appt: Future Appointments  Date Time Provider Department Center  07/04/2018 11:00 AM [facility] NURSE [facility]  07/18/2018 10:15 AM Anyanwu, Jethro BastosUgonna A, MD [facility]   Follow up Visit:   Please schedule this patient for Postpartum visit in: 4 weeks with the following provider: Any provider For C/S patients schedule nurse incision check in weeks 2 weeks: yes High risk pregnancy complicated by:  AMA Delivery mode:  CS Anticipated Birth Control:  OCPs PP Procedures needed: Incision check  Schedule Integrated BH visit: no      Newborn Data: Live born female  Birth Weight: 6 lb 12.3 oz (3070 g) APGAR: 8, 8  Newborn Delivery   Birth date/time:  06/18/2018 12:36:00 Delivery type:  C-Section, Low Transverse Trial of labor:  No C-section categorization:  Repeat     Baby Feeding: Breast Disposition:home with mother   06/20/2018 Cleda ClarksElizabeth W Mumaw, DO

## 2018-06-20 NOTE — Progress Notes (Signed)
Post Partum Day 1 Subjective: no complaints, up ad lib, voiding, tolerating PO and + flatus  Objective: Blood pressure 99/64, pulse 80, temperature 97.9 F (36.6 C), temperature source Oral, resp. rate 14, height 5\' 4"  (1.626 m), weight 85.5 kg, last menstrual period 09/05/2017, SpO2 99 %, unknown if currently breastfeeding.  Physical Exam:  General: alert, cooperative, appears stated age and no distress Lochia: appropriate Uterine Fundus: firm Incision: healing well, no significant drainage, no dehiscence, no significant erythema DVT Evaluation: No evidence of DVT seen on physical exam.  Recent Labs    06/17/18 1043 06/19/18 0555  HGB 11.3* 9.3*  HCT 33.9* 28.1*    Assessment/Plan: Plan for discharge tomorrow   LOS: 2 days   Gwenevere AbbotNimeka Smita Lesh 06/19/2018, 5:37 PM

## 2018-06-20 NOTE — Discharge Instructions (Signed)
Breastfeeding and Self-Care It is normal to have some problems when you start to breastfeed your new baby. But there are things that you can do to take care of yourself and help prevent many common problems. This includes keeping your breasts healthy and making sure that your baby's mouth attaches (latches) properly to your nipple for feedings. Work with your doctor or breastfeeding specialist (Science writer) to find what works best for you. Follow these instructions at home: Breastfeeding strategy   Always make sure that your baby latches properly to breastfeed.  Make sure that your baby is in a proper position. Try different breastfeeding positions to find one that works best for you and your baby.  Breastfeed when you feel like you need to make your breasts less full or when your baby shows signs of hunger. This is called "breastfeeding on demand."  Do not delay feedings.  Try to relax when it is time to feed your baby. This helps your body release milk from your breast.  To help increase milk flow: ? Remove a small amount of milk from your breast right before breastfeeding. Do this using a pump or by squeezing with your hand. ? Apply warm, moist heat to your breast right before feeding. You can do this in the shower or with hand towels soaked with warm water. ? Massage your breast right before or during feeding. Breast care   To help your breasts stay healthy and keep them from getting too dry: ? Avoid using soap on your nipples. ? Let your nipples air-dry for 3-4 minutes after each feeding. ? Use only cotton bra pads to soak up breast milk that leaks. Be sure to change the pads if they become soaked with milk. If you use bra pads that can be thrown away, change them often. ? Put some lanolin on your nipples after breastfeeding. Pure lanolin does not need to be washed off your nipple before you feed your baby again. Pure lanolin is not harmful to your baby. ? Rub some breast  milk into your nipples: ? Use your hand to squeeze out a few drops of breast milk. ? Gently massage the milk into your nipples. ? Let your nipples air-dry.  Wear a supportive nursing bra. Avoid wearing: ? Tight clothing. ? Underwire bras or bras that put pressure on your breasts.  Use ice to help relieve pain or swelling of your breasts: ? Put ice in a plastic bag. ? Place a towel between your skin and the bag. ? Leave the ice on for 20 minutes, 2-3 times a day. General instructions  Drink enough fluid to keep your pee (urine) pale yellow.  Get plenty of rest. Sleep when your baby sleeps.  Talk to your doctor or breastfeeding specialist before taking any herbal supplements. Contact a health care provider if:  You have nipple pain.  You have cracking or soreness in your nipples that lasts longer than 1 week.  Your breasts are overfilled with milk (engorgement) and this lasts longer than 48 hours.  You have a fever.  You have pus-like fluid coming from your nipple.  You have redness, a rash, swelling, itching, or burning on your breast.  Your baby does not gain weight.  Your baby loses weight. Summary  There are things that you can do to take care of yourself and help prevent many common breastfeeding problems.  Always make sure that your baby's mouth attaches (latches) to your nipple properly to breastfeed.  Keep your nipples  from getting too dry, drink plenty of fluid, and get plenty of rest.  Feed on demand. Do not delay feedings. This information is not intended to replace advice given to you by your health care provider. Make sure you discuss any questions you have with your health care provider. Document Released: 01/17/2017 Document Revised: 01/17/2017 Document Reviewed: 01/17/2017 Elsevier Interactive Patient Education  2019 Elsevier Inc. Cesarean Delivery, Care After This sheet gives you information about how to care for yourself after your procedure. Your  health care provider may also give you more specific instructions. If you have problems or questions, contact your health care provider. What can I expect after the procedure? After the procedure, it is common to have:  A small amount of blood or clear fluid coming from the incision.  Some redness, swelling, and pain in your incision area.  Some abdominal pain and soreness.  Vaginal bleeding (lochia). Even though you did not have a vaginal delivery, you will still have vaginal bleeding and discharge.  Pelvic cramps.  Fatigue. You may have pain, swelling, and discomfort in the tissue between your vagina and your anus (perineum) if:  Your C-section was unplanned, and you were allowed to labor and push.  An incision was made in the area (episiotomy) or the tissue tore during attempted vaginal delivery. Follow these instructions at home: Incision care   Follow instructions from your health care provider about how to take care of your incision. Make sure you: ? Wash your hands with soap and water before you change your bandage (dressing). If soap and water are not available, use hand sanitizer. ? If you have a dressing, change it or remove it as told by your health care provider. ? Leave stitches (sutures), skin staples, skin glue, or adhesive strips in place. These skin closures may need to stay in place for 2 weeks or longer. If adhesive strip edges start to loosen and curl up, you may trim the loose edges. Do not remove adhesive strips completely unless your health care provider tells you to do that.  Check your incision area every day for signs of infection. Check for: ? More redness, swelling, or pain. ? More fluid or blood. ? Warmth. ? Pus or a bad smell.  Do not take baths, swim, or use a hot tub until your health care provider says it's okay. Ask your health care provider if you can take showers.  When you cough or sneeze, hug a pillow. This helps with pain and decreases the  chance of your incision opening up (dehiscing). Do this until your incision heals. Medicines  Take over-the-counter and prescription medicines only as told by your health care provider.  If you were prescribed an antibiotic medicine, take it as told by your health care provider. Do not stop taking the antibiotic even if you start to feel better.  Do not drive or use heavy machinery while taking prescription pain medicine. Lifestyle  Do not drink alcohol. This is especially important if you are breastfeeding or taking pain medicine.  Do not use any products that contain nicotine or tobacco, such as cigarettes, e-cigarettes, and chewing tobacco. If you need help quitting, ask your health care provider. Eating and drinking  Drink at least 8 eight-ounce glasses of water every day unless told not to by your health care provider. If you breastfeed, you may need to drink even more water.  Eat high-fiber foods every day. These foods may help prevent or relieve constipation. High-fiber foods include: ?  Whole grain cereals and breads. ? Brown rice. ? Beans. ? Fresh fruits and vegetables. Activity   If possible, have someone help you care for your baby and help with household activities for at least a few days after you leave the hospital.  Return to your normal activities as told by your health care provider. Ask your health care provider what activities are safe for you.  Rest as much as possible. Try to rest or take a nap while your baby is sleeping.  Do not lift anything that is heavier than 10 lbs (4.5 kg), or the limit that you were told, until your health care provider says that it is safe.  Talk with your health care provider about when you can engage in sexual activity. This may depend on your: ? Risk of infection. ? How fast you heal. ? Comfort and desire to engage in sexual activity. General instructions  Do not use tampons or douches until your health care provider  approves.  Wear loose, comfortable clothing and a supportive and well-fitting bra.  Keep your perineum clean and dry. Wipe from front to back when you use the toilet.  If you pass a blood clot, save it and call your health care provider to discuss. Do not flush blood clots down the toilet before you get instructions from your health care provider.  Keep all follow-up visits for you and your baby as told by your health care provider. This is important. Contact a health care provider if:  You have: ? A fever. ? Bad-smelling vaginal discharge. ? Pus or a bad smell coming from your incision. ? Difficulty or pain when urinating. ? A sudden increase or decrease in the frequency of your bowel movements. ? More redness, swelling, or pain around your incision. ? More fluid or blood coming from your incision. ? A rash. ? Nausea. ? Little or no interest in activities you used to enjoy. ? Questions about caring for yourself or your baby.  Your incision feels warm to the touch.  Your breasts turn red or become painful or hard.  You feel unusually sad or worried.  You vomit.  You pass a blood clot from your vagina.  You urinate more than usual.  You are dizzy or light-headed. Get help right away if:  You have: ? Pain that does not go away or get better with medicine. ? Chest pain. ? Difficulty breathing. ? Blurred vision or spots in your vision. ? Thoughts about hurting yourself or your baby. ? New pain in your abdomen or in one of your legs. ? A severe headache.  You faint.  You bleed from your vagina so much that you fill more than one sanitary pad in one hour. Bleeding should not be heavier than your heaviest period. Summary  After the procedure, it is common to have pain at your incision site, abdominal cramping, and slight bleeding from your vagina.  Check your incision area every day for signs of infection.  Tell your health care provider about any unusual  symptoms.  Keep all follow-up visits for you and your baby as told by your health care provider. This information is not intended to replace advice given to you by your health care provider. Make sure you discuss any questions you have with your health care provider. Document Released: 03/04/2002 Document Revised: 12/19/2017 Document Reviewed: 12/19/2017 Elsevier Interactive Patient Education  2019 [employer]

## 2018-06-20 NOTE — Progress Notes (Signed)
CSW acknowledged consult for edinburgh score 11. CSW attempted to see MOB, MOB was in the bathroom. CSW agreed to come see MOB at a later time.   Celso SickleKimberly Kamani Magnussen, LCSWA Clinical Social Worker [facility] Cell#: [phone]

## 2018-06-20 NOTE — Lactation Note (Signed)
This note was copied from a baby's chart. Lactation Consultation Note  Patient Name: Roberta Lafe GarinMarwa Heldt ZOXWR'UToday's Date: 06/20/2018  Mom reports they are being d/c today.  Mom reports that nipples were a little sore but feeling better today.  Infant Bf on arrival.  Infant swaddled.  Urged mom to unswaddle him for feds so he can get in closer and may make nipples feel better.  Urged hand express and rub EMM on nipples and air dry. Infant with minimal weight loss, adequate voids and stools.  Urged mom to follow up with lactation as needed    Maternal Data    Feeding    LATCH Score                   Interventions    Lactation Tools Discussed/Used     Consult Status      Kyshon Tolliver Michaelle CopasS Arelia Volpe 06/20/2018, 3:10 PM

## 2018-06-20 NOTE — Lactation Note (Signed)
This note was copied from a baby's chart. Lactation Consultation Note  Patient Name: Boy Lafe GarinMarwa Pryor OACZY'SToday's Date: 06/20/2018  P3., 39 hour female infant . LC entered room, Mom and infant asleep.   Maternal Data    Feeding Feeding Type: Breast Fed  LATCH Score                   Interventions    Lactation Tools Discussed/Used     Consult Status      Danelle EarthlyRobin Epic Tribbett 06/20/2018, 4:13 AM

## 2018-07-04 ENCOUNTER — Ambulatory Visit: Payer: Medicaid Other | Admitting: *Deleted

## 2018-07-04 VITALS — BP 111/70 | HR 71

## 2018-07-04 DIAGNOSIS — Z4889 Encounter for other specified surgical aftercare: Secondary | ICD-10-CM

## 2018-07-04 NOTE — Progress Notes (Signed)
Subjective:     Roberta Reynolds is a 38 y.o. female who presents to the clinic 2 weeks status post cesarean on 06/18/2018 for an incision check. Incision healing well, no redness or drainage noted. Steri strips removed.     Doing well postoperatively.   Plan:    1. Continue any current medications. 2. Wound care discussed. 3. Follow up:  as sheduled for postpartum exam..   Scheryl Marten, RN

## 2018-07-05 NOTE — Progress Notes (Signed)
I have reviewed the chart and agree with nursing staff's documentation of this patient's encounter.  Roberta Melendrez, MD 07/05/2018 3:33 PM    

## 2018-07-17 NOTE — Progress Notes (Signed)
Post Partum Exam  Roberta Reynolds is a 38 y.o. [phone] female who presents for a postpartum visit. She is 4 weeks postpartum following a delivery on 06/18/2018. I have fully reviewed the prenatal and intrapartum course. The delivery was at 39 gestational weeks.  Anesthesia: general. Postpartum course has been uncomplicated. Baby's course has been uncomplicated. Baby is feeding by breast. Bleeding staining only. Bowel function is normal. Bladder function is normal. Patient is not sexually active. Contraception method is OCP (estrogen/progesterone). Postpartum depression screening:neg  The following portions of the patient's history were reviewed and updated as appropriate: allergies, current medications, past family history, past medical history, past social history, past surgical history and problem list. Last pap smear done  12/13/2017 and was Normal  Review of Systems Pertinent items noted in HPI and remainder of comprehensive ROS otherwise negative.    Objective:  Blood pressure 111/76, pulse 69, height 5\' 4"  (1.626 m), weight 169 lb (76.7 kg), unknown if currently breastfeeding.  General:  alert and no distress   Breasts:  inspection negative, no nipple discharge or bleeding, no masses or nodularity palpable  Lungs: clear to auscultation bilaterally  Heart:  regular rate and rhythm  Abdomen: soft, non-tender; bowel sounds normal; no masses,  no organomegaly. Incision is well-healed, C/D/I, no erythema  Pelvic:  not evaluated        Assessment:   Normal postpartum exam. Pap smear not done at today's visit.   Plan:   1. Contraception: OCP (estrogen/progesterone)/Sprintec desired by patient as she tolerated this in the past. Cautioned about potential risk of lowering amount of breast milk, also told to start in 2 weeks and use back up contraception for thew first couple of weeks. 2. Follow up or as needed.    Jaynie Collins, MD, FACOG Obstetrician & Gynecologist, [facility] for [employer], [facility]

## 2018-07-18 ENCOUNTER — Ambulatory Visit (INDEPENDENT_AMBULATORY_CARE_PROVIDER_SITE_OTHER): Payer: Medicaid Other | Admitting: Obstetrics & Gynecology

## 2018-07-18 DIAGNOSIS — Z1389 Encounter for screening for other disorder: Secondary | ICD-10-CM | POA: Diagnosis not present

## 2018-07-18 MED ORDER — NORGESTIMATE-ETH ESTRADIOL 0.25-35 MG-MCG PO TABS: 1.0000 | ORAL_TABLET | Freq: Every day | ORAL | 4 refills | Status: DC

## 2018-12-08 ENCOUNTER — Encounter: Payer: Self-pay | Admitting: Orthopaedic Surgery

## 2018-12-10 ENCOUNTER — Encounter: Payer: Self-pay | Admitting: Orthopaedic Surgery

## 2018-12-10 ENCOUNTER — Ambulatory Visit (INDEPENDENT_AMBULATORY_CARE_PROVIDER_SITE_OTHER): Payer: Medicaid Other

## 2018-12-10 ENCOUNTER — Ambulatory Visit (INDEPENDENT_AMBULATORY_CARE_PROVIDER_SITE_OTHER): Payer: Medicaid Other | Admitting: Orthopaedic Surgery

## 2018-12-10 ENCOUNTER — Ambulatory Visit: Payer: Self-pay

## 2018-12-10 ENCOUNTER — Other Ambulatory Visit: Payer: Self-pay

## 2018-12-10 DIAGNOSIS — M25531 Pain in right wrist: Secondary | ICD-10-CM

## 2018-12-10 DIAGNOSIS — M79644 Pain in right finger(s): Secondary | ICD-10-CM

## 2018-12-10 DIAGNOSIS — G8929 Other chronic pain: Secondary | ICD-10-CM

## 2018-12-10 DIAGNOSIS — M25562 Pain in left knee: Secondary | ICD-10-CM | POA: Diagnosis not present

## 2018-12-10 DIAGNOSIS — M25561 Pain in right knee: Secondary | ICD-10-CM

## 2018-12-10 DIAGNOSIS — M79672 Pain in left foot: Secondary | ICD-10-CM | POA: Diagnosis not present

## 2018-12-10 MED ORDER — MELOXICAM 7.5 MG PO TABS: 7.5000 mg | ORAL_TABLET | Freq: Two times a day (BID) | ORAL | 2 refills | Status: DC | PRN

## 2018-12-10 MED ORDER — DICLOFENAC SODIUM 1 % TD GEL: 2.0000 g | Freq: Four times a day (QID) | TRANSDERMAL | 5 refills | Status: DC

## 2018-12-10 NOTE — Progress Notes (Signed)
Office Visit Note   Patient: Roberta Reynolds           Date of Birth: 02/17/1981           MRN: [id number] Visit Date: 12/10/2018              Requested by: [place] BingPickens, Charlie, MD [address] [place] [id number] PCP: [place] BingPickens, Charlie, MD   Assessment & Plan: Visit Diagnoses:  1. Chronic pain of right knee   2. Pain in right wrist   3. Pain of left heel   4. Finger pain, right     Plan: Impression is #1 left plantar fasciitis #2 right knee patellofemoral pain #3 right index finger cyst.  For the left plantar fasciitis and right knee pain I have recommended outpatient physical therapy.  We also discussed home exercises for strengthening and stretching.  Patient declined cortisone injection for the plantar fasciitis.  Discussed with the patient that he could take up to a year for this to completely feel better.  For the finger patient is very worried that this could be an abnormal growth therefore we will obtain MRI to fully evaluate the soft tissue mass.  We will see her after the MRI.  Prescription for meloxicam and diclofenac gel sent to the pharmacy today.  Follow-Up Instructions: Return in about 2 weeks (around 12/24/2018).   Orders:  Orders Placed This Encounter  Procedures  . XR KNEE 3 VIEW RIGHT  . XR Wrist 2 Views Right  . XR Os Calcis Left  . XR Finger Index Right  . Ambulatory referral to Physical Therapy   Meds ordered this encounter  Medications  . meloxicam (MOBIC) 7.5 MG tablet    Sig: Take 1 tablet (7.5 mg total) by mouth 2 (two) times daily as needed for pain.    Dispense:  30 tablet    Refill:  2  . diclofenac sodium (VOLTAREN) 1 % GEL    Sig: Apply 2 g topically 4 (four) times daily.    Dispense:  1 Tube    Refill:  5      Procedures: No procedures performed   Clinical Data: No additional findings.   Subjective: Chief Complaint  Patient presents with  . Left Foot - Pain  . Right Knee - Pain  . Right Wrist - Pain    Patient is a  38 year old female comes in for evaluation of 3 issues.  First 1 is left heel pain for 2 months that is worse in the morning and after prolonged sitting.  The pain is on the plantar aspect of the heel.  Denies any numbness and tingling.  No pain on the Achilles.  Denies any injuries.  She has been trying to stretch the plantar fascia out with a ball and takes anti-inflammatories which gave her temporary relief.  In terms of her right knee she complains of anterior knee pain that is worse with knee flexion and with using stairs.  It hurts worse at night.  Denies any injuries.  Denies any swelling.  In terms of the right index finger she noticed a palpable cyst over the distal phalanx on the volar side.  She noticed that this could be growing over the last several months.  Denies any constitutional symptoms.  Denies any history of cancer.   Review of Systems  Constitutional: Negative.   HENT: Negative.   Eyes: Negative.   Respiratory: Negative.   Cardiovascular: Negative.   Endocrine: Negative.   Musculoskeletal: Negative.  Neurological: Negative.   Hematological: Negative.   Psychiatric/Behavioral: Negative.   All other systems reviewed and are negative.    Objective: Vital Signs: There were no vitals taken for this visit.  Physical Exam Vitals signs and nursing note reviewed.  Constitutional:      Appearance: She is well-developed.  Pulmonary:     Effort: Pulmonary effort is normal.  Skin:    General: Skin is warm.     Capillary Refill: Capillary refill takes less than 2 seconds.  Neurological:     Mental Status: She is alert and oriented to person, place, and time.  Psychiatric:        Behavior: Behavior normal.        Thought Content: Thought content normal.        Judgment: Judgment normal.     Ortho Exam Left heel exam shows tenderness on the plantar aspect.  Negative calcaneal compression.  Achilles is nontender.  No Achilles contracture. Right knee exam shows no joint  effusion.  No patellofemoral crepitus.  Patella mobility is normal.  Collaterals and cruciates are stable. Right index finger shows a palpable nodule over the distal phalanx on the volar side of the finger pulp.  There is no evidence of infection this is slightly tender to palpation slightly red.  She has normal finger function. Specialty Comments:  No specialty comments available.  Imaging: Xr Os Calcis Left  Result Date: 12/10/2018 Small plantar calcaneal spur.  No acute abnormalities.  Xr Knee 3 View Right  Result Date: 12/10/2018 No acute or structural abnormalities  Xr Wrist 2 Views Right  Result Date: 12/10/2018 No acute or structural abnormalities.    PMFS History: Patient Active Problem List   Diagnosis Date Noted  . Status post cesarean delivery 06/18/2018  . Cesarean delivery delivered 06/18/2018  . History of cesarean delivery 06/14/2018  . Nausea and vomiting of pregnancy, antepartum 05/29/2018  . 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 Lethargy 04/25/2018  . AMA (advanced maternal age) multigravida 35+ 04/02/2018  . Supervision of high-risk pregnancy of multigravida >47 years of age 19/14/2019  . Complication of anesthesia   . History of cesarean section x 2 04/30/2013   Past Medical History:  Diagnosis Date  . Complication of anesthesia    spinal heachache wants general anesthesia for CS  . Spinal headache    for four days    Family History  Problem Relation Age of Onset  . Cancer Maternal Aunt   . Cancer Paternal Uncle     Past Surgical History:  Procedure Laterality Date  . CESAREAN SECTION    . CESAREAN SECTION N/A 06/18/2018   Procedure: CESAREAN SECTION;  Surgeon: Aletha Halim, MD;  Location: [facility];  Service: Obstetrics;  Laterality: N/A;   Social History   Occupational History  . Not on file  Tobacco Use  . Smoking status: Never Smoker  . Smokeless tobacco: Never Used  Substance and Sexual Activity  . Alcohol use: No  . Drug use:  No  . Sexual activity: Yes    Partners: Male    Birth control/protection: None

## 2018-12-10 NOTE — Addendum Note (Signed)
Addended byLaurann Montana on: 12/10/2018 12:41 PM   Modules accepted: Orders

## 2018-12-19 ENCOUNTER — Ambulatory Visit: Payer: Medicaid Other | Attending: Orthopaedic Surgery | Admitting: Physical Therapy

## 2018-12-19 ENCOUNTER — Other Ambulatory Visit: Payer: Self-pay

## 2018-12-19 ENCOUNTER — Encounter: Payer: Self-pay | Admitting: Physical Therapy

## 2018-12-19 DIAGNOSIS — G8929 Other chronic pain: Secondary | ICD-10-CM | POA: Insufficient documentation

## 2018-12-19 DIAGNOSIS — M25561 Pain in right knee: Secondary | ICD-10-CM | POA: Diagnosis present

## 2018-12-19 DIAGNOSIS — M6281 Muscle weakness (generalized): Secondary | ICD-10-CM | POA: Insufficient documentation

## 2018-12-19 DIAGNOSIS — M79672 Pain in left foot: Secondary | ICD-10-CM

## 2018-12-19 NOTE — Therapy (Signed)
[place], [place], [address] Phone: [phone]   Fax:  [phone]  Physical Therapy Evaluation  Patient Details  Name: Roberta Reynolds MRN: [id number] Date of Birth: January 06, 1981 Referring Provider (PT): Dr Frankey Shown   Encounter Date: 12/19/2018  PT End of Session - 12/19/18 1418    Visit Number  1    Number of Visits  4    Date for PT Re-Evaluation  01/16/19    Authorization Type  MCD, requested 3 treatments through 01/16/2019    PT Start Time  1418   pt in late   PT Stop Time  1455    PT Time Calculation (min)  37 min       Past Medical History:  Diagnosis Date  . Complication of anesthesia    spinal heachache wants general anesthesia for CS  . Spinal headache    for four days    Past Surgical History:  Procedure Laterality Date  . CESAREAN SECTION    . CESAREAN SECTION N/A 06/18/2018   Procedure: CESAREAN SECTION;  Surgeon: Aletha Halim, MD;  Location: [facility];  Service: Obstetrics;  Laterality: N/A;    There were no vitals filed for this visit.   Subjective Assessment - 12/19/18 1418    Subjective  Pt reports she has had Rt knee pain for yrs and her Lt heel began bothering her about 6 months ago. Using medication and massaging her foot.    How long can you walk comfortably?  able to walk however sometimes it feels like a nail is going in her heel,  Her knee hurts at night and it feels unstable.    Patient Stated Goals  walk without pain    Currently in Pain?  Yes    Pain Score  3     Pain Location  Heel    Pain Orientation  Left    Pain Descriptors / Indicators  Aching         [facility] PT Assessment - 12/19/18 0001      Assessment   Medical Diagnosis  Rt knee and left heel pain    Referring Provider (PT)  Dr Frankey Shown    Onset Date/Surgical Date  06/19/18    Next MD Visit  PRN    Prior Therapy  none      Precautions   Precautions  None      Balance Screen   Has the patient fallen in the past 6 months  No      [place] residence    Living Arrangements  Spouse/significant other;Children    Home Layout  Two level   no trouble     Prior Function   Level of Independence  Independent    Vocation  --   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, 6 mon, 8, 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   Leisure  play with children, chase after them       Functional Tests   Functional tests  Squat;Single leg stance      Squat   Comments  bilat LE adduction      Posture/Postural Control   Posture Comments  pes planus, LE edema - pitting +1      ROM / Strength   AROM / PROM / Strength  AROM;Strength      AROM   AROM Assessment Site  Ankle;Knee    Right/Left Knee  --   bilat 0-135, extend to flex   Right/Left Ankle  --  DF Rt 12, Lt 9      Strength   Strength Assessment Site  Hip;Knee;Ankle    Right/Left Hip  --   bilat grossly 4/5, extension 4-/5   Right/Left Knee  --   WNL for break test, fair eccentric contol quads   Right/Left Ankle  --   WNL     Flexibility   Soft Tissue Assessment /Muscle Length  --   LE's WNL     Special Tests   Other special tests  (+) Rt patella compression                 Objective measurements completed on examination: See above findings.      [facility] Adult PT Treatment/Exercise - 12/19/18 0001      Self-Care   Self-Care  Other Self-Care Comments    Other Self-Care Comments   cross friction massage bottom of Lt foot      Exercises   Exercises  Knee/Hip      Knee/Hip Exercises: Standing   SLS  with FWD leans      Knee/Hip Exercises: Seated   Other Seated Knee/Hip Exercises  ankle pumps      Modalities   Modalities  Cryotherapy      Cryotherapy   Number Minutes Cryotherapy  6 Minutes    Cryotherapy Location  Other (comment)   plantar surface Lt foot   Type of Cryotherapy  Ice pack             PT Education - 12/19/18 1458    Education Details  HEP, POC and insurance    Person(s) Educated   Patient    Methods  Explanation;Demonstration;Handout    Comprehension  Returned demonstration;Verbalized understanding          PT Long Term Goals - 12/19/18 1502      PT LONG TERM GOAL #1   Title  I with HEP and verbalize understanding of equipment that would be good for her at home. ( 01/16/2019)    Baseline  has not exercised since pregnant with her last child, recently moved and wishes to set up a home gym    Time  4    Period  Weeks    Status  New    Target Date  01/16/19      PT LONG TERM GOAL #2   Title  improve bilat hip strength 5/5 to allow for proximal stability to decrease distal pressure with functional mobility ( 01/16/2019)    Baseline  bilat hip strength 4- to 4/5    Time  4    Period  Weeks    Status  New    Target Date  01/16/19      PT LONG TERM GOAL #3   Title  perform standing eccentric quad work with good control and no more than 1/10 Rt knee pain ( 01/16/2019)    Baseline  fair eccentric control and pain up to 5/10    Time  4    Period  Weeks    Status  New    Target Date  01/16/19      PT LONG TERM GOAL #4   Title  able to transition floor to stand to allow play with children with no more than 2/10 foot and knee pain    Baseline  has extreme pain with floor to stand transfers    Time  4    Period  Weeks    Status  New    Target Date  01/16/19             Plan - 12/19/18 1458    Clinical Impression Statement  38 yo female with recent flare up of Rt knee pain and 6 month onset of Lt heel/foot pain.  Her LE ROM and flexibility are good. She had her third child in December and had stopped all her exercise while she was pregnant.  She has significant weakness in her hips, functional weakness in her quads and edema with pain in the plantar surface and heel of the Lt foot. Her pain is limiting her ability to play with her children, walk and exercise.    Personal Factors and Comorbidities  Fitness    Examination-Activity Limitations   Sleep;Locomotion Level    Examination-Participation Restrictions  Other    Stability/Clinical Decision Making  Stable/Uncomplicated    Clinical Decision Making  Low    Rehab Potential  Excellent    PT Frequency  1x / week    PT Duration  4 weeks    PT Treatment/Interventions  Iontophoresis 4mg /ml Dexamethasone;Patient/family education;Moist Heat;Ultrasound;Therapeutic exercise;Balance training;Electrical Stimulation;Cryotherapy;Manual techniques    PT Next Visit Plan  hip strenthening, possible US to Lt foot and ionto.    Consulted and Agree with Plan of Care  Patient       Patient will benefit from skilled therapeutic intervention in order to improve the following deficits and impairments:  Difficulty walking, Pain, Decreased strength, Increased edema  Visit Diagnosis: 1. Muscle weakness (generalized)   2. Chronic pain of right knee   3. Pain in left foot        Problem List Patient Active Problem List   Diagnosis Date Noted  . Status post cesarean delivery 06/18/2018  . Cesarean delivery delivered 06/18/2018  . History of cesarean delivery 06/14/2018  . Nausea and vomiting of pregnancy, antepartum 05/29/2018  . 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 Lethargy 04/25/2018  . AMA (advanced maternal age) multigravida 35+ 04/02/2018  . Supervision of high-risk pregnancy of multigravida >40 years of age 78/14/2019  . Complication of anesthesia   . History of cesarean section x 2 04/30/2013    Roderic ScarceSusan Aideen Fenster PT  12/19/2018, 3:08 PM  [facility] [address] [place], [place], [address] Phone: [phone]   Fax:  [phone]  Name: Roberta Reynolds MRN: [id number] Date of Birth: 05/04/1981

## 2018-12-19 NOTE — Patient Instructions (Signed)
Access Code: [email or web address]  URL: [email or web address]  Date: 12/19/2018  Prepared by: Jeral Pinch   Exercises  Gastroc Stretch on Wall - 1 reps - 30 hold - 4x daily  Seated Ankle Pumps - 10 reps  Sit to Stand without Arm Support - 20 reps - 1x daily  Forward T - 10 reps - 1x daily    Massage tender spots in foot and ice foot 2-3 times a day, for 10-15 min

## 2018-12-20 ENCOUNTER — Encounter

## 2018-12-31 ENCOUNTER — Ambulatory Visit: Payer: Medicaid Other | Admitting: Physical Therapy

## 2019-01-07 ENCOUNTER — Other Ambulatory Visit: Payer: Self-pay

## 2019-01-07 ENCOUNTER — Ambulatory Visit: Payer: Medicaid Other | Attending: Orthopaedic Surgery

## 2019-01-07 DIAGNOSIS — M79672 Pain in left foot: Secondary | ICD-10-CM | POA: Insufficient documentation

## 2019-01-07 DIAGNOSIS — M25561 Pain in right knee: Secondary | ICD-10-CM | POA: Diagnosis present

## 2019-01-07 DIAGNOSIS — G8929 Other chronic pain: Secondary | ICD-10-CM

## 2019-01-07 NOTE — Patient Instructions (Signed)
Adduction: Hip - Knees Together (Hook-Lying)    Lie with hips and knees bent, towel roll between knees. Push knees together. Lift hips  Hold for _2__ seconds. Rest for _2__ seconds. Repeat _15-20__ times. Do _1__ times a day.   Copyright  VHI. All rights reserved.  Clam    Lie on side, legs bent 90. Open top knee to ceiling, rotating leg outward. Touch toes to ankle of bottom leg. Close knees, rotating leg inward. Maintain hip position. Repeat _12-20___ times. Repeat on other side. Do __1__ sessions per day.  USE BAND  http://pm.exer.us/69   Copyright  VHI. All rights reserved.  Bridging    Slowly raise buttocks from floor, keeping stomach tight. Repeat _15-20___ times per set. Do _1___ sets per session. Do __1HIP: Abduction - Side-Lying (Band)    Place band around legs. Squeeze glutes. Raise top leg up and slightly back. Hold __2_ seconds. Use ___blue_____ band. _12-20__ reps per set, _1__ sets per day, __5Gymball: Hip Hinge (Single Leg)    From sitting, roll out so ball supports shoulder blades, back straight. Straighten right leg. Other leg bent, knee over ankle, lower and raise hips. Repeat _10-15__ times. Repeat with other leg for set. Rest ___ seconds after set. Do _1_ sets per session.  http://plyo.exer.us/164   Copyright  VHI. All rights reserved.  _ days per week Bend bottom leg to stabilize pelvis.  Copyright  VHI. All rights reserved.  __ sessions per day.  http://orth.exer.us/1097   Copyright  VHI. All rights reserved.  Hip Extension: Hamstring Single Leg Deadlift (Eccentric)   Holding weights, stand on affected leg with knee slightly flexed. Lift other leg while slowly bending forward at the hip. Use ___ lb weight. ___ reps per set, ___ sets per day, ___ days per week. Add ___ lbs when you achieve ___ repetitions. Touch floor with weight.  Copyright  VHI. All rights reserved.  Squat: Wide Leg   Feet wide apart, toes out, squat, holding  weight between knees. Weight should be at ankles.  Bend at the hips with hips going back behind hips. Keep back straight.  Put eight on stool if you cannot get to floor.  Repeat _3-15___ times per set. Do ___1-2_ sets per session. Do __2-5__ sessions per week. Use __0-10__ lb weight.  Copyright  VHI. All rights reserved.  Squat: Half   Arms hanging at sides, squat by dropping hips back as if sitting on a chair. Keep knees over ankles, hips behind heels.  Keep back straight.  Bend at hips and knees will bend with hips.   Repeat ___3-15_ times per set. Do __1-2__ sets per session. Do __2-5__ sessions per week. Use __0-10HIP / KNEE: Flexion / Extension, Squat Unilateral Hip / Glute Extension: Standing - Straight Leg (Machine) Hip Extension (Standing) Healthy Back - Yoga Tree Balance

## 2019-01-07 NOTE — Therapy (Addendum)
[place], [place], [address] Phone: [phone]   Fax:  [phone]  Physical Therapy Treatment/Discharge  Patient Details  Name: Roberta Reynolds MRN: [id number] Date of Birth: 10/04/1980 Referring Provider (PT): Dr Frankey Shown   Encounter Date: 01/07/2019  PT End of Session - 01/07/19 1243    Visit Number  2    Number of Visits  4    Authorization Type  MCD    Authorization Time Period  3 visit through 01/12/19    Authorization - Visit Number  1    Authorization - Number of Visits  3    PT Start Time  5615    PT Stop Time  1330    PT Time Calculation (min)  45 min    Activity Tolerance  Patient tolerated treatment well    Behavior During Therapy  [facility] for tasks assessed/performed       Past Medical History:  Diagnosis Date  . Complication of anesthesia    spinal heachache wants general anesthesia for CS  . Spinal headache    for four days    Past Surgical History:  Procedure Laterality Date  . CESAREAN SECTION    . CESAREAN SECTION N/A 06/18/2018   Procedure: CESAREAN SECTION;  Surgeon: Aletha Halim, MD;  Location: [facility];  Service: Obstetrics;  Laterality: N/A;    There were no vitals filed for this visit.  Subjective Assessment - 01/07/19 1245    Subjective  She   reports pain is same. Some times worse.   She She does not do Exercise daily.   Ices  but not daily   Last night was painful but not now.    Currently in Pain?  No/denies    Pain Location  Heel   RT knee                      [facility] Adult PT Treatment/Exercise - 01/07/19 0001      Knee/Hip Exercises: Stretches   Gastroc Stretch  Right;Left;30 seconds;2 reps    Gastroc Stretch Limitations  oncde with runner's stretch and one off step bilaterally      Knee/Hip Exercises: Standing   SLS  hip hinge RT and Lt cued for alighnment      Knee/Hip Exercises: Seated   Other Seated Knee/Hip Exercises   ankle pumps    Sit to Sand  20 reps;without UE support      Knee/Hip Exercises: Supine   Bridges  Both;15 reps    Bridges with [employer]  Both;15 reps    Bridges with Clamshell  Both;15 reps   green band   Single Leg Bridge  Left;Right;5 reps      Knee/Hip Exercises: Sidelying   Hip ABduction  Right;Left;10 reps    Clams  and reverse clams x 12 with blue band             PT Education - 01/07/19 1313    Education Details  HEP    Person(s) Educated  Patient    Methods  Explanation;Tactile cues;Verbal cues;Handout    Comprehension  Returned demonstration;Verbalized understanding          PT Long Term Goals - 12/19/18 1502      PT LONG TERM GOAL #1   Title  I with HEP and verbalize understanding of equipment that would be good for her at home. ( 01/16/2019)    Baseline  has not exercised since pregnant with her  last child, recently moved and wishes to set up a home gym    Time  4    Period  Weeks    Status  New    Target Date  01/16/19      PT LONG TERM GOAL #2   Title  improve bilat hip strength 5/5 to allow for proximal stability to decrease distal pressure with functional mobility ( 01/16/2019)    Baseline  bilat hip strength 4- to 4/5    Time  4    Period  Weeks    Status  New    Target Date  01/16/19      PT LONG TERM GOAL #3   Title  perform standing eccentric quad work with good control and no more than 1/10 Rt knee pain ( 01/16/2019)    Baseline  fair eccentric control and pain up to 5/10    Time  4    Period  Weeks    Status  New    Target Date  01/16/19      PT LONG TERM GOAL #4   Title  able to transition floor to stand to allow play with children with no more than 2/10 foot and knee pain    Baseline  has extreme pain with floor to stand transfers    Time  4    Period  Weeks    Status  New    Target Date  01/16/19            Plan - 01/07/19 1244    PT Treatment/Interventions  Iontophoresis 37m/ml Dexamethasone;Patient/family  education;Moist Heat;Ultrasound;Therapeutic exercise;Balance training;Electrical Stimulation;Cryotherapy;Manual techniques    PT Next Visit Plan  hip strenthening, possible [place] Lt foot and ionto.    PT Home Exercise Plan  clam , reverse clam,   single leg hip hinge,  bridge , hip adduction with bridge    Consulted and Agree with Plan of Care  Patient       Patient will benefit from skilled therapeutic intervention in order to improve the following deficits and impairments:  Difficulty walking, Pain, Decreased strength, Increased edema  Visit Diagnosis: 1. Chronic pain of right knee   2. Pain in left foot        Problem List Patient Active Problem List   Diagnosis Date Noted  . Status post cesarean delivery 06/18/2018  . Cesarean delivery delivered 06/18/2018  . History of cesarean delivery 06/14/2018  . Nausea and vomiting of pregnancy, antepartum 05/29/2018  . 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 Lethargy 04/25/2018  . AMA (advanced maternal age) multigravida 35+ 04/02/2018  . Supervision of high-risk pregnancy of multigravida >351years of age 39/14/2019  . Complication of anesthesia   . History of cesarean section x 2 04/30/2013    CDarrel Hoover PT 01/07/2019, 1:36 PM  [facility] [address]Phone: [phone]  Fax:  [phone] Name: Roberta McwrightMRN: [id number] of Birth: [birth year] PHYSICAL THERAPY DISCHARGE SUMMARY  Visits from Start of Care: 2  Current functional level related to goals / functional outcomes: Unknown as she no showed appointments and she needed to return to get Medicaid extension for PT and did not   Remaining deficits: Unknown   Education / Equipment: HEP  Plan:  Patient goals were not met. Patient is being discharged due to not returning since the last visit.  ?????    Pearson Forster PT   04/07/19

## 2019-01-09 ENCOUNTER — Ambulatory Visit: Payer: Medicaid Other

## 2019-01-11 ENCOUNTER — Encounter: Payer: Self-pay | Admitting: Orthopaedic Surgery

## 2019-01-13 MED ORDER — AMOXICILLIN-POT CLAVULANATE 875-125 MG PO TABS: 1.0000 | ORAL_TABLET | Freq: Two times a day (BID) | ORAL | 0 refills | Status: DC

## 2019-01-13 NOTE — Telephone Encounter (Signed)
Please send in augmentin [phone] bid x 10 day.  Thanks.

## 2019-01-15 ENCOUNTER — Encounter: Payer: Self-pay | Admitting: *Deleted

## 2019-01-20 ENCOUNTER — Ambulatory Visit: Payer: Medicaid Other

## 2019-01-23 ENCOUNTER — Ambulatory Visit: Payer: Medicaid Other

## 2019-01-27 ENCOUNTER — Ambulatory Visit: Payer: Medicaid Other | Attending: Orthopaedic Surgery

## 2019-01-27 ENCOUNTER — Telehealth: Payer: Self-pay | Admitting: Physical Therapy

## 2019-01-27 NOTE — Telephone Encounter (Signed)
Message left about 3  No show appointments. I stated she could call and schedule 1 appointment to have re-eval and request for  Medicaid extension.

## 2019-02-02 ENCOUNTER — Encounter: Payer: Self-pay | Admitting: Orthopaedic Surgery

## 2019-02-05 ENCOUNTER — Other Ambulatory Visit: Payer: Self-pay | Admitting: Orthopaedic Surgery

## 2019-02-05 ENCOUNTER — Telehealth: Payer: Self-pay | Admitting: Orthopedic Surgery

## 2019-02-05 ENCOUNTER — Ambulatory Visit
Admission: RE | Admit: 2019-02-05 | Discharge: 2019-02-05 | Disposition: A | Discharge status: Home / Self Care | Payer: Medicaid Other | Source: Ambulatory Visit | Attending: Orthopaedic Surgery | Admitting: Orthopaedic Surgery

## 2019-02-05 DIAGNOSIS — M79644 Pain in right finger(s): Secondary | ICD-10-CM

## 2019-02-05 NOTE — Telephone Encounter (Signed)
Called patient left message to return call to make an appointment with Dr Sharol Given for her foot/toe pain

## 2019-02-06 ENCOUNTER — Telehealth: Payer: Self-pay | Admitting: Orthopedic Surgery

## 2019-02-06 NOTE — Telephone Encounter (Signed)
Called patient left message to return call to schedule an appointment with Dr Sharol Given for her foot/toe

## 2019-02-07 ENCOUNTER — Other Ambulatory Visit: Payer: Medicaid Other

## 2019-02-10 ENCOUNTER — Encounter: Payer: Self-pay | Admitting: Orthopedic Surgery

## 2019-02-10 ENCOUNTER — Ambulatory Visit (INDEPENDENT_AMBULATORY_CARE_PROVIDER_SITE_OTHER): Payer: Medicaid Other | Admitting: Orthopedic Surgery

## 2019-02-10 VITALS — Ht 64.0 in | Wt 169.0 lb

## 2019-02-10 DIAGNOSIS — L03032 Cellulitis of left toe: Secondary | ICD-10-CM | POA: Diagnosis not present

## 2019-02-11 ENCOUNTER — Encounter: Payer: Self-pay | Admitting: Orthopedic Surgery

## 2019-02-11 ENCOUNTER — Other Ambulatory Visit: Payer: Self-pay

## 2019-02-11 NOTE — Progress Notes (Signed)
Office Visit Note   Patient: Roberta Reynolds           Date of Birth: 10/15/1980           MRN: [id number] Visit Date: 02/10/2019              Requested by: Roberta Reynolds, Charlie, MD [address] [place],  [place] [id number] PCP: [place] Reynolds, Charlie, MD  Chief Complaint  Patient presents with  . Right Foot - Pain  . Left Foot - Pain    Toenail Excision 02/10/2019      HPI: Patient is a 38 year old woman who is seen in referral from Dr. Roda Reynolds.  Patient complains of a painful purulent drainage ingrown toenail on the left great toe.  Patient states she is previously had her right great toenail excised and she states that it does not grow properly now.  Assessment & Plan: Visit Diagnoses:  1. Paronychia of great toe of left foot     Plan: Patient will start antibiotic ointment dressing changes tomorrow wear the postoperative shoe until she can fit into a regular sneaker follow-up in 1 week.  Follow-Up Instructions: Return in about 1 week (around 02/17/2019).   Ortho Exam  Patient is alert, oriented, no adenopathy, well-dressed, normal affect, normal respiratory effort. Examination patient left lower extremity is neurovascular intact she has good pulses she has an ingrown toenail of the lateral border left great toe with cellulitis tenderness to palpation and purulent drainage.  After informed consent and sterile prepping with Betadine patient underwent a digital block with 10 cc of 1% lidocaine plain after last adequate levels anesthesia were obtained a Roberta Reynolds was used to free up the lateral border of the left great toenail scissors were used to cut off the lateral border this was removed the infection was decompressed there was good healthy tissue at the base no signs of residual infection.  A sterile dressing was Xeroform was applied.  Imaging: No results found. No images are attached to the encounter.  Labs: No results found for: HGBA1C, ESRSEDRATE, CRP, LABURIC, REPTSTATUS, GRAMSTAIN,  CULT, LABORGA   No results found for: ALBUMIN, PREALBUMIN, LABURIC  Lab Results  Component Value Date   MG 1.8 04/02/2018   No results found for: VD25OH  No results found for: PREALBUMIN CBC EXTENDED Latest Ref Rng & Units 06/19/2018 06/17/2018 04/02/2018  WBC 4.0 - 10.5 K/uL 11.3(H) 9.5 9.9  RBC 3.87 - 5.11 MIL/uL 3.70(L) 4.49 4.34  HGB 12.0 - 15.0 g/dL 6.6(Y9.3(L) 11.3(L) 10.5(L)  HCT 36.0 - 46.0 % 28.1(L) 33.9(L) 32.6(L)  PLT 150 - 400 K/uL 233 260 295  NEUTROABS 1.4 - 7.0 x10E3/uL - - -  LYMPHSABS 0.7 - 3.1 x10E3/uL - - -     Body mass index is 29.01 kg/m.  Orders:  No orders of the defined types were placed in this encounter.  No orders of the defined types were placed in this encounter.    Procedures: No procedures performed  Clinical Data: No additional findings.  ROS:  All other systems negative, except as noted in the HPI. Review of Systems  Objective: Vital Signs: Ht 5\' 4"  (1.626 m)   Wt 169 lb (76.7 kg)   BMI 29.01 kg/m   Specialty Comments:  No specialty comments available.  PMFS History: Patient Active Problem List   Diagnosis Date Noted  . Status post cesarean delivery 06/18/2018  . Cesarean delivery delivered 06/18/2018  . History of cesarean delivery 06/14/2018  . Nausea and vomiting of pregnancy,  antepartum 05/29/2018  . 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 Lethargy 04/25/2018  . AMA (advanced maternal age) multigravida 35+ 04/02/2018  . Supervision of high-risk pregnancy of multigravida >35 years of age 53/14/2019  . Complication of anesthesia   . History of cesarean section x 2 04/30/2013   Past Medical History:  Diagnosis Date  . Complication of anesthesia    spinal heachache wants general anesthesia for CS  . Spinal headache    for four days    Family History  Problem Relation Age of Onset  . Cancer Maternal Aunt   . Cancer Paternal Uncle     Past Surgical History:  Procedure Laterality Date  . CESAREAN SECTION    . CESAREAN  SECTION N/A 06/18/2018   Procedure: CESAREAN SECTION;  Surgeon: Roberta Halim, MD;  Location: [facility];  Service: Obstetrics;  Laterality: N/A;   Social History   Occupational History  . Not on file  Tobacco Use  . Smoking status: Never Smoker  . Smokeless tobacco: Never Used  Substance and Sexual Activity  . Alcohol use: No  . Drug use: No  . Sexual activity: Yes    Partners: Male    Birth control/protection: None

## 2019-02-25 ENCOUNTER — Ambulatory Visit: Payer: Medicaid Other | Admitting: Orthopedic Surgery

## 2019-04-05 ENCOUNTER — Emergency Department (HOSPITAL_COMMUNITY): Payer: Medicaid Other

## 2019-04-05 ENCOUNTER — Other Ambulatory Visit: Payer: Self-pay

## 2019-04-05 ENCOUNTER — Emergency Department (HOSPITAL_COMMUNITY)
Admission: EM | Admit: 2019-04-05 | Discharge: 2019-04-05 | Disposition: A | Discharge status: Home / Self Care | Payer: Medicaid Other | Attending: Emergency Medicine | Admitting: Emergency Medicine

## 2019-04-05 DIAGNOSIS — W230XXA Caught, crushed, jammed, or pinched between moving objects, initial encounter: Secondary | ICD-10-CM | POA: Insufficient documentation

## 2019-04-05 DIAGNOSIS — Y999 Unspecified external cause status: Secondary | ICD-10-CM | POA: Diagnosis not present

## 2019-04-05 DIAGNOSIS — S62632B Displaced fracture of distal phalanx of right middle finger, initial encounter for open fracture: Secondary | ICD-10-CM | POA: Diagnosis not present

## 2019-04-05 DIAGNOSIS — S6991XA Unspecified injury of right wrist, hand and finger(s), initial encounter: Secondary | ICD-10-CM | POA: Diagnosis present

## 2019-04-05 DIAGNOSIS — S62636A Displaced fracture of distal phalanx of right little finger, initial encounter for closed fracture: Secondary | ICD-10-CM | POA: Insufficient documentation

## 2019-04-05 DIAGNOSIS — Z79899 Other long term (current) drug therapy: Secondary | ICD-10-CM | POA: Insufficient documentation

## 2019-04-05 DIAGNOSIS — Y939 Activity, unspecified: Secondary | ICD-10-CM | POA: Diagnosis not present

## 2019-04-05 DIAGNOSIS — Y929 Unspecified place or not applicable: Secondary | ICD-10-CM | POA: Insufficient documentation

## 2019-04-05 DIAGNOSIS — R52 Pain, unspecified: Secondary | ICD-10-CM

## 2019-04-05 MED ORDER — OXYCODONE-ACETAMINOPHEN 5-325 MG PO TABS
1.0000 | ORAL_TABLET | Freq: Once | ORAL | Status: AC
  Administered 2019-04-05: 1 via ORAL
  Filled 2019-04-05: qty 1

## 2019-04-05 MED ORDER — IBUPROFEN 400 MG PO TABS
600.0000 mg | ORAL_TABLET | Freq: Once | ORAL | Status: AC
  Administered 2019-04-05: 600 mg via ORAL
  Filled 2019-04-05: qty 1

## 2019-04-05 MED ORDER — CEPHALEXIN 500 MG PO CAPS: 500.0000 mg | ORAL_CAPSULE | Freq: Two times a day (BID) | ORAL | 0 refills | Status: AC

## 2019-04-05 MED ORDER — OXYCODONE-ACETAMINOPHEN 5-325 MG PO TABS: 1.0000 | ORAL_TABLET | Freq: Four times a day (QID) | ORAL | 0 refills | Status: DC | PRN

## 2019-04-05 NOTE — ED Triage Notes (Signed)
Pt shut right hand in bedroom door PTA.Mainly effecting the 3rd and 4th finger.

## 2019-04-05 NOTE — ED Notes (Signed)
Discharge instructions, including prescription and follow up care, discussed with pt. Pt verbalized understanding with no questions at this time.  ?

## 2019-04-05 NOTE — Progress Notes (Signed)
Orthopedic Tech Progress Note Patient Details:  Roberta Reynolds 1981-03-25 [id number]  Ortho Devices Type of Ortho Device: Finger splint, Buddy tape Ortho Device/Splint Location: rue 3 and 4 finger. Ortho Device/Splint Interventions: Ordered, Application, Adjustment   Post Interventions Patient Tolerated: Well Instructions Provided: Care of device, Adjustment of device   Karolee Stamps 04/05/2019, 8:52 PM

## 2019-04-05 NOTE — ED Provider Notes (Addendum)
[facility] Provider Note   CSN: [id number] Arrival date & time: 04/05/19  1649     History   Chief Complaint Chief Complaint  Patient presents with  . Hand Injury    HPI Roberta Reynolds is a 38 y.o. female who presents to the ED today complaining of sudden onset, constant, sharp, 10/10,  right 3rd and 4th finger pain after accidentally slamming hand in a door PTA. Pt has not taken anything for pain prior to coming to the ED. No previous injury to same fingers. Tetanus up to date. Pt has no other complaints at this time.        Past Medical History:  Diagnosis Date  . Complication of anesthesia    spinal heachache wants general anesthesia for CS  . Spinal headache    for four days    Patient Active Problem List   Diagnosis Date Noted  . Status post cesarean delivery 06/18/2018  . Cesarean delivery delivered 06/18/2018  . History of cesarean delivery 06/14/2018  . Nausea and vomiting of pregnancy, antepartum 05/29/2018  . 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 Lethargy 04/25/2018  . AMA (advanced maternal age) multigravida 35+ 04/02/2018  . Supervision of high-risk pregnancy of multigravida >40 years of age 19/14/2019  . Complication of anesthesia   . History of cesarean section x 2 04/30/2013    Past Surgical History:  Procedure Laterality Date  . CESAREAN SECTION    . CESAREAN SECTION N/A 06/18/2018   Procedure: CESAREAN SECTION;  Surgeon: [place] BingPickens, Charlie, MD;  [facility];  Service: Obstetrics;  Laterality: N/A;     OB History    Gravida  4   Para  3   Term  3   Preterm  0   AB  1   Living  3     SAB  1   TAB  0   Ectopic  0   Multiple  0   Live Births  3        Obstetric Comments  Unable to tolerate epidural - Needs general anesthesia only         Home Medications    Prior to Admission medications   Medication Sig Start Date End Date Taking? Authorizing Provider   amoxicillin-clavulanate (AUGMENTIN) 875-125 MG tablet Take 1 tablet by mouth 2 (two) times daily. 01/13/19   Tarry KosXu, Naiping M, MD  cephALEXin (KEFLEX) 500 MG capsule Take 1 capsule (500 mg total) by mouth 2 (two) times daily for 7 days. 04/05/19 04/12/19  Hyman HopesVenter, Augustin Bun, PA-C  diclofenac sodium (VOLTAREN) 1 % GEL Apply 2 g topically 4 (four) times daily. 12/10/18   Tarry KosXu, Naiping M, MD  ibuprofen (ADVIL,MOTRIN) 600 MG tablet Take 1 tablet (600 mg total) by mouth every 6 (six) hours as needed for mild pain or cramping. 06/20/18   [place] BingPickens, Charlie, MD  meloxicam (MOBIC) 7.5 MG tablet Take 1 tablet (7.5 mg total) by mouth 2 (two) times daily as needed for pain. 12/10/18   Tarry KosXu, Naiping M, MD  Multiple Vitamin (MULTIVITAMIN) tablet Take 1 tablet by mouth daily.    [provider]  norgestimate-ethinyl estradiol (ORTHO-CYCLEN,SPRINTEC,PREVIFEM) 0.25-35 MG-MCG tablet Take 1 tablet by mouth daily. 07/18/18   Anyanwu, Jethro BastosUgonna A, MD  oxyCODONE-acetaminophen (PERCOCET/ROXICET) 5-325 MG tablet Take 1 tablet by mouth every 6 (six) hours as needed for moderate pain. 06/20/18   [place] BingPickens, Charlie, MD  oxyCODONE-acetaminophen (PERCOCET/ROXICET) 5-325 MG tablet Take 1 tablet by mouth every 6 (six) hours as needed  for severe pain. 04/05/19   Josseline Reddin, PA-C  senna-docusate (SENOKOT-S) 8.6-50 MG tablet Take 2 tablets by mouth at bedtime as needed for mild constipation. 06/20/18   Aletha Halim, MD    Family History Family History  Problem Relation Age of Onset  . Cancer Maternal Aunt   . Cancer Paternal Uncle     Social History Social History   Tobacco Use  . Smoking status: Never Smoker  . Smokeless tobacco: Never Used  Substance Use Topics  . Alcohol use: No  . Drug use: No     Allergies   Patient has no known allergies.   Review of Systems Review of Systems  Musculoskeletal: Positive for arthralgias.  Skin: Positive for wound.     Physical Exam Updated Vital Signs BP 132/78 (BP  Location: Left Arm)   Pulse 92   Temp 97.8 F (36.6 C) (Oral)   Resp 20   SpO2 97%   Physical Exam Vitals signs and nursing note reviewed.  Constitutional:      Appearance: She is not ill-appearing.  HENT:     Head: Normocephalic and atraumatic.  Eyes:     Conjunctiva/sclera: Conjunctivae normal.  Cardiovascular:     Rate and Rhythm: Normal rate and regular rhythm.  Pulmonary:     Effort: Pulmonary effort is normal.     Breath sounds: Normal breath sounds.  Musculoskeletal:     Comments: Superficial laceration noted to right 3rd finger along DIP joint with ecchymosis and TTP. TTP also present along 4th DIP joint. ROM limited due to pain. ROM intact to MCP and PIP joints of same fingers. No tenderness to metacarpals. 2+ radial pulse.   Skin:    General: Skin is warm and dry.     Coloration: Skin is not jaundiced.  Neurological:     Mental Status: She is alert.      ED Treatments / Results  Labs (all labs ordered are listed, but only abnormal results are displayed) Labs Reviewed - No data to display  EKG None  Radiology Dg Hand Complete Right  Result Date: 04/05/2019 CLINICAL DATA:  Pt shut right hand in bedroom door PTA. Mainly effecting the 3rd and 4th finger. Pt has swelling and bruising and bleeding. EXAM: RIGHT HAND - COMPLETE 3+ VIEW COMPARISON:  None. FINDINGS: There is a nondisplaced oblique fracture at the base of the distal phalanx of the fourth digit. There is also a nondisplaced transverse fracture at the base of the distal phalanx of the third digit best seen on the lateral view. No evidence of dislocation. No additional acute osseous abnormality. No significant arthropathy. Regional soft tissues are unremarkable. IMPRESSION: Nondisplaced fractures at the bases of the third and fourth distal phalanges of the right hand. Electronically Signed   By: Audie Pinto M.D.   On: 04/05/2019 18:10    Procedures Procedures (including critical care time)   Medications Ordered in ED Medications  ibuprofen (ADVIL) tablet 600 mg (600 mg Oral Given 04/05/19 1733)  oxyCODONE-acetaminophen (PERCOCET/ROXICET) 5-325 MG per tablet 1 tablet (1 tablet Oral Given 04/05/19 1834)     Initial Impression / Assessment and Plan / ED Course  I have reviewed the triage vital signs and the nursing notes.  Pertinent labs & imaging results that were available during my care of the patient were reviewed by me and considered in my medical decision making (see chart for details).    38 year old female who presents to the ED today after getting her 3rd  and 4th fingers slammed in a door just PTA. Does have small laceration noted to the DIP joint of the 3rd finger with TTP and ecchymosis. Will obtain xrays at this time. Tetanus up to date. Have given ibuprofen in the ED for pain relief.   Xrays positive for fractures at the base of the distal phalanx on both 3rd and 4th digits. Considering pt has a small laceration technically counts as an "open fracture." Will cover with abx. Finger splints applied. Pt is to wear splints for 2-4 weeks. I have prescribed a short course of pain medication for her as well. PDMP reviewed; no concerns. Pt discharged home at this time. Will have her follow up with hand surgery.   This note was prepared using Dragon voice recognition software and may include unintentional dictation errors due to the inherent limitations of voice recognition software.        Final Clinical Impressions(s) / ED Diagnoses   Final diagnoses:  Displaced fracture of distal phalanx of right middle finger, initial encounter for open fracture  Displaced fracture of distal phalanx of right little finger, initial encounter for closed fracture    ED Discharge Orders         Ordered    cephALEXin (KEFLEX) 500 MG capsule  2 times daily     04/05/19 1918    oxyCODONE-acetaminophen (PERCOCET/ROXICET) 5-325 MG tablet  Every 6 hours PRN     04/05/19 1918            Tanda Rockers, PA-C 04/05/19 1921    Hyman Hopes, [place], PA-C 04/05/19 1932    Pricilla Loveless, MD 04/06/19 [phone]

## 2019-04-05 NOTE — Discharge Instructions (Addendum)
Your xray was positive for breaks of the middle and ring finger on your right hand We have placed you in splints in the ED - please wear these to prevent any bending of your fingers. These will need to be kept on for at least 2 weeks but at most 4 weeks  Please follow up with Dr. Amedeo Plenty  I have prescribed a short course of pain medication for you to take as needed - do not breast feed with on this medication. I have also prescribed antibiotics to cover you for any infection

## 2019-04-05 NOTE — ED Notes (Signed)
Ortho at bedside.

## 2019-04-05 NOTE — ED Notes (Signed)
Ortho paged for finger splint

## 2019-04-24 ENCOUNTER — Encounter: Payer: Self-pay | Admitting: Radiology

## 2019-04-29 ENCOUNTER — Encounter: Payer: Self-pay | Admitting: Radiology

## 2019-05-13 ENCOUNTER — Other Ambulatory Visit: Payer: Self-pay | Admitting: Physical Medicine and Rehabilitation

## 2019-05-13 DIAGNOSIS — E041 Nontoxic single thyroid nodule: Secondary | ICD-10-CM

## 2019-05-16 ENCOUNTER — Other Ambulatory Visit: Payer: Self-pay | Admitting: Physical Medicine and Rehabilitation

## 2019-05-16 DIAGNOSIS — M546 Pain in thoracic spine: Secondary | ICD-10-CM

## 2019-05-26 ENCOUNTER — Ambulatory Visit
Admission: RE | Admit: 2019-05-26 | Discharge: 2019-05-26 | Disposition: A | Discharge status: Home / Self Care | Payer: Medicaid Other | Source: Ambulatory Visit | Attending: Physical Medicine and Rehabilitation | Admitting: Physical Medicine and Rehabilitation

## 2019-05-26 DIAGNOSIS — E041 Nontoxic single thyroid nodule: Secondary | ICD-10-CM

## 2019-07-01 ENCOUNTER — Other Ambulatory Visit: Payer: Self-pay | Admitting: Surgery

## 2019-07-01 DIAGNOSIS — E041 Nontoxic single thyroid nodule: Secondary | ICD-10-CM

## 2019-07-10 ENCOUNTER — Other Ambulatory Visit (HOSPITAL_COMMUNITY)
Admission: RE | Admit: 2019-07-10 | Discharge: 2019-07-10 | Disposition: A | Discharge status: Home / Self Care | Payer: Medicaid Other | Source: Ambulatory Visit | Attending: Surgery | Admitting: Surgery

## 2019-07-10 ENCOUNTER — Ambulatory Visit
Admission: RE | Admit: 2019-07-10 | Discharge: 2019-07-10 | Disposition: A | Discharge status: Home / Self Care | Payer: Medicaid Other | Source: Ambulatory Visit | Attending: Surgery | Admitting: Surgery

## 2019-07-10 DIAGNOSIS — E041 Nontoxic single thyroid nodule: Secondary | ICD-10-CM | POA: Diagnosis present

## 2019-07-11 LAB — CYTOLOGY - NON PAP

## 2019-07-24 ENCOUNTER — Encounter (HOSPITAL_COMMUNITY): Payer: Self-pay

## 2019-10-24 ENCOUNTER — Emergency Department (HOSPITAL_COMMUNITY)
Admission: EM | Admit: 2019-10-24 | Discharge: 2019-10-25 | Disposition: A | Discharge status: Home / Self Care | Payer: Medicaid Other | Attending: Emergency Medicine | Admitting: Emergency Medicine

## 2019-10-24 ENCOUNTER — Emergency Department (HOSPITAL_COMMUNITY): Payer: Medicaid Other

## 2019-10-24 ENCOUNTER — Encounter (HOSPITAL_COMMUNITY): Payer: Self-pay | Admitting: Emergency Medicine

## 2019-10-24 ENCOUNTER — Other Ambulatory Visit: Payer: Self-pay

## 2019-10-24 ENCOUNTER — Ambulatory Visit: Payer: Self-pay

## 2019-10-24 DIAGNOSIS — K8020 Calculus of gallbladder without cholecystitis without obstruction: Secondary | ICD-10-CM | POA: Diagnosis not present

## 2019-10-24 DIAGNOSIS — R109 Unspecified abdominal pain: Secondary | ICD-10-CM | POA: Diagnosis present

## 2019-10-24 DIAGNOSIS — R1011 Right upper quadrant pain: Secondary | ICD-10-CM

## 2019-10-24 LAB — COMPREHENSIVE METABOLIC PANEL
ALT: 37 U/L (ref 0–44)
AST: 90 U/L — ABNORMAL HIGH (ref 15–41)
Albumin: 3.8 g/dL (ref 3.5–5.0)
Alkaline Phosphatase: 59 U/L (ref 38–126)
Anion gap: 12 (ref 5–15)
BUN: 9 mg/dL (ref 6–20)
CO2: 24 mmol/L (ref 22–32)
Calcium: 9.2 mg/dL (ref 8.9–10.3)
Chloride: 104 mmol/L (ref 98–111)
Creatinine, Ser: 0.68 mg/dL (ref 0.44–1.00)
GFR calc Af Amer: >60 mL/min (ref >60)
GFR calc non Af Amer: >60 mL/min (ref >60)
Glucose, Bld: 118 mg/dL — ABNORMAL HIGH (ref 70–99)
Potassium: 3.8 mmol/L (ref 3.5–5.1)
Sodium: 140 mmol/L (ref 135–145)
Total Bilirubin: 0.9 mg/dL (ref 0.3–1.2)
Total Protein: 6.8 g/dL (ref 6.5–8.1)

## 2019-10-24 LAB — CBC
HCT: 38 % (ref 36.0–46.0)
Hemoglobin: 12.2 g/dL (ref 12.0–15.0)
MCH: 23.2 pg — ABNORMAL LOW (ref 26.0–34.0)
MCHC: 32.1 g/dL (ref 30.0–36.0)
MCV: 72.4 fL — ABNORMAL LOW (ref 80.0–100.0)
Platelets: 317 10*3/uL (ref 150–400)
RBC: 5.25 MIL/uL — ABNORMAL HIGH (ref 3.87–5.11)
RDW: 14.3 % (ref 11.5–15.5)
WBC: 10 10*3/uL (ref 4.0–10.5)
nRBC: 0 % (ref 0.0–0.2)

## 2019-10-24 LAB — I-STAT BETA HCG BLOOD, ED (MC, WL, AP ONLY): I-stat hCG, quantitative: <5 m[IU]/mL (ref <5)

## 2019-10-24 LAB — URINALYSIS, ROUTINE W REFLEX MICROSCOPIC
Bacteria, UA: NONE SEEN
Bilirubin Urine: NEGATIVE
Glucose, UA: NEGATIVE mg/dL
Ketones, ur: NEGATIVE mg/dL
Leukocytes,Ua: NEGATIVE
Nitrite: NEGATIVE
Protein, ur: NEGATIVE mg/dL
RBC / HPF: >50 RBC/hpf — ABNORMAL HIGH (ref 0–5)
Specific Gravity, Urine: 1.012 (ref 1.005–1.030)
pH: 9 — ABNORMAL HIGH (ref 5.0–8.0)

## 2019-10-24 LAB — LIPASE, BLOOD: Lipase: 35 U/L (ref 11–51)

## 2019-10-24 MED ORDER — SODIUM CHLORIDE 0.9% FLUSH: 3.0000 mL | Freq: Once | INTRAVENOUS | Status: DC

## 2019-10-24 MED ORDER — IOHEXOL 300 MG/ML  SOLN
100.0000 mL | Freq: Once | INTRAMUSCULAR | Status: AC | PRN
  Administered 2019-10-24: 18:00:00 100 mL via INTRAVENOUS

## 2019-10-24 MED ORDER — SODIUM CHLORIDE 0.9 % IV BOLUS
1000.0000 mL | Freq: Once | INTRAVENOUS | Status: AC
  Administered 2019-10-24: 16:00:00 1000 mL via INTRAVENOUS

## 2019-10-24 MED ORDER — GADOBUTROL 1 MMOL/ML IV SOLN
9.0000 mL | Freq: Once | INTRAVENOUS | Status: AC | PRN
  Administered 2019-10-24: 9 mL via INTRAVENOUS

## 2019-10-24 MED ORDER — FAMOTIDINE 20 MG PO TABS
20.0000 mg | ORAL_TABLET | Freq: Two times a day (BID) | ORAL | 0 refills | Status: DC
Start: 2019-10-24 — End: 2019-12-15

## 2019-10-24 MED ORDER — ONDANSETRON HCL 4 MG/2ML IJ SOLN
4.0000 mg | Freq: Once | INTRAMUSCULAR | Status: AC
  Administered 2019-10-24: 16:00:00 4 mg via INTRAVENOUS
  Filled 2019-10-24: qty 2

## 2019-10-24 MED ORDER — ALUM & MAG HYDROXIDE-SIMETH 200-200-20 MG/5ML PO SUSP
30.0000 mL | Freq: Once | ORAL | Status: AC
  Administered 2019-10-24: 16:00:00 30 mL via ORAL
  Filled 2019-10-24: qty 30

## 2019-10-24 MED ORDER — LIDOCAINE VISCOUS HCL 2 % MT SOLN
15.0000 mL | Freq: Once | OROMUCOSAL | Status: AC
  Administered 2019-10-24: 16:00:00 15 mL via ORAL
  Filled 2019-10-24: qty 15

## 2019-10-24 MED ORDER — LORAZEPAM 2 MG/ML IJ SOLN: 0.5000 mg | Freq: Once | INTRAMUSCULAR | Status: DC

## 2019-10-24 NOTE — Telephone Encounter (Signed)
Patient husband called stating that his wife was having pain in her abdomin.  He placed his wife on the phon and she reported the pain was 10 ans in the center of her chest between her lungs. She states it was difficult to breath. No triage was completed and patient and her husband were told to go to ER now. I told her husband to call 911 if she was having difficulty breathing. He stated that she was fine and he could drive her to the ER.   Answer Assessment - Initial Assessment Questions 1. LOCATION: "Where does it hurt?"      Center between lungs 2. RADIATION: "Does the pain shoot anywhere else?" (e.g., chest, back)     *No Answer* 3. ONSET: "When did the pain begin?" (e.g., minutes, hours or days ago)      *No Answer* 4. SUDDEN: "Gradual or sudden onset?"     *No Answer* 5. PATTERN "Does the pain come and go, or is it constant?"    - If constant: "Is it getting better, staying the same, or worsening?"      (Note: Constant means the pain never goes away completely; most serious pain is constant and it progresses)     - If intermittent: "How long does it last?" "Do you have pain now?"     (Note: Intermittent means the pain goes away completely between bouts)     *No Answer* 6. SEVERITY: "How bad is the pain?"  (e.g., Scale 1-10; mild, moderate, or severe)    - MILD (1-3): doesn't interfere with normal activities, abdomen soft and not tender to touch     - MODERATE (4-7): interferes with normal activities or awakens from sleep, tender to touch     - SEVERE (8-10): excruciating pain, doubled over, unable to do any normal activities      10 7. RECURRENT SYMPTOM: "Have you ever had this type of abdominal pain before?" If so, ask: "When was the last time?" and "What happened that time?"      *No Answer* 8. AGGRAVATING FACTORS: "Does anything seem to cause this pain?" (e.g., foods, stress, alcohol)     *No Answer* 9. CARDIAC SYMPTOMS: "Do you have any of the following symptoms: chest pain,  difficulty breathing, sweating, nausea?"    Difficult breathing 10. OTHER SYMPTOMS: "Do you have any other symptoms?" (e.g., fever, vomiting, diarrhea)       *No Answer* 11. PREGNANCY: "Is there any chance you are pregnant?" "When was your last menstrual period?"       *No Answer*  Protocols used: ABDOMINAL PAIN - UPPER-A-AH

## 2019-10-24 NOTE — ED Provider Notes (Signed)
[facility] EMERGENCY DEPARTMENT Provider Note   CSN: [id number] Arrival date & time: 10/24/19  1441   History Chief Complaint  Patient presents with   Abdominal Pain   Roberta Reynolds is a 39 y.o. female with past medical history who presents for evaluation of abdominal pain.  Patient states she had episode abdominal pain earlier this morning after eating breakfast.  Took an ibuprofen which brought her pain from an 8/10 to a 4/10.  States when she gets this pain it is described as stabbing and then she will get a burning sensation into her chest.  States she was able to eat prior to this over the last 4 days without difficulty.  Did have one episode after drinking water 5 days ago which self resolved.  She denies any chronic NSAID use.  States when she lays flat she does get a burning sensation up into her chest.  Had similar symptoms when she was pregnant with her child.  Denies any chronic alcohol use.  Rates her current pain a 3/10. Denies radiation of pain into back, RUQ, jaw, left arm.  No associated diaphoresis.  Denies recent injury, travel, malignancy, unilateral leg swelling, redness or warmth. NO hx of PE, DVT. Denies palpitations.  Denies any hemoptysis, melena, bright red blood per rectum.  She is passing gas and has "normal" bowel movements.  Denies additional aggravating or relieving factors.  Declines anything for pain at this time however is nauseous.  History obtained from patient past and past medical record.  No interpreter is used.  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   Lethargy 04/25/2018   AMA (advanced maternal age) multigravida 35+ 04/02/2018   Supervision of high-risk pregnancy of multigravida >19 years of age 63/14/2019   Complication of anesthesia    History of cesarean section x 2 04/30/2013    Past Surgical History:  Procedure Laterality Date   CESAREAN SECTION     CESAREAN SECTION N/A 06/18/2018   Procedure: CESAREAN SECTION;  Surgeon: [place] Bing, MD;  Location: [facility] BIRTHING SUITES;  Service: Obstetrics;  Laterality: N/A;     OB History    Gravida  4   Para  3   Term  3   Preterm  0   AB  1   Living  3     SAB  1   TAB  0   Ectopic  0   Multiple  0   Live Births  3        Obstetric Comments  Unable to tolerate epidural - Needs general anesthesia only        Family History  Problem Relation Age of Onset   Cancer Maternal Aunt    Cancer Paternal Uncle     Social History   Tobacco Use   Smoking status: Never Smoker   Smokeless tobacco: Never Used  Substance Use Topics   Alcohol use: No   Drug use: No    Home Medications Prior to Admission medications   Medication Sig Start Date End Date Taking? Authorizing  Provider  ibuprofen (ADVIL) 200 MG tablet Take 400 mg by mouth every 6 (six) hours as needed (for cramps and headaches).   Yes [provider]  Multiple Vitamins-Minerals (ONE-A-DAY WOMENS PO) Take 1 tablet by mouth daily with breakfast.   Yes [provider]  norgestimate-ethinyl estradiol ([place] 28) 0.25-35 MG-MCG tablet Take 1 tablet by mouth See admin instructions. Take 1 tablet by mouth once a day only on non-cycle days   Yes [provider]  amoxicillin-clavulanate (AUGMENTIN) 875-125 MG tablet Take 1 tablet by mouth 2 (two) times daily. Patient not taking: Reported on 10/24/2019 01/13/19   Leandrew Koyanagi, MD  diclofenac sodium (VOLTAREN) 1 % GEL Apply 2 g topically 4 (four) times daily. Patient not taking: Reported on 10/24/2019  12/10/18   Leandrew Koyanagi, MD  famotidine (PEPCID) 20 MG tablet Take 1 tablet (20 mg total) by mouth 2 (two) times daily. 10/24/19   Lanier Millon A, PA-C  ibuprofen (ADVIL,MOTRIN) 600 MG tablet Take 1 tablet (600 mg total) by mouth every 6 (six) hours as needed for mild pain or cramping. Patient not taking: Reported on 10/24/2019 06/20/18   Aletha Halim, MD  meloxicam (MOBIC) 7.5 MG tablet Take 1 tablet (7.5 mg total) by mouth 2 (two) times daily as needed for pain. Patient not taking: Reported on 10/24/2019 12/10/18   Leandrew Koyanagi, MD  norgestimate-ethinyl estradiol (ORTHO-CYCLEN,SPRINTEC,PREVIFEM) 0.25-35 MG-MCG tablet Take 1 tablet by mouth daily. Patient not taking: Reported on 10/24/2019 07/18/18   Osborne Oman, MD  oxyCODONE-acetaminophen (PERCOCET/ROXICET) 5-325 MG tablet Take 1 tablet by mouth every 6 (six) hours as needed for moderate pain. Patient not taking: Reported on 10/24/2019 06/20/18   Aletha Halim, MD  oxyCODONE-acetaminophen (PERCOCET/ROXICET) 5-325 MG tablet Take 1 tablet by mouth every 6 (six) hours as needed for severe pain. Patient not taking: Reported on 10/24/2019 04/05/19   Eustaquio Maize, PA-C  senna-docusate (SENOKOT-S) 8.6-50 MG tablet Take 2 tablets by mouth at bedtime as needed for mild constipation. Patient not taking: Reported on 10/24/2019 06/20/18   Aletha Halim, MD    Allergies    Other, Chaney Born, and Sprintec 28 [norgestimate-eth estradiol]  Review of Systems   Review of Systems  Constitutional: Negative.   HENT: Negative.   Eyes: Negative.   Respiratory: Negative.   Cardiovascular: Negative.   Gastrointestinal: Positive for abdominal pain, nausea and vomiting. Negative for abdominal distention, anal bleeding, blood in stool, constipation, diarrhea and rectal pain.  Genitourinary: Negative.   Musculoskeletal: Negative.   Skin: Negative.   Neurological: Negative.   All other systems reviewed and are negative.   Physical Exam Updated  Vital Signs BP 119/76    Pulse 73    Temp 98.1 F (36.7 C) (Oral)    Resp 16    Ht 5\' 5"  (1.651 m)    Wt 85 kg    SpO2 100%    BMI 31.18 kg/m   Physical Exam Vitals and nursing note reviewed.  Constitutional:      General: She is not in acute distress.    Appearance: She is well-developed. She is not ill-appearing, toxic-appearing or diaphoretic.  HENT:     Head: Normocephalic and atraumatic.     Mouth/Throat:     Mouth: Mucous membranes are moist.     Pharynx: Oropharynx is clear.  Eyes:     Pupils: Pupils are equal, round, and reactive to light.  Cardiovascular:     Rate and Rhythm: Normal rate.     Heart  sounds: Normal heart sounds.  Pulmonary:     Effort: Pulmonary effort is normal. No respiratory distress.     Breath sounds: Normal breath sounds.     Comments: Speaks in full sentences without difficulty.  Clear to auscultation bilateral without wheeze, rhonchi or rales. Abdominal:     General: Abdomen is flat. Bowel sounds are normal. There is no distension.     Palpations: Abdomen is soft.     Tenderness: There is abdominal tenderness in the epigastric area. There is no right CVA tenderness, left CVA tenderness, guarding or rebound. Negative signs include Murphy's sign and McBurney's sign.     Hernia: No hernia is present.  Musculoskeletal:        General: Normal range of motion.     Cervical back: Normal range of motion.  Skin:    General: Skin is warm and dry.     Capillary Refill: Capillary refill takes less than 2 seconds.     Comments: Brisk cap refill  Neurological:     Mental Status: She is alert.    ED Results / Procedures / Treatments   Labs (all labs ordered are listed, but only abnormal results are displayed) Labs Reviewed  COMPREHENSIVE METABOLIC PANEL - Abnormal; Notable for the following components:      Result Value   Glucose, Bld 118 (*)    AST 90 (*)    All other components within normal limits  CBC - Abnormal; Notable for the following  components:   RBC 5.25 (*)    MCV 72.4 (*)    MCH 23.2 (*)    All other components within normal limits  URINALYSIS, ROUTINE W REFLEX MICROSCOPIC - Abnormal; Notable for the following components:   APPearance HAZY (*)    pH 9.0 (*)    Hgb urine dipstick MODERATE (*)    RBC / HPF >50 (*)    All other components within normal limits  LIPASE, BLOOD  I-STAT BETA HCG BLOOD, ED ([facility], [facility], [facility] ONLY)    EKG EKG Interpretation  Date/Time:  Friday October 24 2019 14:47:26 EDT Ventricular Rate:  75 PR Interval:  146 QRS Duration: 76 QT Interval:  406 QTC Calculation: 453 R Axis:   67 Text Interpretation: Normal sinus rhythm Normal ECG Confirmed by Marianna Fuss ([id number]) on 10/24/2019 3:22:29 PM   Radiology CT Abdomen Pelvis W Contrast  Result Date: 10/24/2019 CLINICAL DATA:  Right upper quadrant and epigastric pain EXAM: CT ABDOMEN AND PELVIS WITH CONTRAST TECHNIQUE: Multidetector CT imaging of the abdomen and pelvis was performed using the standard protocol following bolus administration of intravenous contrast. CONTRAST:  OMNIPAQUE IOHEXOL 300 MG/ML  SOLN COMPARISON:  None. FINDINGS: Lower chest: Small air cyst noted in the right lower lobe. Lung bases are otherwise clear. Normal heart size. No pericardial effusion. Hepatobiliary: Subcentimeter hypoattenuating focus in the posterior right lobe liver (segment VII, 3/15) too small to fully characterize on CT imaging but statistically likely benign. No worrisome liver lesion. Mild intra and extrahepatic biliary ductal dilatation with some heterogeneous attenuation within the dilated distal common bile duct which could reflect intraductal gallstones. Additional partially calcified gallstones are present within the distended gallbladder as well as some layering hyperdense material possibly reflecting biliary sludge. Question some mild gallbladder wall thickening. Pancreas: Unremarkable. No pancreatic ductal dilatation or surrounding inflammatory  changes. Spleen: Normal in size without focal abnormality. Adrenals/Urinary Tract: Adrenal glands are unremarkable. Kidneys are normal, without renal calculi, focal lesion, or hydronephrosis. Bladder is unremarkable. Stomach/Bowel: Distal  esophagus, stomach and duodenal sweep are unremarkable. No small bowel wall thickening or dilatation. No evidence of obstruction. A normal appendix is visualized. No colonic dilatation or wall thickening. Vascular/Lymphatic: Minimal plaque within the right common iliac artery. Abdominal aorta is normal caliber. No suspicious or enlarged lymph nodes in the included lymphatic chains. Reproductive: Normal appearance of the uterus and adnexal structures. Normal follicles in the right ovary. Other: Postsurgical changes from prior low transverse incision, compatible with history of Caesarean section. No abdominopelvic free fluid or free gas. No bowel containing hernias. Small fat containing umbilical hernia. Musculoskeletal: Mild vacuum phenomenon at the bilateral SI joints with extensive sclerotic changes of the sacral ala and ilia, most compatible with osteitis condensans ilii. No other acute or suspicious osseous abnormalities. Minimal discogenic changes in the spine. IMPRESSION: 1. Intra and extrahepatic biliary ductal dilatation with attenuation in the distended common bile duct possibly reflecting noncalcified/partially calcified gallstones. Additional calcified noncalcified gallstones within the distended gallbladder with mild gallbladder wall thickening. Recommend further assessment right upper quadrant ultrasound. MRCP may also be necessary if there are clinical features of choledocholithiasis not identified on the ultrasound. 2. Postsurgical changes from prior low transverse incision, compatible with history of Caesarean section. 3. Sclerotic changes of the sacral ala and ilia, most compatible with osteitis condensans ilii, a typically benign process which is usually  asymptomatic though can present with lower back pain. These results were called by telephone at the time of interpretation on 10/24/2019 at 6:41 pm to provider [facility] , who verbally acknowledged these results. Electronically Signed   By: Kreg Shropshire M.D.   On: 10/24/2019 18:42   US Abdomen Limited RUQ  Result Date: 10/24/2019 CLINICAL DATA:  Right upper quadrant pain nausea EXAM: ULTRASOUND ABDOMEN LIMITED RIGHT UPPER QUADRANT COMPARISON:  None. FINDINGS: Gallbladder: Multiple shadowing gallstones are present the largest measuring 2.7 cm. The gallbladder wall thickness is 2.8 mm. No sonographic Murphy sign. No pericholecystic fluid. Common bile duct: Diameter: 4 mm Liver: No focal lesion identified. Within normal limits in parenchymal echogenicity. Portal vein is patent on color Doppler imaging with normal direction of blood flow towards the liver. Other: None. IMPRESSION: Cholelithiasis without evidence of cholecystitis. Electronically Signed   By: Jonna Clark M.D.   On: 10/24/2019 20:51    Procedures Procedures (including critical care time)  Medications Ordered in ED Medications  sodium chloride flush (NS) 0.9 % injection 3 mL (3 mLs Intravenous Not Given 10/24/19 1742)  LORazepam (ATIVAN) injection 0.5 mg (0.5 mg Intravenous Refused 10/25/19 0001)  sodium chloride 0.9 % bolus 1,000 mL (0 mLs Intravenous Stopped 10/24/19 1700)  ondansetron (ZOFRAN) injection 4 mg (4 mg Intravenous Given 10/24/19 1549)  alum & mag hydroxide-simeth (MAALOX/MYLANTA) 200-200-20 MG/5ML suspension 30 mL (30 mLs Oral Given 10/24/19 1549)    And  lidocaine (XYLOCAINE) 2 % viscous mouth solution 15 mL (15 mLs Oral Given 10/24/19 1550)  iohexol (OMNIPAQUE) 300 MG/ML solution 100 mL (100 mLs Intravenous Contrast Given 10/24/19 1818)  gadobutrol (GADAVIST) 1 MMOL/ML injection 9 mL (9 mLs Intravenous Contrast Given 10/24/19 2337)   ED Course  I have reviewed the triage vital signs and the nursing notes.  Pertinent  labs & imaging results that were available during my care of the patient were reviewed by me and considered in my medical decision making (see chart for details).  39 year old presents for evaluation of epigastric pain.  Afebrile, nonseptic, not ill-appearing.  Has had intermittent epigastric pain.  No radiation.  Is  also had some nausea.  Describes sensation of burning into her chest when she lays flat at night.  She has no unilateral leg swelling, redness or warmth.  No pleuritic or exertional chest pain.  Does not radiate into her back, jaw or left arm.  No associated diaphoresis.  She is without tachycardia, tachypnea or hypoxia.  No evidence of DVT on exam.  She is PERC negative, Wells criteria low risk.  Symptoms do not seem consistent with atypical ACS, dissection.  Negative Murphy sign.    Labs personally reviewed and interpreted: Pregnancy test negative Lipase 35 CBC without leukocytosis, hemoglobin 12.2 Metabolic panel with mild hyperglycemia to 118, AST 90, no additional LFT elevation.  Patient denies any chronic alcohol use. UA negative for infection CT AP with a couple stones as well as extrahepatic and intrahepatic duct dilation.  Concern for possibly mildly thickened gallbladder wall and concern for possible stone in duct versus thick sludge.  Recommend right upper quadrant ultrasound.  Radiology states that they do not see any evidence of gross choledocholithiasis on ultrasound he would recommend MRCP to assess for obstructing stone.  1850: Patient reassessed. Pain 1/10.  Discussed plan for ultrasound and possible MRCP.  She is agreeable to this.  Does not need anything additional for pain at this time.  2100: Patient reassessed.  Discussed negative ultrasound without evidence of cystitis.  Per radiology recommendations will obtain MRCP to rule out choledocholithiasis.  Reevaluation abdomen soft with negative Murphy sign.  States her pain is a 1/10, he does not need anything additional  for emesis or pain.  States she would like to go home if testing is negative as she has young children.  0000: Patient reassessed.  Tolerating p.o. intake without difficulty. Wet read from Radiology indicate no stone in ducts and no inflammation.  Discussed imaging findings with patient.  Low suspicion for cholecystitis, choledocholithiasis or cholangitis.  Discussed symptomatic management at home.  She will follow-up outpatient with general surgery and her PCP.  Has opiates at home for her chronic back pain.  Patient is nontoxic, nonseptic appearing, in no apparent distress.  Patient's pain and other symptoms adequately managed in emergency department.  Fluid bolus given.  Labs, imaging and vitals reviewed.  Patient does not meet the SIRS or Sepsis criteria.  On repeat exam patient does not have a surgical abdomin and there are no peritoneal signs.  No indication of appendicitis, bowel obstruction, bowel perforation, cholecystitis, diverticulitis, PID or ectopic pregnancy.  Patient discharged home with symptomatic treatment and given strict instructions for follow-up with their primary care physician.  I have also discussed reasons to return immediately to the ER.  Patient expresses understanding and agrees with plan.   Discussed plan with attending, Dr. Stevie Kernykstra who is agreement with plan and disposition.   MDM Rules/Calculators/A&P                       Final Clinical Impression(s) / ED Diagnoses Final diagnoses:  RUQ pain  Gallstones    Rx / DC Orders ED Discharge Orders         Ordered    famotidine (PEPCID) 20 MG tablet  2 times daily     10/24/19 2333           Sevana Grandinetti A, PA-C 10/25/19 0008    Milagros Lollykstra, Richard S, MD 10/25/19 1141

## 2019-10-24 NOTE — Discharge Instructions (Signed)
Follow up with the surgeon above.  Return for new or worsening symptoms.

## 2019-10-24 NOTE — ED Triage Notes (Signed)
Pt reports upper abd pain that began last week, she reports she took ibuprofen for it and it got better, today she reports that she ate breakfast this morning and then she began having sharp pain to upper abdomen.

## 2019-10-24 NOTE — ED Notes (Signed)
Taken to CT.

## 2019-10-25 NOTE — ED Notes (Signed)
Pt discharged from ED; instructions provided and scripts given; Pt encouraged to return to ED if symptoms worsen and to f/u with PCP; Pt verbalized understanding of all instructions 

## 2019-10-28 ENCOUNTER — Other Ambulatory Visit: Payer: Self-pay | Admitting: Surgery

## 2019-10-30 ENCOUNTER — Encounter (HOSPITAL_COMMUNITY): Payer: Self-pay | Admitting: Surgery

## 2019-10-30 ENCOUNTER — Other Ambulatory Visit: Payer: Self-pay

## 2019-11-01 ENCOUNTER — Other Ambulatory Visit (HOSPITAL_COMMUNITY)
Admission: RE | Admit: 2019-11-01 | Discharge: 2019-11-01 | Disposition: A | Discharge status: Home / Self Care | Payer: Medicaid Other | Source: Ambulatory Visit | Attending: Surgery | Admitting: Surgery

## 2019-11-01 DIAGNOSIS — Z20822 Contact with and (suspected) exposure to covid-19: Secondary | ICD-10-CM | POA: Insufficient documentation

## 2019-11-01 DIAGNOSIS — Z01812 Encounter for preprocedural laboratory examination: Secondary | ICD-10-CM | POA: Insufficient documentation

## 2019-11-01 LAB — SARS CORONAVIRUS 2 (TAT 6-24 HRS): SARS Coronavirus 2: NEGATIVE

## 2019-11-04 NOTE — H&P (Signed)
Roberta Reynolds Appointment: 10/28/2019 10:20 AM Location: [facility] Patient #: [id number] DOB: 1981/04/08 Married / Language: English / Race: Undefined Female   History of Present Illness (Rajvi Armentor A. Ninfa Linden MD; 10/28/2019 10:55 AM) The patient is a 39 year old female who presents for evaluation of gall stones.  Chief complaint: Epigastric and right upper quadrant abdominal pain  This is a 39 year old female referred by the emergency department for abdominal pain and gallstones. She has been having several attacks over the past year of epigastric pain related to her back. Most recently, she developed an attack of severe pain in the epigastrium and right upper quadrant occurring due to the back. She had nausea and vomiting. She went to the emergency department. She underwent a CAT scan of the abdomen and pelvis showing intra-and extra hepatic biliary ductal dilation. She then had an ultrasound showing multiple gallstones with the largest being 2.7 cm and a thickened gallbladder wall. She then underwent an MRCP showing gallstones with a 3 mm, bile duct and no further ductal dilation and no common bile duct stones. At that time, her white blood count was normal, her bilirubin and alkaline phosphatase were normal, her lipase is normal. She is still having some pain today although it is improved and she is on a low-fat diet. She has been having intermittent pain almost daily. She denies jaundice. Bowel movements are normal. Again, the pain has been moderate to severe at times   Allergies Malachi Bonds, CMA; 10/28/2019 10:18 AM) No Known Allergies  [08/01/2019]: No Known Drug Allergies  [06/30/2019]:  Medication History (Chemira Jones, CMA; 10/28/2019 10:19 AM) Meloxicam (7.5MG  Tablet, Oral) Active. Famotidine (20MG  Tablet, Oral) Active. Medications Reconciled  Vitals (Chemira Jones CMA; 10/28/2019 10:18 AM) 10/28/2019 10:18 AM Weight: 186.2 lb Height: 64in Body Surface  Area: 1.9 m Body Mass Index: 31.96 kg/m  Pulse: 80 (Regular)  BP: 114/78(Sitting, Left Arm, Standard)       Physical Exam (Charlena Haub A. Ninfa Linden MD; 10/28/2019 10:55 AM) The physical exam findings are as follows: Note: She is slightly anxious today but is otherwise normal appearance  Her abdomen is soft. There is very minimal tenderness with guarding in the right upper quadrant and epigastric area.  I reviewed all her notes in [place] (Ezri Landers A. Ninfa Linden MD; 10/28/2019 10:57 AM) SYMPTOMATIC CHOLELITHIASIS (K80.20) Impression: This is a patient was sent to medical. Again, I reviewed her CAT scan, ultrasound, and MRCP. I suspect that at some point she had a stone in her bile duct which is since passed. I discussed the diagnosis of gallstones with her. We discussed the reasons for proceeding with a cholecystectomy. I believe she is high risk for further cholecystitis or common bile duct stones and she could still have a stone floating in the bile duct which was missed on MRCP. I strongly recommend proceeding with a laparoscopic cholecystectomy and cholangiogram. I discussed the procedure in detail. The patient was given Neurosurgeon. We discussed the risks and benefits of a laparoscopic cholecystectomy and cholangiogram including, but not limited to, bleeding, infection, injury to surrounding structures such as the intestine or liver, bile leak, retained gallstones, need to convert to an open procedure, prolonged diarrhea, blood clots such as DVT, common bile duct injury, anesthesia risks, and possible need for additional procedures. The likelihood of improvement in symptoms and return to the patient's normal status is good. We discussed the typical post-operative recovery course. All questions were answered.  The surgery will be  scheduled urgently.

## 2019-11-04 NOTE — Anesthesia Preprocedure Evaluation (Addendum)
Anesthesia Evaluation  Patient identified by MRN, date of birth, ID band Patient awake    Reviewed: Allergy & Precautions, NPO status , Patient's Chart, lab work & pertinent test results  History of Anesthesia Complications (+) POST - OP SPINAL HEADACHE and history of anesthetic complications  Airway Mallampati: II  TM Distance: >3 FB Neck ROM: Full    Dental no notable dental hx.    Pulmonary neg pulmonary ROS,    Pulmonary exam normal breath sounds clear to auscultation       Cardiovascular negative cardio ROS Normal cardiovascular exam Rhythm:Regular Rate:Normal     Neuro/Psych  Headaches, PSYCHIATRIC DISORDERS Anxiety Depression    GI/Hepatic Neg liver ROS, GERD  Medicated and Controlled,  Endo/Other  negative endocrine ROS  Renal/GU negative Renal ROS     Musculoskeletal negative musculoskeletal ROS (+)   Abdominal (+) + obese,   Peds  Hematology negative hematology ROS (+)   Anesthesia Other Findings SYMPTOMATIC GALLSTONES  Reproductive/Obstetrics                            Anesthesia Physical Anesthesia Plan  ASA: II  Anesthesia Plan: General   Post-op Pain Management:    Induction: Intravenous  PONV Risk Score and Plan: 4 or greater and Scopolamine patch - Pre-op, Midazolam, Dexamethasone, Ondansetron and Treatment may vary due to age or medical condition  Airway Management Planned: Oral ETT  Additional Equipment:   Intra-op Plan:   Post-operative Plan: Extubation in OR  Informed Consent: I have reviewed the patients History and Physical, chart, labs and discussed the procedure including the risks, benefits and alternatives for the proposed anesthesia with the patient or authorized representative who has indicated his/her understanding and acceptance.     Dental advisory given  Plan Discussed with: CRNA  Anesthesia Plan Comments:        Anesthesia Quick  Evaluation

## 2019-11-05 ENCOUNTER — Other Ambulatory Visit: Payer: Self-pay

## 2019-11-05 ENCOUNTER — Ambulatory Visit (HOSPITAL_COMMUNITY): Payer: Medicaid Other | Admitting: Certified Registered Nurse Anesthetist

## 2019-11-05 ENCOUNTER — Ambulatory Visit (HOSPITAL_COMMUNITY): Payer: Medicaid Other

## 2019-11-05 ENCOUNTER — Ambulatory Visit (HOSPITAL_COMMUNITY)
Admission: RE | Admit: 2019-11-05 | Discharge: 2019-11-05 | Disposition: A | Discharge status: Home / Self Care | Payer: Medicaid Other | Attending: Surgery | Admitting: Surgery

## 2019-11-05 ENCOUNTER — Encounter (HOSPITAL_COMMUNITY)
Admission: RE | Disposition: A | Discharge status: Home / Self Care | Payer: Self-pay | Source: Home / Self Care | Attending: Surgery

## 2019-11-05 ENCOUNTER — Encounter (HOSPITAL_COMMUNITY): Payer: Self-pay | Admitting: Surgery

## 2019-11-05 DIAGNOSIS — Z6831 Body mass index (BMI) 31.0-31.9, adult: Secondary | ICD-10-CM | POA: Insufficient documentation

## 2019-11-05 DIAGNOSIS — F419 Anxiety disorder, unspecified: Secondary | ICD-10-CM | POA: Insufficient documentation

## 2019-11-05 DIAGNOSIS — Z419 Encounter for procedure for purposes other than remedying health state, unspecified: Secondary | ICD-10-CM

## 2019-11-05 DIAGNOSIS — E669 Obesity, unspecified: Secondary | ICD-10-CM | POA: Insufficient documentation

## 2019-11-05 DIAGNOSIS — Z793 Long term (current) use of hormonal contraceptives: Secondary | ICD-10-CM | POA: Diagnosis not present

## 2019-11-05 DIAGNOSIS — K8010 Calculus of gallbladder with chronic cholecystitis without obstruction: Secondary | ICD-10-CM | POA: Diagnosis not present

## 2019-11-05 DIAGNOSIS — K219 Gastro-esophageal reflux disease without esophagitis: Secondary | ICD-10-CM | POA: Diagnosis not present

## 2019-11-05 DIAGNOSIS — F329 Major depressive disorder, single episode, unspecified: Secondary | ICD-10-CM | POA: Diagnosis not present

## 2019-11-05 DIAGNOSIS — Z79899 Other long term (current) drug therapy: Secondary | ICD-10-CM | POA: Insufficient documentation

## 2019-11-05 HISTORY — DX: Depression, unspecified: F32.A

## 2019-11-05 HISTORY — PX: CHOLECYSTECTOMY: SHX55

## 2019-11-05 HISTORY — DX: Gastro-esophageal reflux disease without esophagitis: K21.9

## 2019-11-05 HISTORY — DX: Dyspnea, unspecified: R06.00

## 2019-11-05 LAB — PREGNANCY, URINE: Preg Test, Ur: NEGATIVE

## 2019-11-05 SURGERY — LAPAROSCOPIC CHOLECYSTECTOMY WITH INTRAOPERATIVE CHOLANGIOGRAM
Anesthesia: General | Site: Abdomen

## 2019-11-05 MED ORDER — OXYCODONE HCL 5 MG/5ML PO SOLN: 5.0000 mg | Freq: Once | ORAL | Status: AC | PRN

## 2019-11-05 MED ORDER — BUPIVACAINE HCL (PF) 0.25 % IJ SOLN
INTRAMUSCULAR | Status: AC
  Filled 2019-11-05: qty 30

## 2019-11-05 MED ORDER — DEXAMETHASONE SODIUM PHOSPHATE 10 MG/ML IJ SOLN
INTRAMUSCULAR | Status: DC | PRN
  Administered 2019-11-05: 10 mg via INTRAVENOUS

## 2019-11-05 MED ORDER — GABAPENTIN 300 MG PO CAPS
300.0000 mg | ORAL_CAPSULE | ORAL | Status: AC
  Administered 2019-11-05: 300 mg via ORAL
  Filled 2019-11-05: qty 1

## 2019-11-05 MED ORDER — LACTATED RINGERS IV SOLN: INTRAVENOUS | Status: DC

## 2019-11-05 MED ORDER — DEXAMETHASONE SODIUM PHOSPHATE 10 MG/ML IJ SOLN
INTRAMUSCULAR | Status: AC
  Filled 2019-11-05: qty 1

## 2019-11-05 MED ORDER — BUPIVACAINE HCL (PF) 0.25 % IJ SOLN
INTRAMUSCULAR | Status: DC | PRN
  Administered 2019-11-05: 20 mL

## 2019-11-05 MED ORDER — PROPOFOL 10 MG/ML IV BOLUS
INTRAVENOUS | Status: AC
  Filled 2019-11-05: qty 20

## 2019-11-05 MED ORDER — PROPOFOL 10 MG/ML IV BOLUS
INTRAVENOUS | Status: DC | PRN
  Administered 2019-11-05: 150 mg via INTRAVENOUS

## 2019-11-05 MED ORDER — FENTANYL CITRATE (PF) 250 MCG/5ML IJ SOLN
INTRAMUSCULAR | Status: DC | PRN
  Administered 2019-11-05 (×2): 50 ug via INTRAVENOUS
  Administered 2019-11-05: 100 ug via INTRAVENOUS
  Administered 2019-11-05: 50 ug via INTRAVENOUS

## 2019-11-05 MED ORDER — LIDOCAINE 2% (20 MG/ML) 5 ML SYRINGE
INTRAMUSCULAR | Status: DC | PRN
  Administered 2019-11-05: 60 mg via INTRAVENOUS

## 2019-11-05 MED ORDER — OXYCODONE HCL 5 MG PO TABS
ORAL_TABLET | ORAL | Status: AC
  Filled 2019-11-05: qty 1

## 2019-11-05 MED ORDER — ENSURE PRE-SURGERY PO LIQD
296.0000 mL | Freq: Once | ORAL | Status: DC
  Filled 2019-11-05: qty 296

## 2019-11-05 MED ORDER — ONDANSETRON HCL 4 MG/2ML IJ SOLN
INTRAMUSCULAR | Status: DC | PRN
  Administered 2019-11-05: 4 mg via INTRAVENOUS

## 2019-11-05 MED ORDER — ROCURONIUM BROMIDE 10 MG/ML (PF) SYRINGE
PREFILLED_SYRINGE | INTRAVENOUS | Status: AC
  Filled 2019-11-05: qty 10

## 2019-11-05 MED ORDER — OXYCODONE HCL 5 MG PO TABS
5.0000 mg | ORAL_TABLET | Freq: Once | ORAL | Status: AC | PRN
  Administered 2019-11-05: 5 mg via ORAL

## 2019-11-05 MED ORDER — HYDROMORPHONE HCL 1 MG/ML IJ SOLN
INTRAMUSCULAR | Status: AC
  Filled 2019-11-05: qty 1

## 2019-11-05 MED ORDER — LACTATED RINGERS IV SOLN
INTRAVENOUS | Status: DC | PRN
  Administered 2019-11-05: 1000 mL

## 2019-11-05 MED ORDER — 0.9 % SODIUM CHLORIDE (POUR BTL) OPTIME
TOPICAL | Status: DC | PRN
  Administered 2019-11-05: 1000 mL

## 2019-11-05 MED ORDER — ACETAMINOPHEN 500 MG PO TABS
1000.0000 mg | ORAL_TABLET | ORAL | Status: AC
  Administered 2019-11-05: 1000 mg via ORAL
  Filled 2019-11-05: qty 2

## 2019-11-05 MED ORDER — PROMETHAZINE HCL 25 MG/ML IJ SOLN: 6.2500 mg | INTRAMUSCULAR | Status: DC | PRN

## 2019-11-05 MED ORDER — CEFAZOLIN SODIUM-DEXTROSE 2-4 GM/100ML-% IV SOLN
2.0000 g | INTRAVENOUS | Status: AC
  Administered 2019-11-05: 09:00:00 2 g via INTRAVENOUS
  Filled 2019-11-05: qty 100

## 2019-11-05 MED ORDER — HYDROMORPHONE HCL 1 MG/ML IJ SOLN
0.2500 mg | INTRAMUSCULAR | Status: DC | PRN
  Administered 2019-11-05 (×2): 0.5 mg via INTRAVENOUS

## 2019-11-05 MED ORDER — CHLORHEXIDINE GLUCONATE CLOTH 2 % EX PADS: 6.0000 | MEDICATED_PAD | Freq: Once | CUTANEOUS | Status: DC

## 2019-11-05 MED ORDER — SCOPOLAMINE 1 MG/3DAYS TD PT72
1.0000 | MEDICATED_PATCH | TRANSDERMAL | Status: DC
  Administered 2019-11-05: 1.5 mg via TRANSDERMAL
  Filled 2019-11-05: qty 1

## 2019-11-05 MED ORDER — SUGAMMADEX SODIUM 200 MG/2ML IV SOLN
INTRAVENOUS | Status: DC | PRN
  Administered 2019-11-05: 200 mg via INTRAVENOUS

## 2019-11-05 MED ORDER — OXYCODONE-ACETAMINOPHEN 5-325 MG PO TABS: 1.0000 | ORAL_TABLET | Freq: Four times a day (QID) | ORAL | 0 refills | Status: DC | PRN

## 2019-11-05 MED ORDER — LIDOCAINE 2% (20 MG/ML) 5 ML SYRINGE
INTRAMUSCULAR | Status: AC
  Filled 2019-11-05: qty 5

## 2019-11-05 MED ORDER — CELECOXIB 200 MG PO CAPS
400.0000 mg | ORAL_CAPSULE | ORAL | Status: AC
  Administered 2019-11-05: 400 mg via ORAL
  Filled 2019-11-05: qty 2

## 2019-11-05 MED ORDER — IOHEXOL 300 MG/ML  SOLN
INTRAMUSCULAR | Status: DC | PRN
  Administered 2019-11-05: 9 mL

## 2019-11-05 MED ORDER — ROCURONIUM BROMIDE 10 MG/ML (PF) SYRINGE
PREFILLED_SYRINGE | INTRAVENOUS | Status: DC | PRN
  Administered 2019-11-05: 50 mg via INTRAVENOUS

## 2019-11-05 MED ORDER — MIDAZOLAM HCL 2 MG/2ML IJ SOLN
INTRAMUSCULAR | Status: AC
  Filled 2019-11-05: qty 2

## 2019-11-05 MED ORDER — MIDAZOLAM HCL 2 MG/2ML IJ SOLN
INTRAMUSCULAR | Status: DC | PRN
  Administered 2019-11-05: 2 mg via INTRAVENOUS

## 2019-11-05 MED ORDER — FENTANYL CITRATE (PF) 250 MCG/5ML IJ SOLN
INTRAMUSCULAR | Status: AC
  Filled 2019-11-05: qty 5

## 2019-11-05 MED ORDER — ONDANSETRON HCL 4 MG/2ML IJ SOLN
INTRAMUSCULAR | Status: AC
  Filled 2019-11-05: qty 2

## 2019-11-05 SURGICAL SUPPLY — 31 items
APPLIER CLIP 5 13 M/L LIGAMAX5 (MISCELLANEOUS) ×3
CABLE HIGH FREQUENCY MONO STRZ (ELECTRODE) ×3 IMPLANT
CHLORAPREP W/TINT 26 (MISCELLANEOUS) ×3 IMPLANT
CLIP APPLIE 5 13 M/L LIGAMAX5 (MISCELLANEOUS) ×1 IMPLANT
COVER MAYO STAND STRL (DRAPES) IMPLANT
COVER WAND RF STERILE (DRAPES) IMPLANT
DECANTER SPIKE VIAL GLASS SM (MISCELLANEOUS) ×3 IMPLANT
DERMABOND ADVANCED (GAUZE/BANDAGES/DRESSINGS) ×2
DERMABOND ADVANCED .7 DNX12 (GAUZE/BANDAGES/DRESSINGS) ×1 IMPLANT
DRAPE C-ARM 42X120 X-RAY (DRAPES) IMPLANT
ELECT REM PT RETURN 15FT ADLT (MISCELLANEOUS) ×3 IMPLANT
GLOVE SURG SIGNA 7.5 PF LTX (GLOVE) ×3 IMPLANT
GOWN STRL REUS W/TWL XL LVL3 (GOWN DISPOSABLE) ×6 IMPLANT
HEMOSTAT SNOW SURGICEL 2X4 (HEMOSTASIS) ×3 IMPLANT
HEMOSTAT SURGICEL 4X8 (HEMOSTASIS) IMPLANT
KIT BASIN (CUSTOM PROCEDURE TRAY) ×3 IMPLANT
KIT TURNOVER KIT A (KITS) IMPLANT
PENCIL SMOKE EVACUATOR (MISCELLANEOUS) IMPLANT
POUCH SPECIMEN RETRIEVAL 10MM (ENDOMECHANICALS) ×3 IMPLANT
SCISSORS LAP 5X35 DISP (ENDOMECHANICALS) ×3 IMPLANT
SET CHOLANGIOGRAPH MIX (MISCELLANEOUS) ×3 IMPLANT
SET IRRIG TUBING LAPAROSCOPIC (IRRIGATION / IRRIGATOR) ×3 IMPLANT
SET TUBE SMOKE EVAC HIGH FLOW (TUBING) ×3 IMPLANT
SLEEVE XCEL OPT CAN 5 100 (ENDOMECHANICALS) ×6 IMPLANT
SUT MNCRL AB 4-0 PS2 18 (SUTURE) ×3 IMPLANT
SUT VICRYL 0 UR6 27IN ABS (SUTURE) ×3 IMPLANT
TOWEL OR 17X26 10 PK STRL BLUE (TOWEL DISPOSABLE) ×3 IMPLANT
TOWEL OR NON WOVEN STRL DISP B (DISPOSABLE) ×3 IMPLANT
TRAY LAPAROSCOPIC (CUSTOM PROCEDURE TRAY) ×3 IMPLANT
TROCAR BLADELESS OPT 5 100 (ENDOMECHANICALS) ×3 IMPLANT
TROCAR XCEL BLUNT TIP 100MML (ENDOMECHANICALS) ×3 IMPLANT

## 2019-11-05 NOTE — Interval H&P Note (Signed)
History and Physical Interval Note: no change in H and P  11/05/2019 8:00 AM  Roberta Reynolds  has presented today for surgery, with the diagnosis of SYMPTOMATIC GALLSTONES.  The various methods of treatment have been discussed with the patient and family. After consideration of risks, benefits and other options for treatment, the patient has consented to  Procedure(s): LAPAROSCOPIC CHOLECYSTECTOMY WITH INTRAOPERATIVE CHOLANGIOGRAM (N/A) as a surgical intervention.  The patient's history has been reviewed, patient examined, no change in status, stable for surgery.  I have reviewed the patient's chart and labs.  Questions were answered to the patient's satisfaction.     [name]

## 2019-11-05 NOTE — Anesthesia Postprocedure Evaluation (Signed)
Anesthesia Post Note  Patient: Roberta Reynolds  Procedure(s) Performed: LAPAROSCOPIC CHOLECYSTECTOMY WITH INTRAOPERATIVE CHOLANGIOGRAM (N/A Abdomen)     Patient location during evaluation: PACU Anesthesia Type: General Level of consciousness: awake and alert Pain management: pain level controlled Vital Signs Assessment: post-procedure vital signs reviewed and stable Respiratory status: spontaneous breathing, nonlabored ventilation, respiratory function stable and patient connected to nasal cannula oxygen Cardiovascular status: blood pressure returned to baseline and stable Postop Assessment: no apparent nausea or vomiting Anesthetic complications: no    Last Vitals:  Vitals:   11/05/19 1036 11/05/19 1145  BP: 135/86 (!) 139/93  Pulse: 75 76  Resp: 18 16  Temp: 36.7 C 36.6 C  SpO2: 99% 95%    Last Pain:  Vitals:   11/05/19 1145  TempSrc:   PainSc: 4                  Roberta Reynolds

## 2019-11-05 NOTE — Discharge Instructions (Signed)
[facility] ______[facility] °LAPAROSCOPIC SURGERY: POST OP INSTRUCTIONS °Always review your discharge instruction sheet given to you by the facility where your surgery was performed. °IF YOU HAVE DISABILITY OR FAMILY LEAVE FORMS, YOU MUST BRING THEM TO THE OFFICE FOR PROCESSING.   °DO NOT GIVE THEM TO YOUR DOCTOR. ° °1. A prescription for pain medication may be given to you upon discharge.  Take your pain medication as prescribed, if needed.  If narcotic pain medicine is not needed, then you may take acetaminophen (Tylenol) or ibuprofen (Advil) as needed. °2. Take your usually prescribed medications unless otherwise directed. °3. If you need a refill on your pain medication, please contact your pharmacy.  They will contact our office to request authorization. Prescriptions will not be filled after 5pm or on week-ends. °4. You should follow a light diet the first few days after arrival home, such as soup and crackers, etc.  Be sure to include lots of fluids daily. °5. Most patients will experience some swelling and bruising in the area of the incisions.  Ice packs will help.  Swelling and bruising can take several days to resolve.  °6. It is common to experience some constipation if taking pain medication after surgery.  Increasing fluid intake and taking a stool softener (such as Colace) will usually help or prevent this problem from occurring.  A mild laxative (Milk of Magnesia or Miralax) should be taken according to package instructions if there are no bowel movements after 48 hours. °7. Unless discharge instructions indicate otherwise, you may remove your bandages 24-48 hours after surgery, and you may shower at that time.  You may have steri-strips (small skin tapes) in place directly over the incision.  These strips should be left on the skin for 7-10 days.  If your surgeon used skin glue on the incision, you may shower in 24 hours.  The glue will flake off over the next 2-3 weeks.  Any sutures or  staples will be removed at the office during your follow-up visit. °8. ACTIVITIES:  You may resume regular (light) daily activities beginning the next day--such as daily self-care, walking, climbing stairs--gradually increasing activities as tolerated.  You may have sexual intercourse when it is comfortable.  Refrain from any heavy lifting or straining until approved by your doctor. °a. You may drive when you are no longer taking prescription pain medication, you can comfortably wear a seatbelt, and you can safely maneuver your car and apply brakes. °b. RETURN TO WORK:  __________________________________________________________ °9. You should see your doctor in the office for a follow-up appointment approximately 2-3 weeks after your surgery.  Make sure that you call for this appointment within a day or two after you arrive home to insure a convenient appointment time. °10. OTHER INSTRUCTIONS:OK TO SHOWER STARTING TOMORROW °11. ICE PACK, TYLENOL, AND IBUPROFEN ALSO FOR PAIN °12. NO LIFTING MORE THAN 15 TO 20 POUNDS FOR 2 WEEKS __________________________________________________________________________________________________________________________ __________________________________________________________________________________________________________________________ °WHEN TO CALL YOUR DOCTOR: °1. Fever over 101.0 °2. Inability to urinate °3. Continued bleeding from incision. °4. Increased pain, redness, or drainage from the incision. °5. Increasing abdominal pain ° °The clinic staff is available to answer your questions during regular business hours.  Please don’t hesitate to call and ask to speak to one of the nurses for clinical concerns.  If you have a medical emergency, go to the nearest emergency room or call 911.  A surgeon from [facility] is always on call at the hospital. °[address],   [address], [place], [place]  [address] ? [address] [address], [place], [place]   [address] °[phone] ?  [phone] ? FAX [phone] °Web site: [email or web address] °

## 2019-11-05 NOTE — Op Note (Signed)
Laparoscopic Cholecystectomy with IOC Procedure Note  Indications: This patient presents with symptomatic gallbladder disease and will undergo laparoscopic cholecystectomy.  Pre-operative Diagnosis: symptomatic cholelithiasis  Post-operative Diagnosis: Same  Surgeon: [name]   Assistants: 0  Anesthesia: General endotracheal anesthesia  ASA Class: 1  Procedure Details  The patient was seen again in the Holding Room. The risks, benefits, complications, treatment options, and expected outcomes were discussed with the patient. The possibilities of reaction to medication, pulmonary aspiration, perforation of viscus, bleeding, recurrent infection, finding a normal gallbladder, the need for additional procedures, failure to diagnose a condition, the possible need to convert to an open procedure, and creating a complication requiring transfusion or operation were discussed with the patient. The likelihood of improving the patient's symptoms with return to their baseline status is good.  The patient and/or family concurred with the proposed plan, giving informed consent. The site of surgery properly noted. The patient was taken to Operating Room, identified as Roberta Reynolds and the procedure verified as Laparoscopic Cholecystectomy with Intraoperative Cholangiogram. A Time Out was held and the above information confirmed.  Prior to the induction of general anesthesia, antibiotic prophylaxis was administered. General endotracheal anesthesia was then administered and tolerated well. After the induction, the abdomen was prepped with Chloraprep and draped in the sterile fashion. The patient was positioned in the supine position.  Local anesthetic agent was injected into the skin near the umbilicus and an incision made. We dissected down to the abdominal fascia with blunt dissection.  The fascia was incised vertically and we entered the peritoneal cavity bluntly.  A pursestring suture of 0-Vicryl  was placed around the fascial opening.  The Hasson cannula was inserted and secured with the stay suture.  Pneumoperitoneum was then created with CO2 and tolerated well without any adverse changes in the patient's vital signs. A 5-mm port was placed in the subxiphoid position.  Two 5-mm ports were placed in the right upper quadrant. All skin incisions were infiltrated with a local anesthetic agent before making the incision and placing the trocars.   We positioned the patient in reverse Trendelenburg, tilted slightly to the patient's left.  The gallbladder was identified, the fundus grasped and retracted cephalad. Adhesions were lysed bluntly and with the electrocautery where indicated, taking care not to injure any adjacent organs or viscus.  The patient had a very large, chronically inflamed gallbladder with a large stone in the neck of the gallbladder.  The infundibulum was grasped and retracted laterally, exposing the peritoneum overlying the triangle of Calot. This was then divided and exposed in a blunt fashion. A critical view of the cystic duct and cystic artery was obtained.  The cystic duct was clearly identified and bluntly dissected circumferentially. The cystic duct was ligated with a clip distally.   An incision was made in the cystic duct and the [facility] cholangiogram catheter introduced. The catheter was secured using a clip. A cholangiogram was then obtained which showed good visualization of the distal tree with no sign of filling defects or obstruction.  I was clearly at the cystic duct and the cholangiocatheter went down toward the bile duct and I cannot get contrast to go back into the proximal bile ducts.  Contrast flowed easily into the duodenum. The catheter was then removed.  Again was certain this was the cystic duct.  The cystic duct was then ligated with clips and divided. The cystic artery was identified, dissected free, ligated with clips and divided as well.   The  gallbladder was  dissected from the liver bed in retrograde fashion with the electrocautery. The gallbladder was removed and placed in an Endocatch sac. The liver bed was irrigated and inspected. Hemostasis was achieved with the electrocautery. Copious irrigation was utilized and was repeatedly aspirated until clear.  The gallbladder and Endocatch sac were then removed through the umbilical port site.  The pursestring suture was used to close the umbilical fascia.  I had to place an extra suture at the fascial closure as well.  We again inspected the right upper quadrant for hemostasis.  Pneumoperitoneum was released as we removed the trocars.  4-0 Monocryl was used to close the skin.  Skin glue was then applied. The patient was then extubated and brought to the recovery room in stable condition. Instrument, sponge, and needle counts were correct at closure and at the conclusion of the case.   Findings: Chronic Cholecystitis with large stones including one in the neck of the gallbladder.  Estimated Blood Loss: Minimal                 Specimens: Gallbladder           Complications: None; patient tolerated the procedure well.         Disposition: PACU - hemodynamically stable.         Condition: stable

## 2019-11-05 NOTE — Anesthesia Procedure Notes (Signed)
Procedure Name: Intubation Date/Time: 11/05/2019 8:30 AM Performed by: Florene Route, CRNA Patient Re-evaluated:Patient Re-evaluated prior to induction Oxygen Delivery Method: Circle system utilized Preoxygenation: Pre-oxygenation with 100% oxygen Induction Type: IV induction Ventilation: Mask ventilation without difficulty and Oral airway inserted - appropriate to patient size Laryngoscope Size: Hyacinth Meeker and 2 Grade View: Grade I Tube type: Oral Tube size: 7.5 mm Number of attempts: 1 Airway Equipment and Method: Stylet Placement Confirmation: ETT inserted through vocal cords under direct vision,  positive ETCO2 and breath sounds checked- equal and bilateral Secured at: 21 cm Tube secured with: Tape Dental Injury: Teeth and Oropharynx as per pre-operative assessment

## 2019-11-05 NOTE — Transfer of Care (Signed)
Immediate Anesthesia Transfer of Care Note  Patient: Roberta Reynolds  Procedure(s) Performed: LAPAROSCOPIC CHOLECYSTECTOMY WITH INTRAOPERATIVE CHOLANGIOGRAM (N/A Abdomen)  Patient Location: PACU  Anesthesia Type:General  Level of Consciousness: drowsy  Airway & Oxygen Therapy: Patient Spontanous Breathing and Patient connected to face mask oxygen  Post-op Assessment: Report given to RN and Post -op Vital signs reviewed and stable  Post vital signs: Reviewed and stable  Last Vitals:  Vitals Value Taken Time  BP 136/90 11/05/19 0930  Temp    Pulse 97 11/05/19 0930  Resp 10 11/05/19 0930  SpO2 100 % 11/05/19 0930  Vitals shown include unvalidated device data.  Last Pain:  Vitals:   11/05/19 0723  TempSrc:   PainSc: 0-No pain         Complications: No apparent anesthesia complications

## 2019-11-06 LAB — SURGICAL PATHOLOGY

## 2019-11-26 ENCOUNTER — Other Ambulatory Visit: Payer: Self-pay

## 2019-11-26 ENCOUNTER — Ambulatory Visit: Payer: Medicaid Other | Admitting: Nurse Practitioner

## 2019-12-15 ENCOUNTER — Ambulatory Visit (INDEPENDENT_AMBULATORY_CARE_PROVIDER_SITE_OTHER): Payer: Medicaid Other | Admitting: Obstetrics & Gynecology

## 2019-12-15 ENCOUNTER — Other Ambulatory Visit: Payer: Self-pay

## 2019-12-15 ENCOUNTER — Encounter: Payer: Self-pay | Admitting: Obstetrics & Gynecology

## 2019-12-15 VITALS — BP 115/73 | HR 79 | Wt 187.4 lb

## 2019-12-15 DIAGNOSIS — R6882 Decreased libido: Secondary | ICD-10-CM

## 2019-12-15 DIAGNOSIS — Z3041 Encounter for surveillance of contraceptive pills: Secondary | ICD-10-CM

## 2019-12-15 LAB — POCT URINE PREGNANCY: Preg Test, Ur: NEGATIVE

## 2019-12-15 MED ORDER — NORETHINDRONE 0.35 MG PO TABS: 1.0000 | ORAL_TABLET | Freq: Every day | ORAL | 3 refills | Status: DC

## 2019-12-15 NOTE — Progress Notes (Signed)
GYNECOLOGY OFFICE VISIT NOTE  History:   Roberta Reynolds is a 39 y.o. [id number] here today for discussion about OCP effects.  Has been on Sprintec, this caused low libido and she finally stopped it a month ago.  Libido has returned, but she desires contraception.  Condoms are not working for her and her husband. Has used IUD in the past, had extreme pain.  Depo Provera caused immense weight gain. Not enthusiastic about using other OCPs.  She denies any abnormal vaginal discharge, bleeding, pelvic pain or other concerns.    Past Medical History:  Diagnosis Date  . Complication of anesthesia    spinal heachache wants general anesthesia for CS  . Depression   . Dyspnea   . GERD (gastroesophageal reflux disease)   . Spinal headache    for four days    Past Surgical History:  Procedure Laterality Date  . CESAREAN SECTION    . CESAREAN SECTION N/A 06/18/2018   Procedure: CESAREAN SECTION;  Surgeon: Tilleda Bing, MD;  Location: [facility] BIRTHING SUITES;  Service: Obstetrics;  Laterality: N/A;  . CHOLECYSTECTOMY N/A 11/05/2019   Procedure: LAPAROSCOPIC CHOLECYSTECTOMY WITH INTRAOPERATIVE CHOLANGIOGRAM;  Surgeon: [name], MD;  Location: [facility] ORS;  Service: General;  Laterality: N/A;    The following portions of the patient's history were reviewed and updated as appropriate: allergies, current medications, past family history, past medical history, past social history, past surgical history and problem list.   Health Maintenance:  Normal pap and negative HRHPV on 12/11/2017.    Review of Systems:  Pertinent items noted in HPI and remainder of comprehensive ROS otherwise negative.  Physical Exam:  BP 115/73   Pulse 79   Wt 187 lb 6.4 oz (85 kg)   LMP 11/14/2019   BMI 32.17 kg/m  CONSTITUTIONAL: Well-developed, well-nourished female in no acute distress.  HEENT:  Normocephalic, atraumatic. External right and left ear normal. No scleral icterus.  NECK: Normal range of motion, supple,  no masses noted on observation SKIN: No rash noted. Not diaphoretic. No erythema. No pallor. MUSCULOSKELETAL: Normal range of motion. No edema noted. NEUROLOGIC: Alert and oriented to person, place, and time. Normal muscle tone coordination. No cranial nerve deficit noted. PSYCHIATRIC: Normal mood and affect. Normal behavior. Normal judgment and thought content. CARDIOVASCULAR: Normal heart rate noted RESPIRATORY: Effort and breath sounds normal, no problems with respiration noted ABDOMEN: No masses noted. No other overt distention noted.   PELVIC: Deferred  Labs and Imaging Results for orders placed or performed in visit on 12/15/19 (from the past 168 hour(s))  POCT urine pregnancy   Collection Time: 12/15/19  3:36 PM  Result Value Ref Range   Preg Test, Ur Negative Negative   No results found.    Assessment and Plan:     1. Decreased libido 2. Oral contraceptive pill surveillance Discussed that combined OCPs (estrogen-progesterone) can have anti-androgenic or anti-testosterone effects and can affect libido. Reassured the libido has rebounded after she stopped Sprintec, she declines nonhormonal or hormonal IUDs or any LARCs.  Offered lowest dose estrogen pills (Lo Loestrin) vs progesterone only (Micronor) pills.  Emphasized that POPs are not as effective, and have to be taken at the same time every day to prevent breakthrough bleeding. If this occurs, will consider Lo Loestrin instead.  - norethindrone (MICRONOR) 0.35 MG tablet; Take 1 tablet (0.35 mg total) by mouth daily.  Dispense: 30 tablet; Refill: 3  Routine preventative health maintenance measures emphasized. Please refer to After Visit Summary for other counseling  recommendations.   Return in about 2 months (around 02/14/2020) for Followup.    Total face-to-face time with patient: 20 minutes.  Over 50% of encounter was spent on counseling and coordination of care.   Verita Schneiders, MD, [place] for [employer], [place]

## 2020-01-12 ENCOUNTER — Encounter (INDEPENDENT_AMBULATORY_CARE_PROVIDER_SITE_OTHER): Payer: Self-pay

## 2020-01-20 ENCOUNTER — Encounter: Payer: Self-pay | Admitting: Nurse Practitioner

## 2020-01-20 ENCOUNTER — Other Ambulatory Visit: Payer: Self-pay

## 2020-01-20 ENCOUNTER — Ambulatory Visit: Payer: Medicaid Other | Attending: Nurse Practitioner | Admitting: Nurse Practitioner

## 2020-01-20 VITALS — Ht 65.0 in | Wt 183.0 lb

## 2020-01-20 DIAGNOSIS — Z7689 Persons encountering health services in other specified circumstances: Secondary | ICD-10-CM

## 2020-01-20 DIAGNOSIS — N938 Other specified abnormal uterine and vaginal bleeding: Secondary | ICD-10-CM

## 2020-01-20 NOTE — Progress Notes (Signed)
Virtual Visit via Telephone Note Due to national recommendations of social distancing due to COVID 19, telehealth visit is felt to be most appropriate for this patient at this time.  I discussed the limitations, risks, security and privacy concerns of performing an evaluation and management service by telephone and the availability of in person appointments. I also discussed with the patient that there may be a patient responsible charge related to this service. The patient expressed understanding and agreed to proceed.    I connected with Roberta Reynolds on 01/20/20  at   8:50 AM EDT  EDT by telephone and verified that I am speaking with the correct person using two identifiers.   Consent I discussed the limitations, risks, security and privacy concerns of performing an evaluation and management service by telephone and the availability of in person appointments. I also discussed with the patient that there may be a patient responsible charge related to this service. The patient expressed understanding and agreed to proceed.   Location of Patient: Private Residence    Location of Provider: [facility]    Persons participating in Telemedicine visit: Bertram Denver FNP-BC YY Bien CMA Fidencia Skeens    History of Present Illness: Telemedicine visit for: Establish Care PMH: Depression, Dyspnea, GERD (gastroesophageal reflux disease), and Gestational Diabetes.  Notes irregular menstrual cycles since starting OCPs recently. Denies any abdominal or pelvic pain. She was recently (6-21) evaluated by GYN with complaints of low libido. She had stopped taking sprintec due to this. Previous contraceptives tried: condoms, IUD, Depo. She was started on micronor several weeks ago and reports breakthrough bleeding since starting it. She is to return to GYN in 2 months. I have instructed her that she should remember to try to take her pill at the same time every day.   Past  Medical History:  Diagnosis Date  . Complication of anesthesia    spinal heachache wants general anesthesia for CS  . Depression   . Dyspnea   . GERD (gastroesophageal reflux disease)   . Spinal headache    for four days    Past Surgical History:  Procedure Laterality Date  . CESAREAN SECTION    . CESAREAN SECTION N/A 06/18/2018   Procedure: CESAREAN SECTION;  Surgeon: Royalton Bing, MD;  Location: [facility] BIRTHING SUITES;  Service: Obstetrics;  Laterality: N/A;  . CESAREAN SECTION    . CHOLECYSTECTOMY N/A 11/05/2019   Procedure: LAPAROSCOPIC CHOLECYSTECTOMY WITH INTRAOPERATIVE CHOLANGIOGRAM;  Surgeon: [name], MD;  Location: [facility];  Service: General;  Laterality: N/A;    Family History  Problem Relation Age of Onset  . Cancer Maternal Aunt   . Cancer Paternal Uncle   . Hypertension Father     Social History   Socioeconomic History  . Marital status: Married    Spouse name: Not on file  . Number of children: 3  . Years of education: 71  . Highest education level: Not on file  Occupational History  . Not on file  Tobacco Use  . Smoking status: Never Smoker  . Smokeless tobacco: Never Used  Vaping Use  . Vaping Use: Never used  Substance and Sexual Activity  . Alcohol use: No  . Drug use: No  . Sexual activity: Yes    Partners: Male    Birth control/protection: None  Other Topics Concern  . Not on file  Social History Narrative  . Not on file   Social Determinants of Health   Financial Resource Strain:   .  Difficulty of Paying Living Expenses:   Food Insecurity:   . Worried About Programme researcher, broadcasting/film/video in the Last Year:   . Barista in the Last Year:   Transportation Needs:   . Freight forwarder (Medical):   Marland Kitchen Lack of Transportation (Non-Medical):   Physical Activity:   . Days of Exercise per Week:   . Minutes of Exercise per Session:   Stress:   . Feeling of Stress :   Social Connections:   . Frequency of Communication with Friends  and Family:   . Frequency of Social Gatherings with Friends and Family:   . Attends Religious Services:   . Active Member of Clubs or Organizations:   . Attends Banker Meetings:   Marland Kitchen Marital Status:      Observations/Objective: Awake, alert and oriented x 3   Review of Systems  Constitutional: Negative for fever, malaise/fatigue and weight loss.  HENT: Negative.  Negative for nosebleeds.   Eyes: Negative.  Negative for blurred vision, double vision and photophobia.  Respiratory: Negative.  Negative for cough and shortness of breath.   Cardiovascular: Negative.  Negative for chest pain, palpitations and leg swelling.  Gastrointestinal: Negative.  Negative for heartburn, nausea and vomiting.  Genitourinary:       SEE HPI  Musculoskeletal: Negative.  Negative for myalgias.  Neurological: Negative.  Negative for dizziness, focal weakness, seizures and headaches.  Psychiatric/Behavioral: Negative.  Negative for suicidal ideas.    Assessment and Plan: Roberta Reynolds was seen today for new patient (initial visit).  Diagnoses and all orders for this visit:  Encounter to establish care  DUB (dysfunctional uterine bleeding) Continue Micronor as prsecribed   Follow Up Instructions Return if symptoms worsen or fail to improve.     I discussed the assessment and treatment plan with the patient. The patient was provided an opportunity to ask questions and all were answered. The patient agreed with the plan and demonstrated an understanding of the instructions.   The patient was advised to call back or seek an in-person evaluation if the symptoms worsen or if the condition fails to improve as anticipated.  I provided 14 minutes of non-face-to-face time during this encounter including median intraservice time, reviewing previous notes, labs, imaging, medications and explaining diagnosis and management.  Claiborne Rigg, FNP-BC

## 2020-01-22 ENCOUNTER — Other Ambulatory Visit: Payer: Self-pay | Admitting: Nurse Practitioner

## 2020-01-22 DIAGNOSIS — Z1321 Encounter for screening for nutritional disorder: Secondary | ICD-10-CM

## 2020-01-22 DIAGNOSIS — Z131 Encounter for screening for diabetes mellitus: Secondary | ICD-10-CM

## 2020-01-22 DIAGNOSIS — Z1329 Encounter for screening for other suspected endocrine disorder: Secondary | ICD-10-CM

## 2020-01-22 DIAGNOSIS — Z13220 Encounter for screening for lipoid disorders: Secondary | ICD-10-CM

## 2020-01-22 DIAGNOSIS — Z130 Encounter for screening for diseases of the blood and blood-forming organs and certain disorders involving the immune mechanism: Secondary | ICD-10-CM

## 2020-01-22 DIAGNOSIS — Z13228 Encounter for screening for other metabolic disorders: Secondary | ICD-10-CM

## 2020-01-23 ENCOUNTER — Ambulatory Visit: Payer: Medicaid Other | Attending: Nurse Practitioner

## 2020-01-23 ENCOUNTER — Other Ambulatory Visit: Payer: Self-pay

## 2020-01-23 DIAGNOSIS — Z131 Encounter for screening for diabetes mellitus: Secondary | ICD-10-CM

## 2020-01-23 DIAGNOSIS — Z1321 Encounter for screening for nutritional disorder: Secondary | ICD-10-CM

## 2020-01-23 DIAGNOSIS — Z13228 Encounter for screening for other metabolic disorders: Secondary | ICD-10-CM

## 2020-01-23 DIAGNOSIS — Z13220 Encounter for screening for lipoid disorders: Secondary | ICD-10-CM

## 2020-01-23 DIAGNOSIS — Z130 Encounter for screening for diseases of the blood and blood-forming organs and certain disorders involving the immune mechanism: Secondary | ICD-10-CM

## 2020-01-24 ENCOUNTER — Encounter: Payer: Self-pay | Admitting: Nurse Practitioner

## 2020-01-24 LAB — LIPID PANEL
Chol/HDL Ratio: 4.4 ratio (ref 0.0–4.4)
Cholesterol, Total: 218 mg/dL — ABNORMAL HIGH (ref 100–199)
HDL: 50 mg/dL (ref >39)
LDL Chol Calc (NIH): 140 mg/dL — ABNORMAL HIGH (ref 0–99)
Triglycerides: 156 mg/dL — ABNORMAL HIGH (ref 0–149)
VLDL Cholesterol Cal: 28 mg/dL (ref 5–40)

## 2020-01-24 LAB — CBC
Hematocrit: 38 % (ref 34.0–46.6)
Hemoglobin: 12 g/dL (ref 11.1–15.9)
MCH: 23.6 pg — ABNORMAL LOW (ref 26.6–33.0)
MCHC: 31.6 g/dL (ref 31.5–35.7)
MCV: 75 fL — ABNORMAL LOW (ref 79–97)
Platelets: 291 10*3/uL (ref 150–450)
RBC: 5.09 x10E6/uL (ref 3.77–5.28)
RDW: 15.3 % (ref 11.7–15.4)
WBC: 7 10*3/uL (ref 3.4–10.8)

## 2020-01-24 LAB — BASIC METABOLIC PANEL
BUN/Creatinine Ratio: 17 (ref 9–23)
BUN: 11 mg/dL (ref 6–24)
CO2: 21 mmol/L (ref 20–29)
Calcium: 9.5 mg/dL (ref 8.7–10.2)
Chloride: 106 mmol/L (ref 96–106)
Creatinine, Ser: 0.64 mg/dL (ref 0.57–1.00)
GFR calc Af Amer: 128 mL/min/{1.73_m2} (ref >59)
GFR calc non Af Amer: 111 mL/min/{1.73_m2} (ref >59)
Glucose: 99 mg/dL (ref 65–99)
Potassium: 4.7 mmol/L (ref 3.5–5.2)
Sodium: 141 mmol/L (ref 134–144)

## 2020-01-24 LAB — HEMOGLOBIN A1C
Est. average glucose Bld gHb Est-mCnc: 111 mg/dL
Hgb A1c MFr Bld: 5.5 % (ref 4.8–5.6)

## 2020-01-24 LAB — VITAMIN D 25 HYDROXY (VIT D DEFICIENCY, FRACTURES): Vit D, 25-Hydroxy: 23.4 ng/mL — ABNORMAL LOW (ref 30.0–100.0)

## 2020-02-04 ENCOUNTER — Encounter: Payer: Self-pay | Admitting: Nurse Practitioner

## 2020-02-04 ENCOUNTER — Other Ambulatory Visit: Payer: Self-pay

## 2020-02-04 ENCOUNTER — Ambulatory Visit: Payer: Medicaid Other | Attending: Nurse Practitioner | Admitting: Nurse Practitioner

## 2020-02-04 VITALS — BP 96/61 | HR 81 | Temp 97.7°F | Ht 65.0 in | Wt 182.0 lb

## 2020-02-04 DIAGNOSIS — Z0000 Encounter for general adult medical examination without abnormal findings: Secondary | ICD-10-CM

## 2020-02-04 DIAGNOSIS — Z1231 Encounter for screening mammogram for malignant neoplasm of breast: Secondary | ICD-10-CM

## 2020-02-04 DIAGNOSIS — L600 Ingrowing nail: Secondary | ICD-10-CM

## 2020-02-04 NOTE — Progress Notes (Signed)
Assessment & Plan:  Roberta Reynolds was seen today for annual exam.  Diagnoses and all orders for this visit:  Encounter for annual physical exam  Breast cancer screening by mammogram -     MM 3D SCREEN BREAST BILATERAL; Future  Ingrown toenail of left foot -     Ambulatory referral to Podiatry    Patient has been counseled on age-appropriate routine health concerns for screening and prevention. These are reviewed and up-to-date. Referrals have been placed accordingly. Immunizations are up-to-date or declined.    Subjective:   Chief Complaint  Patient presents with  . Annual Exam    Pt. is here for a physical exam.    HPI Roberta Reynolds 39 y.o. female presents to office today for annual physical. She also has recurrent ingrown toenail pain and needs to be seen by a podiatrist. She saw Dr. Lajoyce Reynolds last year for this and lateral border of left toenail was removed however she does not wish to return to Dr. Lajoyce Reynolds for this at this time.    Review of Systems  Constitutional: Negative for fever, malaise/fatigue and weight loss.  HENT: Negative.  Negative for nosebleeds.   Eyes: Negative.  Negative for blurred vision, double vision and photophobia.  Respiratory: Negative.  Negative for cough and shortness of breath.   Cardiovascular: Negative.  Negative for chest pain, palpitations and leg swelling.  Gastrointestinal: Negative.  Negative for heartburn, nausea and vomiting.  Genitourinary: Negative.   Musculoskeletal: Negative.  Negative for myalgias.  Skin: Negative.   Neurological: Negative.  Negative for dizziness, focal weakness, seizures and headaches.  Endo/Heme/Allergies: Negative.   Psychiatric/Behavioral: Negative.  Negative for suicidal ideas.    Past Medical History:  Diagnosis Date  . Complication of anesthesia    spinal heachache wants general anesthesia for CS  . Depression   . Dyspnea   . GERD (gastroesophageal reflux disease)   . Spinal headache    for four days     Past Surgical History:  Procedure Laterality Date  . CESAREAN SECTION    . CESAREAN SECTION N/A 06/18/2018   Procedure: CESAREAN SECTION;  Surgeon: Roberta Bing, MD;  Location: [facility] BIRTHING SUITES;  Service: Obstetrics;  Laterality: N/A;  . CESAREAN SECTION    . CHOLECYSTECTOMY N/A 11/05/2019   Procedure: LAPAROSCOPIC CHOLECYSTECTOMY WITH INTRAOPERATIVE CHOLANGIOGRAM;  Surgeon: Roberta Miyamoto, MD;  Location: [facility] ORS;  Service: General;  Laterality: N/A;    Family History  Problem Relation Age of Onset  . Cancer Maternal Aunt   . Cancer Paternal Uncle   . Hypertension Father     Social History Reviewed with no changes to be made today.   Outpatient Medications Prior to Visit  Medication Sig Dispense Refill  . norethindrone (MICRONOR) 0.35 MG tablet Take 1 tablet (0.35 mg total) by mouth daily. 30 tablet 3   No facility-administered medications prior to visit.    Allergies  Allergen Reactions  . Other Other (See Comments)    Continuous Epidural Tray- caused a "Spinal headache" and "I could not stand"   . Chalk Itching  . Sprintec 28 [Norgestimate-Eth Estradiol] Other (See Comments)    Affected the libido negatively (low sexual drive)       Objective:    BP 96/61 (BP Location: Left Arm, Patient Position: Sitting, Cuff Size: Normal)   Pulse 81   Temp 97.7 F (36.5 C) (Temporal)   Ht 5\' 5"  (1.651 m)   Wt 182 lb (82.6 kg)   SpO2 98%   BMI 30.29  kg/m  Wt Readings from Last 3 Encounters:  02/04/20 182 lb (82.6 kg)  01/20/20 182 lb 15.7 oz (83 kg)  12/15/19 187 lb 6.4 oz (85 kg)    Physical Exam Constitutional:      Appearance: She is well-developed.  HENT:     Head: Normocephalic and atraumatic.     Right Ear: External ear normal.     Left Ear: External ear normal.     Nose: Nose normal.     Mouth/Throat:     Pharynx: No oropharyngeal exudate.  Eyes:     General: No scleral icterus.       Right eye: No discharge.     Conjunctiva/sclera:  Conjunctivae normal.     Pupils: Pupils are equal, round, and reactive to light.  Neck:     Thyroid: No thyromegaly.     Trachea: No tracheal deviation.  Cardiovascular:     Rate and Rhythm: Normal rate and regular rhythm.     Heart sounds: Normal heart sounds. No murmur heard.  No friction rub.  Pulmonary:     Effort: Pulmonary effort is normal. No accessory muscle usage or respiratory distress.     Breath sounds: Normal breath sounds. No decreased breath sounds, wheezing, rhonchi or rales.  Chest:     Chest wall: No tenderness.     Breasts: Breasts are symmetrical.        Right: No inverted nipple, mass, nipple discharge, skin change or tenderness.        Left: No inverted nipple, mass, nipple discharge, skin change or tenderness.  Abdominal:     General: Bowel sounds are normal. There is no distension.     Palpations: Abdomen is soft. There is no mass.     Tenderness: There is no abdominal tenderness. There is no guarding or rebound.  Musculoskeletal:        General: No tenderness or deformity. Normal range of motion.     Cervical back: Normal range of motion and neck supple.       Feet:  Feet:     Left foot:     Toenail Condition: Left toenails are ingrown.  Lymphadenopathy:     Cervical: No cervical adenopathy.  Skin:    General: Skin is warm and dry.     Findings: No erythema.  Neurological:     Mental Status: She is alert and oriented to person, place, and time.     Cranial Nerves: No cranial nerve deficit.     Coordination: Coordination normal.     Deep Tendon Reflexes: Reflexes are normal and symmetric.  Psychiatric:        Speech: Speech normal.        Behavior: Behavior normal.        Thought Content: Thought content normal.        Judgment: Judgment normal.          Patient has been counseled extensively about nutrition and exercise as well as the importance of adherence with medications and regular follow-up. The patient was given clear instructions to  go to ER or return to medical center if symptoms don't improve, worsen or new problems develop. The patient verbalized understanding.   Follow-up: Return if symptoms worsen or fail to improve.   Claiborne Rigg, FNP-BC [facility] [place], [place] [phone]   02/04/2020, 1:08 PM

## 2020-02-16 ENCOUNTER — Encounter: Payer: Self-pay | Admitting: Podiatry

## 2020-02-16 ENCOUNTER — Other Ambulatory Visit: Payer: Self-pay

## 2020-02-16 ENCOUNTER — Ambulatory Visit: Payer: Medicaid Other | Admitting: Podiatry

## 2020-02-16 DIAGNOSIS — L600 Ingrowing nail: Secondary | ICD-10-CM

## 2020-02-16 NOTE — Patient Instructions (Signed)

## 2020-02-16 NOTE — Progress Notes (Signed)
Subjective:   Patient ID: Roberta Reynolds, female   DOB: 39 y.o.   MRN: [id number]   HPI Patient presents stating I have an ingrown toenail my left big toe that is been very tender and I had it worked on once before and it did not solve my problem.  Patient states it is been present for at least a year and she is tried to trim it and soak it and patient does not smoke likes to be active   Review of Systems  All other systems reviewed and are negative.       Objective:  Physical Exam Vitals and nursing note reviewed.  Constitutional:      Appearance: She is well-developed.  Pulmonary:     Effort: Pulmonary effort is normal.  Musculoskeletal:        General: Normal range of motion.  Skin:    General: Skin is warm.  Neurological:     Mental Status: She is alert.     Neurovascular status intact muscle strength found to be adequate range of motion within normal limits.  Patient's left hallux lateral border is incurvated and painful and she cannot wear shoe gear comfortably and has tried wider shoes she is tried trimming it without relief with no drainage noted     Assessment:  Ingrown toenail deformity left hallux lateral border with pain     Plan:  H&P reviewed condition recommended correction.  I explained procedure risk and she wants surgery signed consent form and today I infiltrated the left hallux 60 mg like Marcaine mixture sterile prep done and using sterile instrumentation I remove the lateral border exposed matrix applied phenol 3 applications 30 seconds followed by alcohol lavage sterile dressing and gave instructions for soaks and to leave dressing on 24 hours but take it off earlier if any throbbing were to occur.  Encouraged her to call with questions concerns

## 2020-02-20 ENCOUNTER — Ambulatory Visit: Payer: Medicaid Other

## 2020-02-23 ENCOUNTER — Other Ambulatory Visit: Payer: Self-pay

## 2020-02-23 ENCOUNTER — Ambulatory Visit (INDEPENDENT_AMBULATORY_CARE_PROVIDER_SITE_OTHER): Payer: Medicaid Other | Admitting: Obstetrics & Gynecology

## 2020-02-23 ENCOUNTER — Encounter: Payer: Self-pay | Admitting: Obstetrics & Gynecology

## 2020-02-23 VITALS — BP 107/72 | HR 72 | Wt 182.7 lb

## 2020-02-23 DIAGNOSIS — Z3041 Encounter for surveillance of contraceptive pills: Secondary | ICD-10-CM | POA: Diagnosis not present

## 2020-02-23 DIAGNOSIS — R6882 Decreased libido: Secondary | ICD-10-CM | POA: Diagnosis not present

## 2020-02-23 MED ORDER — NORETHINDRONE 0.35 MG PO TABS: 1.0000 | ORAL_TABLET | Freq: Every day | ORAL | 4 refills | Status: DC

## 2020-02-23 NOTE — Patient Instructions (Signed)
Return to clinic for any scheduled appointments or for any gynecologic concerns as needed.   

## 2020-02-23 NOTE — Progress Notes (Signed)
   GYNECOLOGY OFFICE VISIT NOTE  History:   Roberta Reynolds is a 39 y.o. [id number] here today for follow up after OCP formulation change due to side effect of decreased libido. Was on Sprintec, and libido decreased. Switched to [employer], she feels it is much better.  Reports other social issues with her husband that she is trying to solve; feels is stems from their culture. Did not mention any physical/emotional abuse, just that she feels tired with all she has to do at home and feels unappreciated. She denies any abnormal vaginal discharge, bleeding, pelvic pain or other concerns.    Past Medical History:  Diagnosis Date  . Complication of anesthesia    spinal heachache wants general anesthesia for CS  . Depression   . Dyspnea   . GERD (gastroesophageal reflux disease)   . Spinal headache    for four days    Past Surgical History:  Procedure Laterality Date  . CESAREAN SECTION    . CESAREAN SECTION N/A 06/18/2018   Procedure: CESAREAN SECTION;  Surgeon: [place] Bing, MD;  Location: [facility] BIRTHING SUITES;  Service: Obstetrics;  Laterality: N/A;  . CESAREAN SECTION    . CHOLECYSTECTOMY N/A 11/05/2019   Procedure: LAPAROSCOPIC CHOLECYSTECTOMY WITH INTRAOPERATIVE CHOLANGIOGRAM;  Surgeon: [name], MD;  Location: [facility] ORS;  Service: General;  Laterality: N/A;    The following portions of the patient's history were reviewed and updated as appropriate: allergies, current medications, past family history, past medical history, past social history, past surgical history and problem list.   Health Maintenance:  Normal pap and negative HRHPV on 12/11/2017.  Normal mammogram on 02/27/2020.   Review of Systems:  Pertinent items noted in HPI and remainder of comprehensive ROS otherwise negative.  Physical Exam:  BP 107/72   Pulse 72   Wt 182 lb 11.2 oz (82.9 kg)   LMP 02/23/2020   BMI 30.40 kg/m  CONSTITUTIONAL: Well-developed, well-nourished female in no acute distress.  HEENT:   Normocephalic, atraumatic. External right and left ear normal. No scleral icterus.  NECK: Normal range of motion, supple, no masses noted on observation SKIN: No rash noted. Not diaphoretic. No erythema. No pallor. MUSCULOSKELETAL: Normal range of motion. No edema noted. NEUROLOGIC: Alert and oriented to person, place, and time. Normal muscle tone coordination. No cranial nerve deficit noted. PSYCHIATRIC: Normal mood and affect. Normal behavior. Normal judgment and thought content. CARDIOVASCULAR: Normal heart rate noted RESPIRATORY: Effort and breath sounds normal, no problems with respiration noted ABDOMEN: No masses noted. No other overt distention noted.   PELVIC: Deferred     Assessment and Plan:      1. Decreased libido 2. Oral contraceptive pill surveillance Continue Micronor as she is satisfied with this.  - norethindrone (MICRONOR) 0.35 MG tablet; Take 1 tablet (0.35 mg total) by mouth daily.  Dispense: 90 tablet; Refill: 4 Encouraged to continue to talk to husband about other issues, suggesting involving family/friends if needed. She is hesitant to consider counseling.   Routine preventative health maintenance measures emphasized. Please refer to After Visit Summary for other counseling recommendations.   Return for any gynecologic concerns.    Total face-to-face time with patient: 15 minutes.  Over 50% of encounter was spent on counseling and coordination of care.   Jaynie Collins, MD, FACOG Obstetrician & Gynecologist, [facility] for [employer], [facility]

## 2020-02-27 ENCOUNTER — Ambulatory Visit
Admission: RE | Admit: 2020-02-27 | Discharge: 2020-02-27 | Disposition: A | Discharge status: Home / Self Care | Payer: Medicaid Other | Source: Ambulatory Visit | Attending: Nurse Practitioner | Admitting: Nurse Practitioner

## 2020-02-27 ENCOUNTER — Other Ambulatory Visit: Payer: Self-pay

## 2020-02-27 DIAGNOSIS — Z1231 Encounter for screening mammogram for malignant neoplasm of breast: Secondary | ICD-10-CM

## 2020-05-12 ENCOUNTER — Encounter: Payer: Self-pay | Admitting: Nurse Practitioner

## 2020-05-12 ENCOUNTER — Telehealth: Payer: Self-pay

## 2020-05-12 NOTE — Telephone Encounter (Signed)
Pt requested an ENT referral.   Copied from CRM [id number]. Topic: Referral - Request for Referral >> May 12, 2020 10:14 AM Marylen Ponto wrote: Has patient seen PCP for this complaint? yes  *If NO, is insurance requiring patient see PCP for this issue before PCP can refer them? Referral for which specialty: ENT Preferred provider/office: no specific provider Reason for referral: Pt requests call back for a referral to an ENT specialist. Cb# [phone]

## 2020-05-13 ENCOUNTER — Ambulatory Visit
Admission: RE | Admit: 2020-05-13 | Discharge: 2020-05-13 | Disposition: A | Discharge status: Home / Self Care | Payer: Medicaid Other | Source: Ambulatory Visit | Attending: Surgery | Admitting: Surgery

## 2020-05-13 ENCOUNTER — Other Ambulatory Visit: Payer: Self-pay | Admitting: Surgery

## 2020-05-13 DIAGNOSIS — E041 Nontoxic single thyroid nodule: Secondary | ICD-10-CM

## 2020-05-18 NOTE — Telephone Encounter (Signed)
Scheduled for primary care office visit prior to referral being made to ENT

## 2020-05-31 ENCOUNTER — Ambulatory Visit: Payer: Medicaid Other | Admitting: Family

## 2020-05-31 ENCOUNTER — Ambulatory Visit: Payer: Medicaid Other | Admitting: Family Medicine

## 2020-06-02 ENCOUNTER — Ambulatory Visit: Payer: Medicaid Other | Attending: Nurse Practitioner | Admitting: Nurse Practitioner

## 2020-06-02 ENCOUNTER — Other Ambulatory Visit: Payer: Self-pay

## 2020-06-02 ENCOUNTER — Encounter: Payer: Self-pay | Admitting: Nurse Practitioner

## 2020-06-02 VITALS — BP 95/62 | HR 70 | Temp 97.5°F | Ht 65.0 in | Wt 183.0 lb

## 2020-06-02 DIAGNOSIS — Z23 Encounter for immunization: Secondary | ICD-10-CM | POA: Diagnosis not present

## 2020-06-02 DIAGNOSIS — R682 Dry mouth, unspecified: Secondary | ICD-10-CM

## 2020-06-02 NOTE — Progress Notes (Signed)
Assessment & Plan:  Roberta Reynolds was seen today for dry throat.  Diagnoses and all orders for this visit:  Dry mouth -     Ambulatory referral to ENT  Need for immunization against influenza -     Flu Vaccine QUAD 36+ mos IM    Patient has been counseled on age-appropriate routine health concerns for screening and prevention. These are reviewed and up-to-date. Referrals have been placed accordingly. Immunizations are up-to-date or declined.    Subjective:   Chief Complaint  Patient presents with  . dry throat    Pt. stated she get dryness in her throat through out the day and night. She tried sore throat OTC medication, and it does not help.    HPI Roberta Reynolds 39 y.o. female presents to office today with complaints of dry throat and foreign sensation in her esophagus that keeps her up at night. She denies symptoms of pharyngitis, dysphagia, productive or dry cough, postnasal drip. States symptoms have been present for several years. She has never sought treatment for this. Physical exam was unremarkable today. I have recommended Biotene products however she is requesting to see a specialist.    Review of Systems  Constitutional: Negative for fever, malaise/fatigue and weight loss.  HENT: Negative for nosebleeds.        SEE HPI  Eyes: Negative.  Negative for blurred vision, double vision and photophobia.  Respiratory: Negative.  Negative for cough and shortness of breath.   Cardiovascular: Negative.  Negative for chest pain, palpitations and leg swelling.  Gastrointestinal: Negative.  Negative for heartburn, nausea and vomiting.  Musculoskeletal: Negative.  Negative for myalgias.  Neurological: Negative.  Negative for dizziness, focal weakness, seizures and headaches.  Psychiatric/Behavioral: Negative.  Negative for suicidal ideas.    Past Medical History:  Diagnosis Date  . Complication of anesthesia    spinal heachache wants general anesthesia for CS  . Depression   .  Dyspnea   . GERD (gastroesophageal reflux disease)   . Spinal headache    for four days    Past Surgical History:  Procedure Laterality Date  . CESAREAN SECTION    . CESAREAN SECTION N/A 06/18/2018   Procedure: CESAREAN SECTION;  Surgeon: [place] Bing, MD;  Location: [facility] BIRTHING SUITES;  Service: Obstetrics;  Laterality: N/A;  . CESAREAN SECTION    . CHOLECYSTECTOMY N/A 11/05/2019   Procedure: LAPAROSCOPIC CHOLECYSTECTOMY WITH INTRAOPERATIVE CHOLANGIOGRAM;  Surgeon: [name], MD;  Location: [facility] ORS;  Service: General;  Laterality: N/A;    Family History  Problem Relation Age of Onset  . Cancer Maternal Aunt   . Cancer Paternal Uncle   . Hypertension Father   . Breast cancer Paternal Aunt   . Breast cancer Cousin     Social History Reviewed with no changes to be made today.   Outpatient Medications Prior to Visit  Medication Sig Dispense Refill  . norethindrone (MICRONOR) 0.35 MG tablet Take 1 tablet (0.35 mg total) by mouth daily. 90 tablet 4   No facility-administered medications prior to visit.    Allergies  Allergen Reactions  . Other Other (See Comments)    Continuous Epidural Tray- caused a "Spinal headache" and "I could not stand"   . Chalk Itching  . Sprintec 28 [Norgestimate-Eth Estradiol] Other (See Comments)    Affected the libido negatively (low sexual drive)       Objective:    BP 95/62 (BP Location: Left Arm, Patient Position: Sitting, Cuff Size: Large)   Pulse 70  Temp (!) 97.5 F (36.4 C) (Temporal)   Ht 5\' 5"  (1.651 m)   Wt 183 lb (83 kg)   LMP 05/31/2020   SpO2 100%   BMI 30.45 kg/m  Wt Readings from Last 3 Encounters:  06/02/20 183 lb (83 kg)  02/23/20 182 lb 11.2 oz (82.9 kg)  02/04/20 182 lb (82.6 kg)    Physical Exam Vitals and nursing note reviewed.  Constitutional:      Appearance: She is well-developed and well-nourished.  HENT:     Head: Normocephalic and atraumatic.     Right Ear: Hearing, tympanic membrane,  ear canal and external ear normal.     Left Ear: Hearing, tympanic membrane, ear canal and external ear normal.     Nose: Nose normal.     Right Turbinates: Not enlarged, swollen or pale.     Left Turbinates: Not enlarged, swollen or pale.     Right Sinus: No maxillary sinus tenderness or frontal sinus tenderness.     Left Sinus: No maxillary sinus tenderness or frontal sinus tenderness.     Mouth/Throat:     Mouth: Mucous membranes are moist. No oral lesions.     Pharynx: Oropharynx is clear. Uvula midline. No pharyngeal swelling, oropharyngeal exudate, posterior oropharyngeal erythema or uvula swelling.     Tonsils: No tonsillar exudate or tonsillar abscesses.  Eyes:     Extraocular Movements: EOM normal.  Cardiovascular:     Rate and Rhythm: Normal rate and regular rhythm.     Pulses: Intact distal pulses.     Heart sounds: Normal heart sounds. No murmur heard. No friction rub. No gallop.   Pulmonary:     Effort: Pulmonary effort is normal. No tachypnea or respiratory distress.     Breath sounds: Normal breath sounds. No decreased breath sounds, wheezing, rhonchi or rales.  Chest:     Chest wall: No tenderness.  Abdominal:     General: Bowel sounds are normal.     Palpations: Abdomen is soft.  Musculoskeletal:        General: No edema. Normal range of motion.     Cervical back: Normal range of motion.  Skin:    General: Skin is warm and dry.  Neurological:     Mental Status: She is alert and oriented to person, place, and time.     Coordination: Coordination normal.  Psychiatric:        Mood and Affect: Mood and affect normal.        Behavior: Behavior normal. Behavior is cooperative.        Thought Content: Thought content normal.        Judgment: Judgment normal.          Patient has been counseled extensively about nutrition and exercise as well as the importance of adherence with medications and regular follow-up. The patient was given clear instructions to go to  ER or return to medical center if symptoms don't improve, worsen or new problems develop. The patient verbalized understanding.   Follow-up: Return in about 6 months (around 12/01/2020).   01/31/2021, FNP-BC [facility] [place], [place]   06/06/2020, 7:19 PM

## 2020-06-06 ENCOUNTER — Encounter: Payer: Self-pay | Admitting: Nurse Practitioner

## 2020-06-29 ENCOUNTER — Encounter: Payer: Self-pay | Admitting: Nurse Practitioner

## 2020-06-30 ENCOUNTER — Encounter: Payer: Self-pay | Admitting: Nurse Practitioner

## 2020-11-08 ENCOUNTER — Encounter: Payer: Self-pay | Admitting: Nurse Practitioner

## 2020-12-15 ENCOUNTER — Ambulatory Visit: Payer: Medicaid Other | Admitting: Physician Assistant

## 2021-02-07 ENCOUNTER — Ambulatory Visit: Payer: Medicaid Other | Admitting: Nurse Practitioner

## 2021-03-16 IMAGING — MG DIGITAL SCREENING BILAT W/ TOMO W/ CAD
8 series · 9 of 24 positions shown · non-contrast
Comparison: None.

CLINICAL DATA: Screening.

EXAM:
DIGITAL SCREENING BILATERAL MAMMOGRAM WITH TOMO AND CAD

[L CC synth-2D]
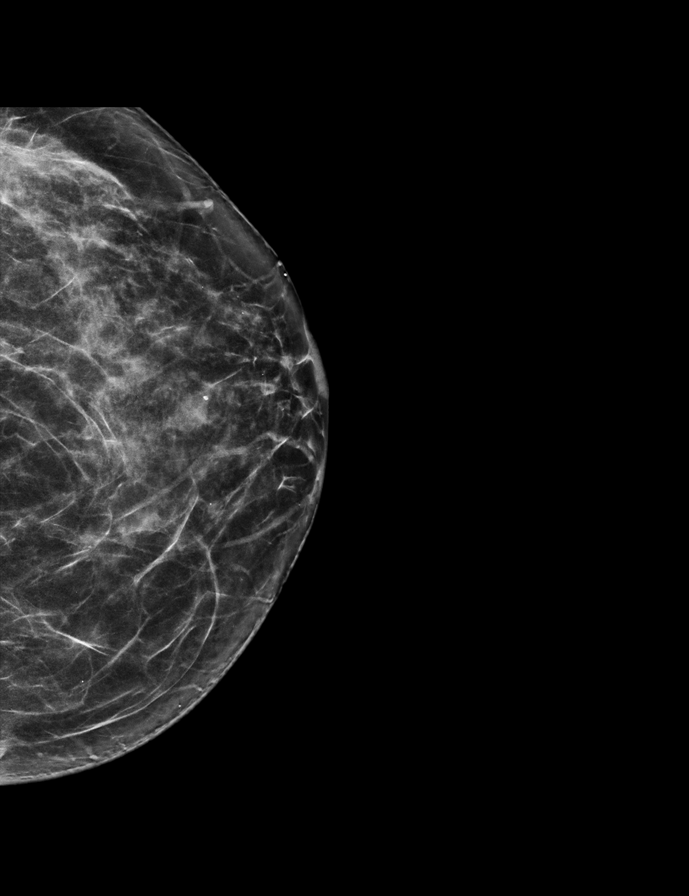

[R CC synth-2D]
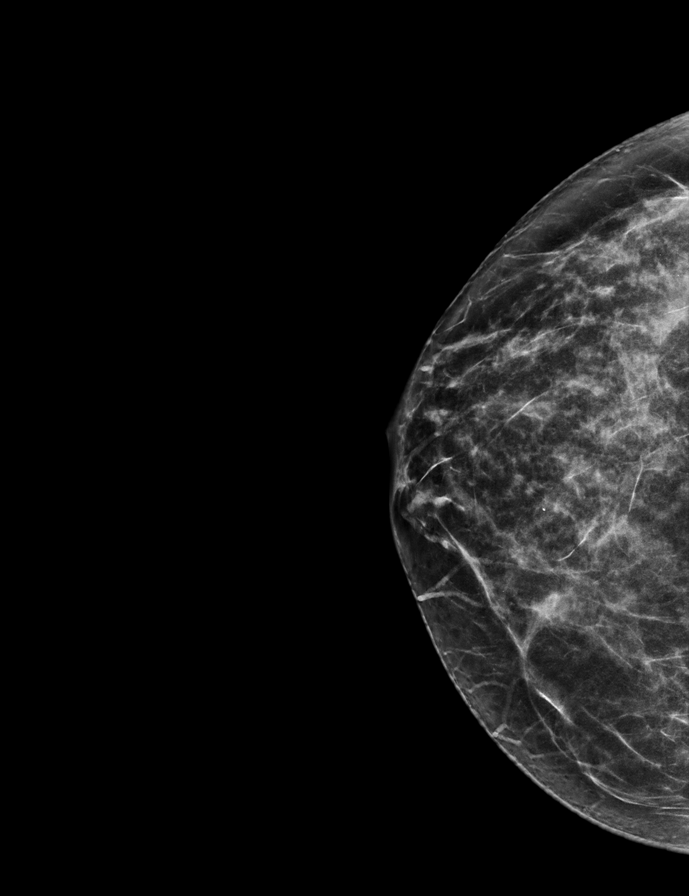

[R MLO synth-2D]
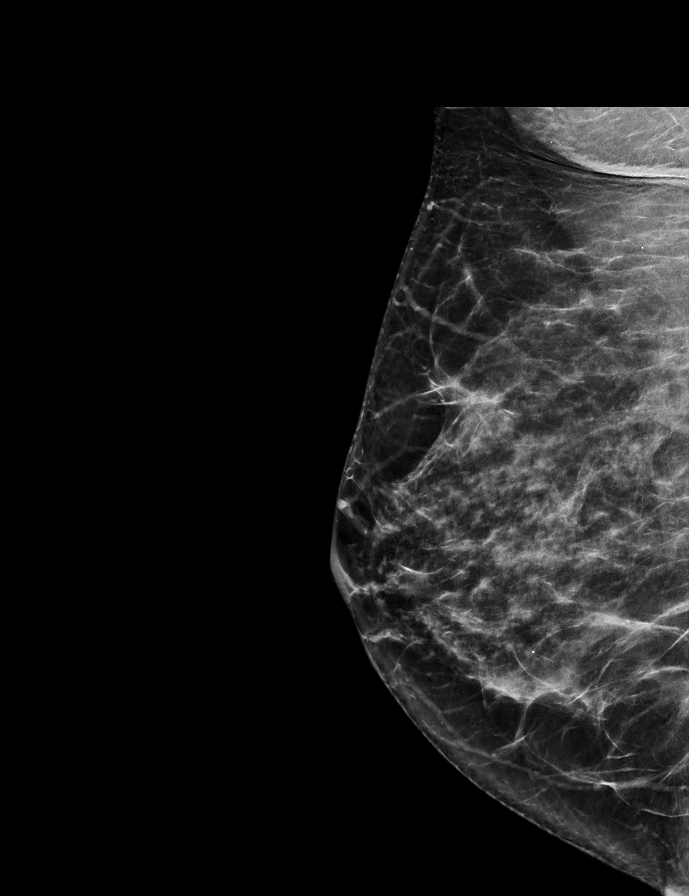

[L MLO synth-2D]
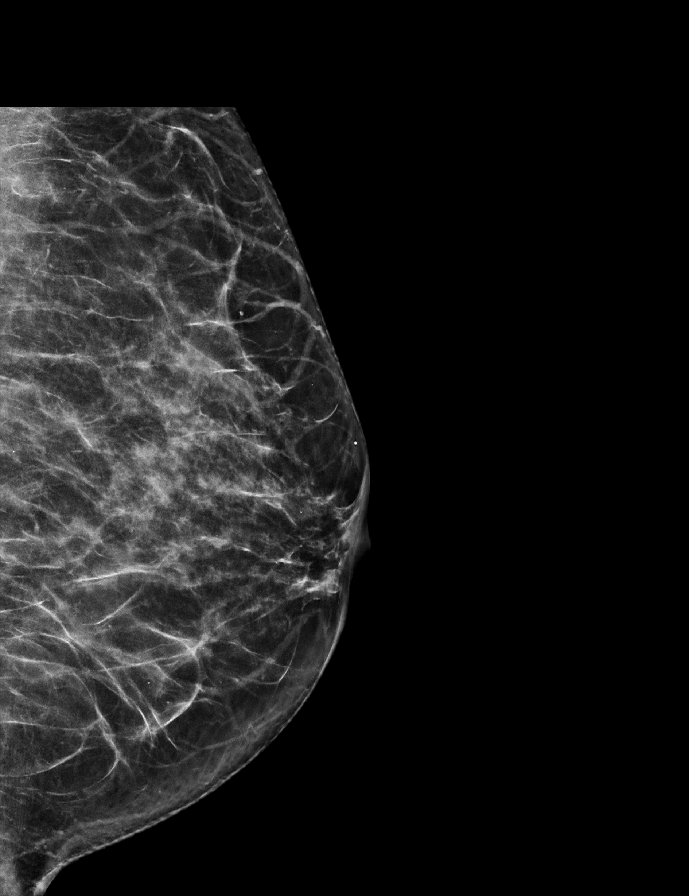

[L MLO tomo · 2 of 65 frames shown]
[frame 21/65]
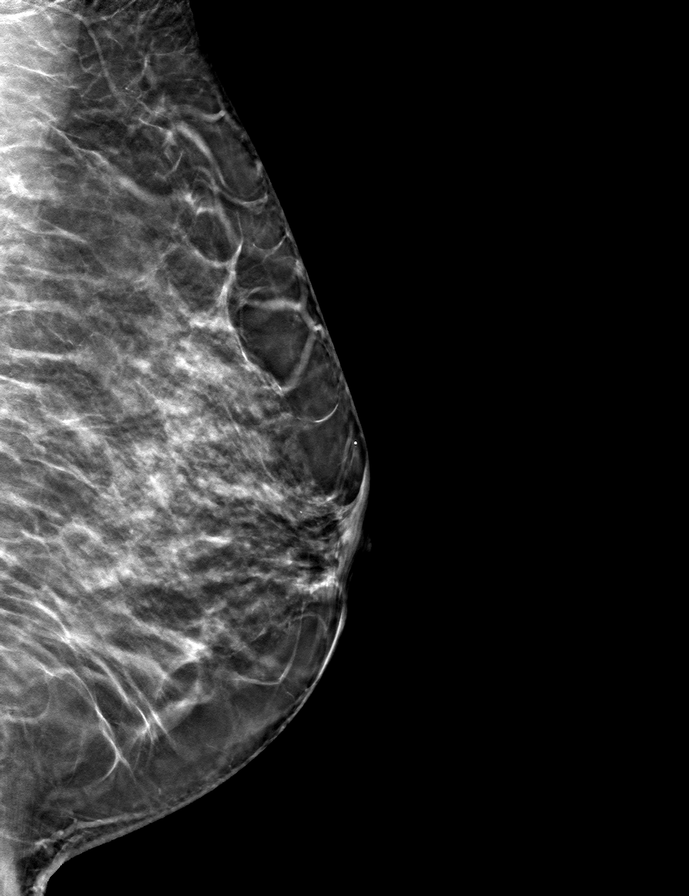
[frame 33/65]
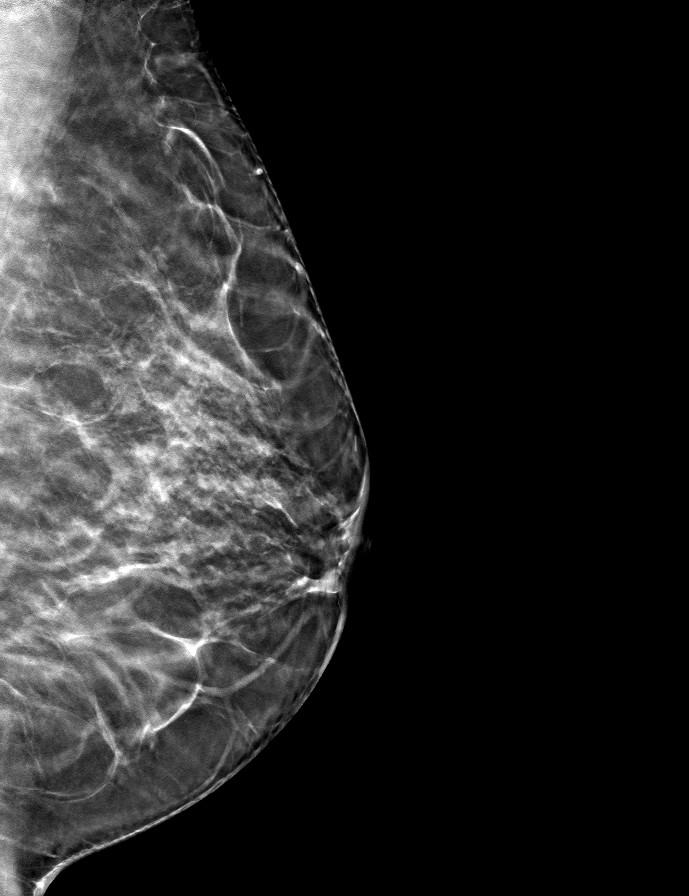

[R MLO tomo · tomo slice 35/69.0]
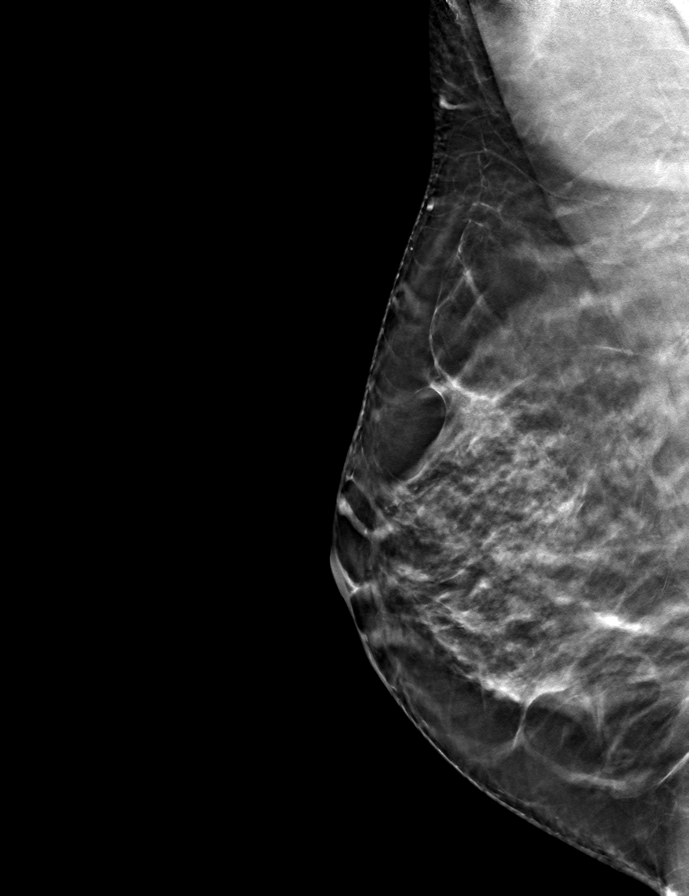

[L CC tomo · tomo slice 33/64.0]
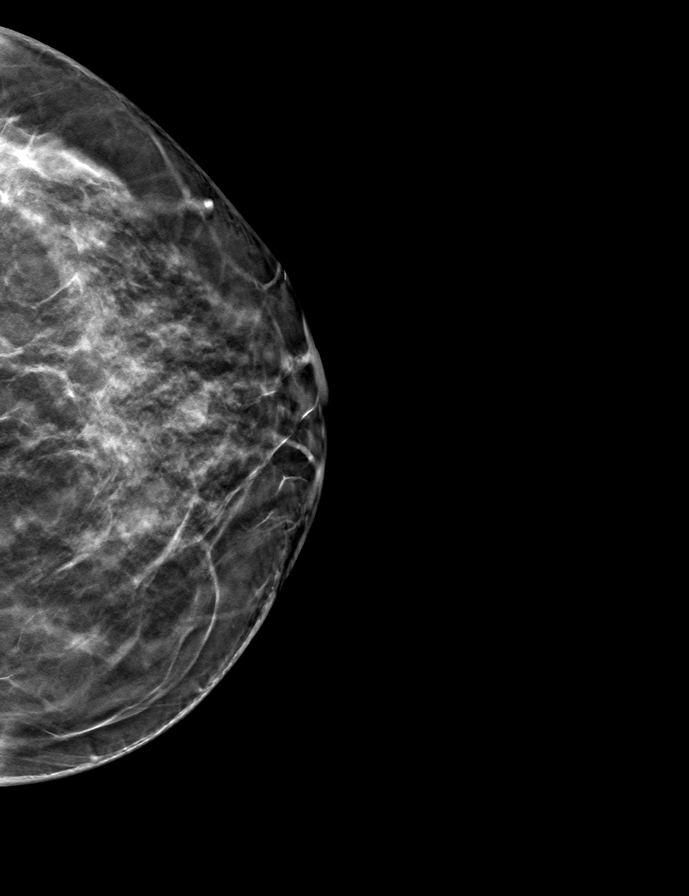

[R CC tomo · tomo slice 31/60.0]
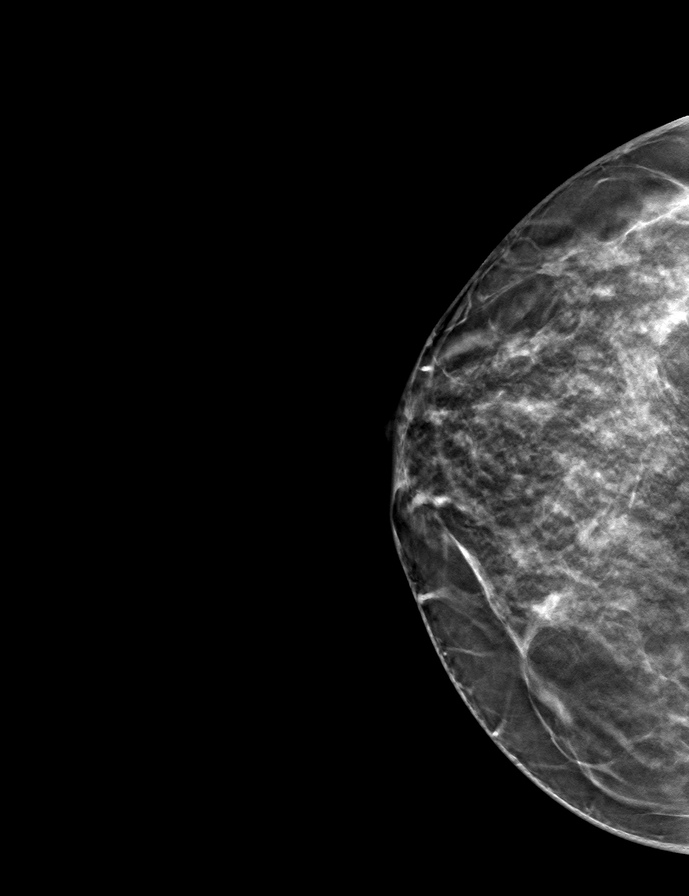

[9 of 24 positions shown; findings below may reference images not displayed]

ACR Breast Density Category c: The breast tissue is heterogeneously
dense, which may obscure small masses
FINDINGS: There are no findings suspicious for malignancy. Images were
processed with CAD.
IMPRESSION: No mammographic evidence of malignancy. A result letter of this
screening mammogram will be mailed directly to the patient.

RECOMMENDATION:
Screening mammogram in one year. (Code:EM-2-IHY)

BI-RADS CATEGORY  1: Negative.

## 2021-03-25 ENCOUNTER — Other Ambulatory Visit: Payer: Self-pay

## 2021-03-25 ENCOUNTER — Ambulatory Visit: Payer: Medicaid Other | Attending: Nurse Practitioner | Admitting: Nurse Practitioner

## 2021-03-25 ENCOUNTER — Encounter: Payer: Self-pay | Admitting: Nurse Practitioner

## 2021-03-25 VITALS — BP 103/71 | HR 66 | Resp 16 | Wt 176.6 lb

## 2021-03-25 DIAGNOSIS — R42 Dizziness and giddiness: Secondary | ICD-10-CM | POA: Diagnosis not present

## 2021-03-25 DIAGNOSIS — D509 Iron deficiency anemia, unspecified: Secondary | ICD-10-CM | POA: Diagnosis not present

## 2021-03-25 DIAGNOSIS — E042 Nontoxic multinodular goiter: Secondary | ICD-10-CM | POA: Diagnosis not present

## 2021-03-25 DIAGNOSIS — H8113 Benign paroxysmal vertigo, bilateral: Secondary | ICD-10-CM | POA: Diagnosis not present

## 2021-03-25 DIAGNOSIS — Z23 Encounter for immunization: Secondary | ICD-10-CM | POA: Diagnosis not present

## 2021-03-25 DIAGNOSIS — R059 Cough, unspecified: Secondary | ICD-10-CM | POA: Diagnosis not present

## 2021-03-25 DIAGNOSIS — E785 Hyperlipidemia, unspecified: Secondary | ICD-10-CM | POA: Diagnosis not present

## 2021-03-25 DIAGNOSIS — E8881 Metabolic syndrome: Secondary | ICD-10-CM | POA: Diagnosis not present

## 2021-03-25 DIAGNOSIS — R7309 Other abnormal glucose: Secondary | ICD-10-CM

## 2021-03-25 DIAGNOSIS — K219 Gastro-esophageal reflux disease without esophagitis: Secondary | ICD-10-CM | POA: Insufficient documentation

## 2021-03-25 DIAGNOSIS — Z130 Encounter for screening for diseases of the blood and blood-forming organs and certain disorders involving the immune mechanism: Secondary | ICD-10-CM

## 2021-03-25 MED ORDER — FAMOTIDINE 20 MG PO TABS: 20.0000 mg | ORAL_TABLET | Freq: Every day | ORAL | 1 refills | Status: DC

## 2021-03-25 NOTE — Progress Notes (Signed)
176.6 

## 2021-03-25 NOTE — Progress Notes (Signed)
Assessment & Plan:  Roberta Reynolds was seen today for cough and dizziness.  Diagnoses and all orders for this visit:  Cough in adult -     famotidine (PEPCID) 20 MG tablet; Take 1 tablet (20 mg total) by mouth at bedtime.  Dyslipidemia, goal LDL below 100 -     Lipid panel -     TSH  Elevated glucose -     CMP14+EGFR -     Hemoglobin A1c -     TSH  Screening for deficiency anemia -     CBC  Insulin resistance -     TSH -     Cancel: Insulin, random  Multiple thyroid nodules -     TSH -     US THYROID; Future  Need for immunization against influenza -     Flu Vaccine QUAD 34moIM (Fluarix, Fluzone & Alfiuria Quad PF)  Benign positional vertigo, bilateral -     CMP14+EGFR -     CBC -     TSH   Patient has been counseled on age-appropriate routine health concerns for screening and prevention. These are reviewed and up-to-date. Referrals have been placed accordingly. Immunizations are up-to-date or declined.    Subjective:   Chief Complaint  Patient presents with   Cough   Dizziness   Cough Pertinent negatives include no chest pain, fever, headaches, heartburn, myalgias, shortness of breath or weight loss.  Dizziness Associated symptoms include coughing. Pertinent negatives include no chest pain, fever, headaches, myalgias, nausea or vomiting.  Roberta Reynolds 40y.o. female presents to office today with complaints of cough.  She has a past medical history of Complication of anesthesia, Depression, Dyspnea, GERD, and Spinal headache.   COUGH Cough: Patient complains of nonproductive cough.  Symptoms began a few months ago.  The cough is non-productive, without wheezing, dyspnea or hemoptysis and is aggravated by reclining position and worse at night. Associated symptoms include:postnasal drip. Patient does not have new pets. Patient does not have a history of asthma. Patient does not have a history of environmental allergens. Patient does not have recent travel. Patient does  not have a history of smoking. Patient  does not have previous Chest X-ray.    She is having difficulty losing weight. Glucose elevated as well with previous lab work.   History of multiple right thyroid nodules [place]Thyroid 05-13-2020 IMPRESSION: Multinodular/multi cystic thyroid again demonstrated.   Review of Systems  Constitutional:  Negative for fever, malaise/fatigue and weight loss.  HENT: Negative.  Negative for nosebleeds.   Eyes: Negative.  Negative for blurred vision, double vision and photophobia.  Respiratory:  Positive for cough. Negative for shortness of breath.   Cardiovascular: Negative.  Negative for chest pain, palpitations and leg swelling.  Gastrointestinal: Negative.  Negative for heartburn, nausea and vomiting.  Musculoskeletal: Negative.  Negative for myalgias.  Neurological:  Positive for dizziness (positional and only lasting a few seconds). Negative for focal weakness, seizures and headaches.  Psychiatric/Behavioral:  Negative for suicidal ideas. The patient is nervous/anxious.    Past Medical History:  Diagnosis Date   Complication of anesthesia    spinal heachache wants general anesthesia for CS   Depression    Dyspnea    GERD (gastroesophageal reflux disease)    Spinal headache    for four days    Past Surgical History:  Procedure Laterality Date   CESAREAN SECTION     CESAREAN SECTION N/A 06/18/2018   Procedure: CESAREAN SECTION;  Surgeon: PAletha Halim MD;  Location: [facility];  Service: Obstetrics;  Laterality: N/A;   CESAREAN SECTION     CHOLECYSTECTOMY N/A 11/05/2019   Procedure: LAPAROSCOPIC CHOLECYSTECTOMY WITH INTRAOPERATIVE CHOLANGIOGRAM;  Surgeon: Coralie Keens, MD;  Location: [facility];  Service: General;  Laterality: N/A;    Family History  Problem Relation Age of Onset   Cancer Maternal Aunt    Cancer Paternal Uncle    Hypertension Father    Breast cancer Paternal Aunt    Breast cancer Cousin     Social History  Reviewed with no changes to be made today.   Outpatient Medications Prior to Visit  Medication Sig Dispense Refill   norethindrone (MICRONOR) 0.35 MG tablet Take 1 tablet (0.35 mg total) by mouth daily. 90 tablet 4   No facility-administered medications prior to visit.    Allergies  Allergen Reactions   Other Other (See Comments)    Continuous Epidural Tray- caused a "Spinal headache" and "I could not stand"    Chalk Itching   Sprintec 28 [Norgestimate-Eth Estradiol] Other (See Comments)    Affected the libido negatively (low sexual drive)       Objective:    BP 103/71   Pulse 66   Resp 16   Wt 176 lb 9.6 oz (80.1 kg)   SpO2 98%   BMI 29.39 kg/m  Wt Readings from Last 3 Encounters:  03/25/21 176 lb 9.6 oz (80.1 kg)  06/02/20 183 lb (83 kg)  02/23/20 182 lb 11.2 oz (82.9 kg)    Physical Exam Vitals and nursing note reviewed.  Constitutional:      Appearance: She is well-developed.  HENT:     Head: Normocephalic and atraumatic.  Cardiovascular:     Rate and Rhythm: Normal rate and regular rhythm.     Heart sounds: Normal heart sounds. No murmur heard.   No friction rub. No gallop.  Pulmonary:     Effort: Pulmonary effort is normal. No tachypnea or respiratory distress.     Breath sounds: Normal breath sounds. No decreased breath sounds, wheezing, rhonchi or rales.  Chest:     Chest wall: No tenderness.  Abdominal:     General: Bowel sounds are normal.     Palpations: Abdomen is soft.  Musculoskeletal:        General: Normal range of motion.     Cervical back: Normal range of motion.  Skin:    General: Skin is warm and dry.  Neurological:     Mental Status: She is alert and oriented to person, place, and time.     Coordination: Coordination normal.  Psychiatric:        Behavior: Behavior normal. Behavior is cooperative.        Thought Content: Thought content normal.        Judgment: Judgment normal.         Patient has been counseled extensively  about nutrition and exercise as well as the importance of adherence with medications and regular follow-up. The patient was given clear instructions to go to ER or return to medical center if symptoms don't improve, worsen or new problems develop. The patient verbalized understanding.   Follow-up: Return for tele with me in 3 weeks for GERD symptoms. Any tuesday. See me later for PAP.Marland Kitchen   Gildardo Pounds, FNP-BC [facility] and [place], [place]   03/25/2021, 2:10 PM

## 2021-03-26 ENCOUNTER — Encounter: Payer: Self-pay | Admitting: Nurse Practitioner

## 2021-03-26 DIAGNOSIS — R7989 Other specified abnormal findings of blood chemistry: Secondary | ICD-10-CM

## 2021-03-26 LAB — CMP14+EGFR
ALT: 14 IU/L (ref 0–32)
AST: 16 IU/L (ref 0–40)
Albumin/Globulin Ratio: 2.2 (ref 1.2–2.2)
Albumin: 4.8 g/dL (ref 3.8–4.8)
Alkaline Phosphatase: 47 IU/L (ref 44–121)
BUN/Creatinine Ratio: 9 (ref 9–23)
BUN: 6 mg/dL (ref 6–24)
Bilirubin Total: 0.3 mg/dL (ref 0.0–1.2)
CO2: 23 mmol/L (ref 20–29)
Calcium: 9.7 mg/dL (ref 8.7–10.2)
Chloride: 102 mmol/L (ref 96–106)
Creatinine, Ser: 0.68 mg/dL (ref 0.57–1.00)
Globulin, Total: 2.2 g/dL (ref 1.5–4.5)
Glucose: 79 mg/dL (ref 70–99)
Potassium: 4.4 mmol/L (ref 3.5–5.2)
Sodium: 140 mmol/L (ref 134–144)
Total Protein: 7 g/dL (ref 6.0–8.5)
eGFR: 111 mL/min/{1.73_m2} (ref >59)

## 2021-03-26 LAB — LIPID PANEL
Chol/HDL Ratio: 3.7 ratio (ref 0.0–4.4)
Cholesterol, Total: 202 mg/dL — ABNORMAL HIGH (ref 100–199)
HDL: 54 mg/dL (ref >39)
LDL Chol Calc (NIH): 131 mg/dL — ABNORMAL HIGH (ref 0–99)
Triglycerides: 94 mg/dL (ref 0–149)
VLDL Cholesterol Cal: 17 mg/dL (ref 5–40)

## 2021-03-26 LAB — CBC
Hematocrit: 39.2 % (ref 34.0–46.6)
Hemoglobin: 12.3 g/dL (ref 11.1–15.9)
MCH: 22.3 pg — ABNORMAL LOW (ref 26.6–33.0)
MCHC: 31.4 g/dL — ABNORMAL LOW (ref 31.5–35.7)
MCV: 71 fL — ABNORMAL LOW (ref 79–97)
Platelets: 332 10*3/uL (ref 150–450)
RBC: 5.51 x10E6/uL — ABNORMAL HIGH (ref 3.77–5.28)
RDW: 14.4 % (ref 11.7–15.4)
WBC: 8 10*3/uL (ref 3.4–10.8)

## 2021-03-26 LAB — TSH: TSH: 0.263 u[IU]/mL — ABNORMAL LOW (ref 0.450–4.500)

## 2021-03-26 LAB — HEMOGLOBIN A1C
Est. average glucose Bld gHb Est-mCnc: 120 mg/dL
Hgb A1c MFr Bld: 5.8 % — ABNORMAL HIGH (ref 4.8–5.6)

## 2021-03-27 ENCOUNTER — Other Ambulatory Visit: Payer: Self-pay | Admitting: Nurse Practitioner

## 2021-03-27 DIAGNOSIS — Z1211 Encounter for screening for malignant neoplasm of colon: Secondary | ICD-10-CM

## 2021-03-27 MED ORDER — FERROUS GLUCONATE 324 (38 FE) MG PO TABS: 324.0000 mg | ORAL_TABLET | Freq: Every day | ORAL | 3 refills | Status: DC

## 2021-03-31 ENCOUNTER — Other Ambulatory Visit: Payer: Self-pay | Admitting: Nurse Practitioner

## 2021-03-31 DIAGNOSIS — Z1231 Encounter for screening mammogram for malignant neoplasm of breast: Secondary | ICD-10-CM

## 2021-04-05 ENCOUNTER — Ambulatory Visit (HOSPITAL_COMMUNITY)
Admission: RE | Admit: 2021-04-05 | Discharge: 2021-04-05 | Disposition: A | Discharge status: Home / Self Care | Payer: Medicaid Other | Source: Ambulatory Visit | Attending: Nurse Practitioner | Admitting: Nurse Practitioner

## 2021-04-05 ENCOUNTER — Other Ambulatory Visit: Payer: Self-pay

## 2021-04-05 DIAGNOSIS — E042 Nontoxic multinodular goiter: Secondary | ICD-10-CM | POA: Insufficient documentation

## 2021-04-11 ENCOUNTER — Encounter: Payer: Self-pay | Admitting: Nurse Practitioner

## 2021-04-12 ENCOUNTER — Encounter: Payer: Self-pay | Admitting: Nurse Practitioner

## 2021-04-12 ENCOUNTER — Ambulatory Visit: Payer: Medicaid Other | Attending: Nurse Practitioner | Admitting: Nurse Practitioner

## 2021-04-12 ENCOUNTER — Other Ambulatory Visit: Payer: Self-pay

## 2021-04-12 DIAGNOSIS — R059 Cough, unspecified: Secondary | ICD-10-CM | POA: Diagnosis not present

## 2021-04-12 DIAGNOSIS — E7800 Pure hypercholesterolemia, unspecified: Secondary | ICD-10-CM | POA: Diagnosis not present

## 2021-04-12 MED ORDER — FLUTICASONE PROPIONATE 50 MCG/ACT NA SUSP: 1.0000 | Freq: Every day | NASAL | 1 refills | Status: DC

## 2021-04-12 MED ORDER — FAMOTIDINE 20 MG PO TABS: 20.0000 mg | ORAL_TABLET | Freq: Two times a day (BID) | ORAL | 0 refills | Status: DC

## 2021-04-12 NOTE — Progress Notes (Signed)
Virtual Visit via Telephone Note Due to national recommendations of social distancing due to COVID 19, telehealth visit is felt to be most appropriate for this patient at this time.  I discussed the limitations, risks, security and privacy concerns of performing an evaluation and management service by telephone and the availability of in person appointments. I also discussed with the patient that there may be a patient responsible charge related to this service. The patient expressed understanding and agreed to proceed.    I connected with Roberta Reynolds on 04/12/21  at   1:30 PM EDT  EDT by telephone and verified that I am speaking with the correct person using two identifiers.  Location of Patient: Private Residence   Location of Provider: [facility]    Persons participating in Telemedicine visit: Bertram Denver FNP-BC Genise Olguin    History of Present Illness: Telemedicine visit for: Cough  She was prescribed famotidine 20 mg nightly last month for chronic non productive cough which started a few months ago. The cough is non-productive, without wheezing, dyspnea or hemoptysis and is aggravated by reclining position and worse at night. Associated symptoms include:postnasal drip, scratchy throat. Patient does not have new pets. Patient does not have a history of asthma. Patient does not have a history of environmental allergens. Patient does not have recent travel. Patient does not have a history of smoking. Patient  does not have previous Chest X-ray.     Past Medical History:  Diagnosis Date   Complication of anesthesia    spinal heachache wants general anesthesia for CS   Depression    Dyspnea    GERD (gastroesophageal reflux disease)    Spinal headache    for four days    Past Surgical History:  Procedure Laterality Date   CESAREAN SECTION     CESAREAN SECTION N/A 06/18/2018   Procedure: CESAREAN SECTION;  Surgeon: Crowley Bing, MD;   Location: [facility];  Service: Obstetrics;  Laterality: N/A;   CESAREAN SECTION     CHOLECYSTECTOMY N/A 11/05/2019   Procedure: LAPAROSCOPIC CHOLECYSTECTOMY WITH INTRAOPERATIVE CHOLANGIOGRAM;  Surgeon: [name], MD;  Location: [facility];  Service: General;  Laterality: N/A;    Family History  Problem Relation Age of Onset   Cancer Maternal Aunt    Cancer Paternal Uncle    Hypertension Father    Breast cancer Paternal Aunt    Breast cancer Cousin     Social History   Socioeconomic History   Marital status: Married    Spouse name: Not on file   Number of children: 3   Years of education: 16   Highest education level: Not on file  Occupational History   Not on file  Tobacco Use   Smoking status: Never   Smokeless tobacco: Never  Vaping Use   Vaping Use: Never used  Substance and Sexual Activity   Alcohol use: No   Drug use: No   Sexual activity: Yes    Partners: Male    Birth control/protection: None  Other Topics Concern   Not on file  Social History Narrative   Not on file   Social Determinants of Health   Financial Resource Strain: Not on file  Food Insecurity: Not on file  Transportation Needs: Not on file  Physical Activity: Not on file  Stress: Not on file  Social Connections: Not on file     Observations/Objective: Awake, alert and oriented x 3   Review of Systems  Constitutional:  Negative  for fever, malaise/fatigue and weight loss.  HENT:  Negative for nosebleeds.        SEE HPI  Eyes: Negative.  Negative for blurred vision, double vision and photophobia.  Respiratory:  Positive for cough. Negative for hemoptysis, sputum production and shortness of breath.   Cardiovascular: Negative.  Negative for chest pain, palpitations and leg swelling.  Gastrointestinal: Negative.  Negative for heartburn, nausea and vomiting.  Musculoskeletal: Negative.  Negative for myalgias.  Neurological: Negative.  Negative for dizziness, focal weakness,  seizures and headaches.  Psychiatric/Behavioral: Negative.  Negative for suicidal ideas.    Assessment and Plan: Diagnoses and all orders for this visit:  Cough in adult -     famotidine (PEPCID) 20 MG tablet; Take 1 tablet (20 mg total) by mouth 2 (two) times daily. -     fluticasone (FLONASE) 50 MCG/ACT nasal spray; Place 1 spray into both nostrils daily.  Elevated cholesterol -     Amb ref to Medical Nutrition Therapy-MNT    Follow Up Instructions Return in about 4 weeks (around 05/10/2021).     I discussed the assessment and treatment plan with the patient. The patient was provided an opportunity to ask questions and all were answered. The patient agreed with the plan and demonstrated an understanding of the instructions.   The patient was advised to call back or seek an in-person evaluation if the symptoms worsen or if the condition fails to improve as anticipated.  I provided 8 minutes of non-face-to-face time during this encounter including median intraservice time, reviewing previous notes, labs, imaging, medications and explaining diagnosis and management.  Claiborne Rigg, FNP-BC

## 2021-04-16 ENCOUNTER — Other Ambulatory Visit: Payer: Self-pay | Admitting: Obstetrics & Gynecology

## 2021-04-16 DIAGNOSIS — Z3041 Encounter for surveillance of contraceptive pills: Secondary | ICD-10-CM

## 2021-05-04 ENCOUNTER — Ambulatory Visit: Payer: Medicaid Other

## 2021-05-09 ENCOUNTER — Other Ambulatory Visit: Payer: Self-pay

## 2021-05-09 ENCOUNTER — Ambulatory Visit: Payer: Medicaid Other | Attending: Nurse Practitioner | Admitting: Nurse Practitioner

## 2021-05-09 ENCOUNTER — Encounter: Payer: Self-pay | Admitting: Nurse Practitioner

## 2021-05-09 ENCOUNTER — Other Ambulatory Visit (HOSPITAL_COMMUNITY)
Admission: RE | Admit: 2021-05-09 | Discharge: 2021-05-09 | Disposition: A | Discharge status: Home / Self Care | Payer: Medicaid Other | Source: Ambulatory Visit | Attending: Nurse Practitioner | Admitting: Nurse Practitioner

## 2021-05-09 ENCOUNTER — Other Ambulatory Visit: Payer: Self-pay | Admitting: Nurse Practitioner

## 2021-05-09 VITALS — BP 101/69 | HR 59 | Ht 65.0 in | Wt 180.1 lb

## 2021-05-09 DIAGNOSIS — R7989 Other specified abnormal findings of blood chemistry: Secondary | ICD-10-CM

## 2021-05-09 DIAGNOSIS — Z124 Encounter for screening for malignant neoplasm of cervix: Secondary | ICD-10-CM | POA: Insufficient documentation

## 2021-05-09 NOTE — Progress Notes (Signed)
pap  Assessment & Plan:  Roberta Reynolds was seen today for gynecologic exam.  Diagnoses and all orders for this visit:  Encounter for Papanicolaou smear for cervical cancer screening -     Cytology - PAP -     Cervicovaginal ancillary only   Patient has been counseled on age-appropriate routine health concerns for screening and prevention. These are reviewed and up-to-date. Referrals have been placed accordingly. Immunizations are up-to-date or declined.    Subjective:   Chief Complaint  Patient presents with   Gynecologic Exam   Roberta Reynolds 40 y.o. female presents to office today for PAP smear.   We also discussed her thyroid levels and will repeat today. She denies any current symptoms of hyperglycemia.     Review of Systems  Constitutional: Negative.  Negative for chills, fever, malaise/fatigue and weight loss.  Respiratory: Negative.  Negative for cough, shortness of breath and wheezing.   Cardiovascular: Negative.  Negative for chest pain, orthopnea and leg swelling.  Gastrointestinal:  Negative for abdominal pain.  Genitourinary: Negative.  Negative for flank pain.  Skin: Negative.  Negative for rash.  Psychiatric/Behavioral:  Negative for suicidal ideas.    Past Medical History:  Diagnosis Date   Complication of anesthesia    spinal heachache wants general anesthesia for CS   Depression    Dyspnea    GERD (gastroesophageal reflux disease)    Spinal headache    for four days    Past Surgical History:  Procedure Laterality Date   CESAREAN SECTION     CESAREAN SECTION N/A 06/18/2018   Procedure: CESAREAN SECTION;  Surgeon: [place] Bing, MD;  Location: [facility];  Service: Obstetrics;  Laterality: N/A;   CESAREAN SECTION     CHOLECYSTECTOMY N/A 11/05/2019   Procedure: LAPAROSCOPIC CHOLECYSTECTOMY WITH INTRAOPERATIVE CHOLANGIOGRAM;  Surgeon: [name], MD;  Location[facility];  Service: General;  Laterality: N/A;    Family History  Problem  Relation Age of Onset   Cancer Maternal Aunt    Cancer Paternal Uncle    Hypertension Father    Breast cancer Paternal Aunt    Breast cancer Cousin     Social History Reviewed with no changes to be made today.   Outpatient Medications Prior to Visit  Medication Sig Dispense Refill   cholecalciferol (VITAMIN D3) 25 MCG (1000 UNIT) tablet Take 1,000 Units by mouth daily.     famotidine (PEPCID) 20 MG tablet Take 1 tablet (20 mg total) by mouth 2 (two) times daily. 180 tablet 0   ferrous gluconate (FERGON) 324 MG tablet Take 1 tablet (324 mg total) by mouth daily. 90 tablet 3   fluticasone (FLONASE) 50 MCG/ACT nasal spray Place 1 spray into both nostrils daily. 18.2 mL 1   norethindrone (MICRONOR) 0.35 MG tablet TAKE 1 TABLET BY MOUTH EVERY DAY 84 tablet 4   No facility-administered medications prior to visit.    Allergies  Allergen Reactions   Other Other (See Comments)    Continuous Epidural Tray- caused a "Spinal headache" and "I could not stand"    Chalk Itching   Sprintec 28 [Norgestimate-Eth Estradiol] Other (See Comments)    Affected the libido negatively (low sexual drive)       Objective:    BP 101/69   Pulse (!) 59   Ht 5\' 5"  (1.651 m)   Wt 180 lb 2 oz (81.7 kg)   LMP 04/15/2021 (Approximate)   SpO2 100%   BMI 29.97 kg/m  Wt Readings from Last 3 Encounters:  05/09/21 180 lb 2 oz (81.7 kg)  03/25/21 176 lb 9.6 oz (80.1 kg)  06/02/20 183 lb (83 kg)    Physical Exam Exam conducted with a chaperone present.  Constitutional:      Appearance: She is well-developed.  HENT:     Head: Normocephalic.  Cardiovascular:     Rate and Rhythm: Normal rate and regular rhythm.     Heart sounds: Normal heart sounds.  Pulmonary:     Effort: Pulmonary effort is normal.     Breath sounds: Normal breath sounds.  Abdominal:     General: Bowel sounds are normal.     Palpations: Abdomen is soft.     Hernia: There is no hernia in the left inguinal area.  Genitourinary:     Exam position: Lithotomy position.     Labia:        Right: No rash, tenderness, lesion or injury.        Left: No rash, tenderness, lesion or injury.      Vagina: Normal. No signs of injury and foreign body. No vaginal discharge, erythema, tenderness or bleeding.     Cervix: No cervical motion tenderness or friability.     Uterus: Not deviated and not enlarged.      Adnexa:        Right: No mass, tenderness or fullness.         Left: No mass, tenderness or fullness.       Rectum: Normal. No external hemorrhoid.  Lymphadenopathy:     Lower Body: No right inguinal adenopathy. No left inguinal adenopathy.  Skin:    General: Skin is warm and dry.  Neurological:     Mental Status: She is alert and oriented to person, place, and time.  Psychiatric:        Behavior: Behavior normal.        Thought Content: Thought content normal.        Judgment: Judgment normal.         Patient has been counseled extensively about nutrition and exercise as well as the importance of adherence with medications and regular follow-up. The patient was given clear instructions to go to ER or return to medical center if symptoms don't improve, worsen or new problems develop. The patient verbalized understanding.   Follow-up: Return if symptoms worsen or fail to improve.   Claiborne Rigg, FNP-BC [facility] [place], [place] [phone]   05/09/2021, 10:17 AM

## 2021-05-10 LAB — CERVICOVAGINAL ANCILLARY ONLY
Bacterial Vaginitis (gardnerella): NEGATIVE
Candida Glabrata: NEGATIVE
Candida Vaginitis: NEGATIVE
Chlamydia: NEGATIVE
Comment: NEGATIVE
Comment: NEGATIVE
Comment: NEGATIVE
Comment: NEGATIVE
Comment: NEGATIVE
Comment: NORMAL
Neisseria Gonorrhea: NEGATIVE
Trichomonas: NEGATIVE

## 2021-05-10 LAB — THYROID PANEL WITH TSH
Free Thyroxine Index: 2.1 (ref 1.2–4.9)
T3 Uptake Ratio: 25 % (ref 24–39)
T4, Total: 8.3 ug/dL (ref 4.5–12.0)
TSH: 0.675 u[IU]/mL (ref 0.450–4.500)

## 2021-05-17 LAB — CYTOLOGY - PAP
Comment: NEGATIVE
High risk HPV: NEGATIVE

## 2021-05-23 ENCOUNTER — Other Ambulatory Visit: Payer: Self-pay | Admitting: Nurse Practitioner

## 2021-05-23 DIAGNOSIS — R87619 Unspecified abnormal cytological findings in specimens from cervix uteri: Secondary | ICD-10-CM

## 2021-05-23 NOTE — Progress Notes (Signed)
Abnormal PAP smear. Will need to be referred to Gynecology for further evaluation. They will call you to schedule.

## 2021-05-25 ENCOUNTER — Telehealth: Payer: Self-pay | Admitting: Radiology

## 2021-05-25 NOTE — Telephone Encounter (Signed)
Left message for patient to call to follow up from referral for abnormal PAP. Patient was previous patient at [facility]. Need to confirm that she would like to change location.

## 2021-05-30 ENCOUNTER — Encounter: Payer: Self-pay | Admitting: Registered"

## 2021-05-30 ENCOUNTER — Other Ambulatory Visit: Payer: Self-pay

## 2021-05-30 ENCOUNTER — Encounter: Payer: Medicaid Other | Attending: Nurse Practitioner | Admitting: Registered"

## 2021-05-30 DIAGNOSIS — E7800 Pure hypercholesterolemia, unspecified: Secondary | ICD-10-CM | POA: Insufficient documentation

## 2021-05-30 HISTORY — DX: Pure hypercholesterolemia, unspecified: E78.00

## 2021-05-30 NOTE — Patient Instructions (Addendum)
Accupressure may help your pain and numbness, foot reflexology may be helpful. Also tell your doctor about the numbness. [facility]  Phone: [phone]  Continue with exercise as tolerated (we can talk more details next visit about types of exercise and ways to eat to help support your workouts)  Continue doing things that help bring down your stress level.  Eating a little before working out may help keep you energized without feeling too full.  Aim to eat 3 balanced meals and snacks if hungry

## 2021-05-30 NOTE — Progress Notes (Signed)
edical Nutrition Therapy  Appointment Start time:  [phone]  Appointment End time:  1020  Primary concerns today: pre-diabetes, cholesterol  Referral diagnosis: E78.00 (ICD-10-CM) - Elevated cholesterol Preferred learning style: no preference indicated Learning readiness: ready, change in progress   NUTRITION ASSESSMENT   Anthropometrics  Wt Readings from Last 3 Encounters:  05/30/21 179 lb 14.4 oz (81.6 kg)  05/09/21 180 lb 2 oz (81.7 kg)  03/25/21 176 lb 9.6 oz (80.1 kg)     Clinical Medical Hx: family history T2DM Medications: reviewed Labs: LDL 131, A1c 5.8% Notable Signs/Symptoms: weight gain, fatigue,  Lifestyle & Dietary Hx Patient states she has three children, ages 71, 12 1/2, 69 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 e[place]ian, 42 yrs old. States she tries to control her intake, aiming for low carb, high fiber and protein. Prepares food at home, no processed meat. Only a little dark chocolate . Dinner is main meal.   Pt reports energy level high in the morning, states she feels hyperactive because she has a limited time to clean, prepare food laundry before leaving the house, then feels exhausted, sometimes takes naps, if has time.  Pt reports when she notices bumps on her face she drinks yeast with milk honey and it clears up.   Estimated daily fluid intake: "a lot"  Supplements: reviewed Sleep: "good" 6-8 hrs Stress / self-care: 7-8/10 not everyday / sewing, TV, going to the gym Current average weekly physical activity: x3 months started going back gym 5x/week when children in school, 60 min+ unless husband goes with her then less time.  24-Hr Dietary Recall First Meal: coffee with 1/2 c skim milk, dark chocolate 2 hrs later has tuna salad, fava beans eggs (avoids carbs) Snack:  Second Meal:  Snack: small piece of chocolate Third Meal: 5 pm family meal: steak, vegetables, 2-3 scoops of rice Snack: peanuts or seeds or apple OR  eggs with sprinkle of oatmeal Beverages: water, coffee, tea with 1/2 milk   Estimated Energy Needs Calories: 2200   NUTRITION DIAGNOSIS  NB-1.1 Food and nutrition-related knowledge deficit As related to structured meals.  As evidenced by Pt stated skipping lunch due to fear of weight gain and developing diabetes.   NUTRITION INTERVENTION  Nutrition education (E-1) on the following topics:  MyPlate Exercise M3N and pre-diabetes diagnosis Stress management role in health LDL cholesterol  Handouts Provided Include  Cholesterol and triglycerides MyPlate planner A1c chart  Learning Style & Readiness for Change Teaching method utilized: Visual & Auditory  Demonstrated degree of understanding via: Teach Back  Barriers to learning/adherence to lifestyle change: none  Goals Established by Pt Accupressure may help your pain and numbness, foot reflexology may be helpful. Also tell your doctor about the numbness. [facility] massage  Phone: [phone]  Continue with exercise as tolerated (we can talk more details next visit about types of exercise and ways to eat to help support your workouts)  Continue doing things that help bring down your stress level.  Eating a little before working out may help keep you energized without feeling too full.  Aim to eat 3 balanced meals and snacks if hungry   MONITORING & EVALUATION Dietary intake, weekly physical activity, and stress managment in 4-6 weeks.

## 2021-06-07 ENCOUNTER — Ambulatory Visit
Admission: RE | Admit: 2021-06-07 | Discharge: 2021-06-07 | Disposition: A | Discharge status: Home / Self Care | Payer: Medicaid Other | Source: Ambulatory Visit | Attending: Nurse Practitioner | Admitting: Nurse Practitioner

## 2021-06-07 ENCOUNTER — Other Ambulatory Visit: Payer: Self-pay

## 2021-06-07 DIAGNOSIS — Z1231 Encounter for screening mammogram for malignant neoplasm of breast: Secondary | ICD-10-CM

## 2021-06-24 ENCOUNTER — Other Ambulatory Visit: Payer: Self-pay | Admitting: Nurse Practitioner

## 2021-06-24 DIAGNOSIS — R059 Cough, unspecified: Secondary | ICD-10-CM

## 2021-06-27 ENCOUNTER — Encounter: Payer: Self-pay | Admitting: Nurse Practitioner

## 2021-06-28 MED ORDER — FAMOTIDINE 20 MG PO TABS: 20.0000 mg | ORAL_TABLET | Freq: Two times a day (BID) | ORAL | 0 refills | Status: DC

## 2021-07-19 ENCOUNTER — Ambulatory Visit (INDEPENDENT_AMBULATORY_CARE_PROVIDER_SITE_OTHER): Payer: Medicaid Other | Admitting: Obstetrics & Gynecology

## 2021-07-19 ENCOUNTER — Other Ambulatory Visit: Payer: Self-pay

## 2021-07-19 ENCOUNTER — Encounter: Payer: Self-pay | Admitting: Obstetrics & Gynecology

## 2021-07-19 VITALS — BP 110/71 | HR 71 | Wt 179.0 lb

## 2021-07-19 DIAGNOSIS — R87619 Unspecified abnormal cytological findings in specimens from cervix uteri: Secondary | ICD-10-CM

## 2021-07-19 DIAGNOSIS — R87618 Other abnormal cytological findings on specimens from cervix uteri: Secondary | ICD-10-CM | POA: Diagnosis not present

## 2021-07-19 DIAGNOSIS — N939 Abnormal uterine and vaginal bleeding, unspecified: Secondary | ICD-10-CM | POA: Diagnosis not present

## 2021-07-19 NOTE — Progress Notes (Signed)
GYNECOLOGY OFFICE VISIT NOTE  History:   Roberta Reynolds is a 42 y.o. [phone] here today for further evaluation and management of atypical endometrial cells seen on pap on 05/09/2021.  Patient also reports some increased spotting and lower abdominal pain, even with taking Micronor for the last several months. She is very anxious, does not any pelvic examination or procedures in office. She denies any cuurrent abnormal vaginal discharge, bleeding, pelvic pain or other concerns.    Past Medical History:  Diagnosis Date   Complication of anesthesia    spinal heachache wants general anesthesia for CS   Depression    Dyspnea    GERD (gastroesophageal reflux disease)    Spinal headache    for four days    Past Surgical History:  Procedure Laterality Date   CESAREAN SECTION     CESAREAN SECTION N/A 06/18/2018   Procedure: CESAREAN SECTION;  Surgeon: Jesup Bing, MD;  Location: [facility]IRTHING SUIT[facility];  Service: Obstetrics;  Laterality: N/A;   CESAREAN SECTION     CHOLECYSTECTOMY N/A 11/05/2019   Procedure: LAPAROSCOPIC CHOLECYSTECTOMY WITH INTRAOPERATIVE CHOLANGIOGRAM;  Surgeon: [name], MD;  Location: [facility];  Service: General;  Laterality: N/A;    The following portions of the patient's history were reviewed and updated as appropriate: allergies, current medications, past family history, past medical history, past social history, past surgical history and problem list.   Health Maintenance:  Normal cervical pap and negative HRHPV on 05/19/2021 but atypical endometrial cells seen.  Normal mammogram on 06/07/2021  Review of Systems:  Pertinent items noted in HPI and remainder of comprehensive ROS otherwise negative.  Physical Exam:  BP 110/71    Pulse 71    Wt 179 lb (81.2 kg)    LMP 07/03/2021 (Exact Date)    BMI 29.79 kg/m  CONSTITUTIONAL: Well-developed, well-nourished female in no acute distress.  HEENT:  Normocephalic, atraumatic. External right and left  ear normal. No scleral icterus.  NECK: Normal range of motion, supple, no masses noted on observation SKIN: No rash noted. Not diaphoretic. No erythema. No pallor. MUSCULOSKELETAL: Normal range of motion. No edema noted. NEUROLOGIC: Alert and oriented to person, place, and time. Normal muscle tone coordination. No cranial nerve deficit noted. PSYCHIATRIC: Normal mood and affect. Normal behavior. Normal judgment and thought content. CARDIOVASCULAR: Normal heart rate noted RESPIRATORY: Effort and breath sounds normal, no problems with respiration noted ABDOMEN: No masses noted. No other overt distention noted.   PELVIC: Deferred     Assessment and Plan:    1. Abnormal uterine bleeding (AUB) Having some breakthrough bleeding and pain on Micronor, will evaluate pelvic ultrasound for any anomalies. No risk factor or complaints concerning for infection or inflammation.  Will also do trial of Slynd in lieu of Micronor to see if this improves the bleeding profile; patient wants to avoid estrogen and too much hormones as she had bad side effects in the past. - US PELVIC COMPLETE WITH TRANSVAGINAL; Future  2. 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 Offered Hysteroscopy, D&C, reviewed that this is a diagnostic procedure, will follow up pathology results.  Risks of procedure reviewed in detail, also reviewed procedure details.   She was told that she will be contacted by our surgical scheduler regarding the time and date of her surgery; routine preoperative instructions will be given to her by the preoperative nursing team.   Printed patient education handouts about the procedure were given to the patient  to review at home. - US PELVIC COMPLETE WITH TRANSVAGINAL; Future  Routine preventative health maintenance measures emphasized. Please refer to After Visit Summary for other counseling recommendations.   Return for any gynecologic concerns.    I  spent 30 minutes dedicated to the care of this patient including pre-visit review of records, face to face time with the patient discussing her conditions and treatments and post visit orders.    Jaynie Collins, MD, FACOG Obstetrician & Gynecologist, [facility] for [employer], [facility]

## 2021-07-22 ENCOUNTER — Encounter: Payer: Medicaid Other | Attending: Nurse Practitioner | Admitting: Registered"

## 2021-07-22 ENCOUNTER — Other Ambulatory Visit: Payer: Self-pay

## 2021-07-22 DIAGNOSIS — E7800 Pure hypercholesterolemia, unspecified: Secondary | ICD-10-CM | POA: Insufficient documentation

## 2021-07-22 NOTE — Patient Instructions (Addendum)
Fruit is good to include in your diet. Including and apple every day is a good idea. Continue to include moderate exercise 5x/week as tolerated, listen to your body. Continue to do a variety of exercises such as yoga, resistance training, and aerobic exercise

## 2021-07-22 NOTE — Progress Notes (Signed)
Follow-up visit for Medical Nutrition Therapy  Appointment Start time:  [phone]  Appointment End time:  50  Primary concerns today: pre-diabetes, cholesterol  Referral diagnosis: E78.00 (ICD-10-CM) - Elevated cholesterol Preferred learning style: no preference indicated Learning readiness: ready, change in progress   NUTRITION ASSESSMENT   Anthropometrics  Wt Readings from Last 3 Encounters:  07/19/21 179 lb (81.2 kg)  05/30/21 179 lb 14.4 oz (81.6 kg)  05/09/21 180 lb 2 oz (81.7 kg)  Weight not assessed in follow-up visit  Clinical Medical Hx: family history T2DM Medications: reviewed Labs: LDL 131, A1c 5.8% Notable Signs/Symptoms: weight gain, fatigue,  Lifestyle & Dietary Hx Pt states she is trying to follow MD advice to exercise at an intensity that makes her sweat. Pt states she is not able to exercise at that intensity or for long periods of time because she feels a stinging in the area of her heart. Pt states her husband is experienced with physical activity and pushes her to exert more while on the treadmill.  Pt states she does other activities such as yoga, and weights which feels good to stretch and use her muscles.   Pt reports a recent episode of feeling dizzy after 5 minutes of working out at the gym.   Pt reports concern that since pre-diabetes diagnosis wanting to eat more sweets. Pt reports she is not eating fruit every day.   Pt states she has a compulsion to do the opposite when given food rules and restrictions and was relieved when in today's visit was more of guidelines instead of a strict meal plan.  Pt states her eating patterns do not vary much and have not changed since first visit. Pt states there is ice cream at the house but she does not have a desire to eat it at home, but will have a little taste when eating out. Pt states ice cream (cold sweet foods) hurts her teeth.  Estimated daily fluid intake: "a lot"  Supplements: reviewed Sleep: "good" 6-8  hrs Stress / self-care: 7-8/10 not everyday / sewing, TV, going to the gym Current average weekly physical activity: gym 5x/week 60 min+   24-Hr Dietary Recall First Meal: coffee with skim milk, sm piece dark chocolate 2 hrs later  Snack:  Second Meal: fava beans with salad sandwich Snack: small piece of chocolate Third Meal: [place] buffet, shrimp, noodles, ice cream with fruit Snack: occasionally tea with milk and small piece of dessert Beverages: water, coffee, tea with 1/2 milk, occasional juice  Estimated Energy Needs Calories: 2200   NUTRITION DIAGNOSIS  NB-1.1 Food and nutrition-related knowledge deficit As related to structured meals.  As evidenced by Pt stated skipping lunch due to fear of weight gain and developing diabetes.   NUTRITION INTERVENTION  Nutrition education (E-1) on the following topics:  Balanced eating Appropriate amount of fruit to eat Appropriate amount and types of exercise  Handouts Provided Include  MyPlate for athletes (easy training)  Learning Style & Readiness for Change Teaching method utilized: Visual & Auditory  Demonstrated degree of understanding via: Teach Back  Barriers to learning/adherence to lifestyle change: none  Goals Established by Pt Fruit is good to include in your diet. Including and apple every day is a good idea. Continue to include moderate exercise 5x/week as tolerated, listen to your body. Continue to do a variety of exercises such as yoga, resistance training, and aerobic exercise  MONITORING & EVALUATION Dietary intake, weekly physical activity, and stress managment prn.

## 2021-07-23 ENCOUNTER — Encounter: Payer: Self-pay | Admitting: Registered"

## 2021-07-26 ENCOUNTER — Other Ambulatory Visit: Payer: Self-pay

## 2021-07-26 ENCOUNTER — Ambulatory Visit
Admission: RE | Admit: 2021-07-26 | Discharge: 2021-07-26 | Disposition: A | Discharge status: Home / Self Care | Payer: Medicaid Other | Source: Ambulatory Visit | Attending: Obstetrics & Gynecology | Admitting: Obstetrics & Gynecology

## 2021-07-26 DIAGNOSIS — R87619 Unspecified abnormal cytological findings in specimens from cervix uteri: Secondary | ICD-10-CM | POA: Insufficient documentation

## 2021-07-26 DIAGNOSIS — N939 Abnormal uterine and vaginal bleeding, unspecified: Secondary | ICD-10-CM | POA: Insufficient documentation

## 2021-08-08 DIAGNOSIS — N939 Abnormal uterine and vaginal bleeding, unspecified: Secondary | ICD-10-CM | POA: Diagnosis present

## 2021-08-22 ENCOUNTER — Other Ambulatory Visit: Payer: Self-pay

## 2021-08-22 ENCOUNTER — Encounter (HOSPITAL_BASED_OUTPATIENT_CLINIC_OR_DEPARTMENT_OTHER): Payer: Self-pay | Admitting: Obstetrics & Gynecology

## 2021-08-23 ENCOUNTER — Encounter (HOSPITAL_BASED_OUTPATIENT_CLINIC_OR_DEPARTMENT_OTHER)
Admission: RE | Admit: 2021-08-23 | Discharge: 2021-08-23 | Disposition: A | Discharge status: Home / Self Care | Payer: Medicaid Other | Source: Ambulatory Visit | Attending: Obstetrics & Gynecology | Admitting: Obstetrics & Gynecology

## 2021-08-23 DIAGNOSIS — Z01812 Encounter for preprocedural laboratory examination: Secondary | ICD-10-CM | POA: Insufficient documentation

## 2021-08-23 LAB — CBC
HCT: 36.9 % (ref 36.0–46.0)
Hemoglobin: 12 g/dL (ref 12.0–15.0)
MCH: 23 pg — ABNORMAL LOW (ref 26.0–34.0)
MCHC: 32.5 g/dL (ref 30.0–36.0)
MCV: 70.7 fL — ABNORMAL LOW (ref 80.0–100.0)
Platelets: 285 10*3/uL (ref 150–400)
RBC: 5.22 MIL/uL — ABNORMAL HIGH (ref 3.87–5.11)
RDW: 14.7 % (ref 11.5–15.5)
WBC: 6.5 10*3/uL (ref 4.0–10.5)
nRBC: 0 % (ref 0.0–0.2)

## 2021-08-30 NOTE — H&P (Signed)
Preoperative History and Physical  Roberta Reynolds is a 41 y.o. [phone] here for surgical evaluation of abnormal uterine bleeding and atypical endometrial cells on recent pap smear.   She declined endometrial biopsy today due to anxiety. Offered endometrial biopsy under anesthesia, reviewed that this is a diagnostic procedure, will follow up pathology results and manage accordingly.  No significant preoperative concerns.  Proposed surgery: Hysteroscopy, Dilation and Curettage  Past Medical History:  Diagnosis Date   Complication of anesthesia    spinal heachache wants general anesthesia for CS   Depression    Dyspnea    GERD (gastroesophageal reflux disease)    Spinal headache    for four days   Past Surgical History:  Procedure Laterality Date   CESAREAN SECTION     CESAREAN SECTION N/A 06/18/2018   Procedure: CESAREAN SECTION;  Surgeon: Aletha Halim, MD;  Location: [facility];  Service: Obstetrics;  Laterality: N/A;   CESAREAN SECTION     CHOLECYSTECTOMY N/A 11/05/2019   Procedure: LAPAROSCOPIC CHOLECYSTECTOMY WITH INTRAOPERATIVE CHOLANGIOGRAM;  Surgeon: Coralie Keens, MD;  Location: [facility];  Service: General;  Laterality: N/A;   OB History  Gravida Para Term Preterm AB Living  4 3 3  0 1 3  SAB IAB Ectopic Multiple Live Births  1 0 0 0 3    # Outcome Date GA Lbr Len/2nd Weight Sex Delivery Anes PTL Lv  4 Term 06/18/18 [redacted]w[redacted]d  3070 g M CS-LTranv Gen  LIV  3 Term 01/07/13    F CS-LTranv   LIV  2 Term 07/19/10    F CS-LTranv   LIV  1 SAB 06/26/09     SAB       Obstetric Comments  Unable to tolerate epidural - Needs general anesthesia only  Patient denies any other pertinent gynecologic issues.   No current facility-administered medications on file prior to encounter.   Current Outpatient Medications on File Prior to Encounter  Medication Sig Dispense Refill   Drospirenone (SLYND) 4 MG TABS Take by mouth.     famotidine (PEPCID) 20 MG tablet Take 1  tablet (20 mg total) by mouth 2 (two) times daily. 180 tablet 0   fluticasone (FLONASE) 50 MCG/ACT nasal spray Place 1 spray into both nostrils daily. 18.2 mL 1   Allergies  Allergen Reactions   Other Other (See Comments)    Continuous Epidural Tray- caused a "Spinal headache" and "I could not stand"    Chalk Itching   Sprintec 28 [Norgestimate-Eth Estradiol] Other (See Comments)    Affected the libido negatively (low sexual drive)    Social History:   reports that she has never smoked. She has never used smokeless tobacco. She reports that she does not drink alcohol and does not use drugs.  Family History  Problem Relation Age of Onset   Cancer Maternal Aunt    Cancer Paternal Uncle    Hypertension Father    Breast cancer Paternal Aunt    Breast cancer Cousin     Review of Systems: Pertinent items noted in HPI and remainder of comprehensive ROS otherwise negative.  PHYSICAL EXAM: Blood pressure 105/70, pulse 72, temperature 98.2 F (36.8 C), temperature source Oral, resp. rate 15, height 5\' 5"  (1.651 m), weight 81.7 kg, last menstrual period 08/24/2021, SpO2 100 %. CONSTITUTIONAL: Well-developed, well-nourished female in no acute distress.  HENT:  Normocephalic, atraumatic, External right and left ear normal. Oropharynx is clear and moist EYES: Conjunctivae and EOM are normal. Pupils are equal,  round, and reactive to light. No scleral icterus.  NECK: Normal range of motion, supple, no masses SKIN: Skin is warm and dry. No rash noted. Not diaphoretic. No erythema. No pallor. NEUROLOGIC: Alert and oriented to person, place, and time. Normal reflexes, muscle tone coordination. No cranial nerve deficit noted. PSYCHIATRIC: Normal mood and affect. Normal behavior. Normal judgment and thought content. CARDIOVASCULAR: Normal heart rate noted, regular rhythm RESPIRATORY: Effort and breath sounds normal, no problems with respiration noted ABDOMEN: Soft, nontender, nondistended. PELVIC:  Deferred MUSCULOSKELETAL: Normal range of motion. No edema and no tenderness. 2+ distal pulses.  Labs: Results for orders placed or performed during the hospital encounter of 08/31/21 (from the past 336 hour(s))  CBC   Collection Time: 08/23/21 12:18 PM  Result Value Ref Range   WBC 6.5 4.0 - 10.5 K/uL   RBC 5.22 (H) 3.87 - 5.11 MIL/uL   Hemoglobin 12.0 12.0 - 15.0 g/dL   HCT 10.2 11.1 - 73.5 %   MCV 70.7 (L) 80.0 - 100.0 fL   MCH 23.0 (L) 26.0 - 34.0 pg   MCHC 32.5 30.0 - 36.0 g/dL   RDW 67.0 14.1 - 03.0 %   Platelets 285 150 - 400 K/uL   nRBC 0.0 0.0 - 0.2 %  Pregnancy, urine POC   Collection Time: 08/31/21 11:46 AM  Result Value Ref Range   Preg Test, Ur NEGATIVE NEGATIVE    Imaging Studies:  MM 3D SCREEN BREAST BILATERAL  Result Date: 06/08/2021 CLINICAL DATA:  Screening. EXAM: DIGITAL SCREENING BILATERAL MAMMOGRAM WITH TOMOSYNTHESIS AND CAD TECHNIQUE: Bilateral screening digital craniocaudal and mediolateral oblique mammograms were obtained. Bilateral screening digital breast tomosynthesis was performed. The images were evaluated with computer-aided detection. COMPARISON:  Previous exam(s). ACR Breast Density Category c: The breast tissue is heterogeneously dense, which may obscure small masses. FINDINGS: There are no findings suspicious for malignancy. IMPRESSION: No mammographic evidence of malignancy. A result letter of this screening mammogram will be mailed directly to the patient. RECOMMENDATION: Screening mammogram in one year. (Code:SM-B-01Y) BI-RADS CATEGORY  1: Negative. Electronically Signed   By: Sherian Rein M.D.   On: 06/08/2021 00:33   US PELVIC COMPLETE WITH TRANSVAGINAL  Result Date: 07/27/2021 CLINICAL DATA:  Abnormal uterine bleeding.  Lower abdominal pain. EXAM: TRANSABDOMINAL AND TRANSVAGINAL ULTRASOUND OF PELVIS TECHNIQUE: Both transabdominal and transvaginal ultrasound examinations of the pelvis were performed. Transabdominal technique was performed for  global imaging of the pelvis including uterus, ovaries, adnexal regions, and pelvic cul-de-sac. It was necessary to proceed with endovaginal exam following the transabdominal exam to visualize the uterus, endometrium, and ovaries. COMPARISON:  MRI of the abdomen 10/24/2019. CT abdomen pelvis 10/24/2019. FINDINGS: Uterus Measurements: 7.9 x 4.2 x 4.6 cm = volume: 80.4 mL. Slightly prominent intrauterine vessel and or tiny fibroid noted. No prominent fibroids or mass is noted. Small nabothian cysts noted. Endometrium Thickness: 7.5 mm.  No focal abnormality visualized. Right ovary Measurements: 2.4 x 1.6 x 2.1 cm = volume: 4.3 mL. Normal appearance/no adnexal mass. Small follicles noted. Left ovary Measurements: 2.7 x 2.3 x 2.3 cm = volume: 7.3 mL. Normal appearance/no adnexal mass. Small follicles noted. Other findings No abnormal free fluid. IMPRESSION: No significant abnormality identified. Electronically Signed   By: Maisie Fus  Register M.D.   On: 07/27/2021 07:06    Assessment: Principal Problem:   Abnormal uterine bleeding (AUB) Active Problems:   Atypical endometrial cells on Papanicolaou smear    Plan: Patient will undergo surgical management with Hysteroscopy, Dilation and Curettage.  The risks of surgery were discussed in detail with the patient including but not limited to: bleeding which may require transfusion; infection which may require antibiotics; injury to uterus or surrounding organs; intrauterine scarring which may impair future fertility; need for additional procedures including laparotomy or laparoscopy or subsequent procedures secondary to abnormal pathology; and other postoperative/anesthesia complications. Likelihood of success in evaluating the patient's condition was discussed. Routine postoperative instructions will be reviewed with the patient and her family in detail after surgery.  The patient concurred with the proposed plan, giving informed written consent for the surgery.   Patient has been NPO since last night and she will remain NPO for procedure.  Anesthesia and OR aware.  To OR when ready.    Verita Schneiders, MD, [place] for [employer], [place]

## 2021-08-31 ENCOUNTER — Encounter (HOSPITAL_BASED_OUTPATIENT_CLINIC_OR_DEPARTMENT_OTHER)
Admission: RE | Disposition: A | Discharge status: Home / Self Care | Payer: Self-pay | Source: Ambulatory Visit | Attending: Obstetrics & Gynecology

## 2021-08-31 ENCOUNTER — Ambulatory Visit (HOSPITAL_BASED_OUTPATIENT_CLINIC_OR_DEPARTMENT_OTHER)
Admission: RE | Admit: 2021-08-31 | Discharge: 2021-08-31 | Disposition: A | Discharge status: Home / Self Care | Payer: Medicaid Other | Source: Ambulatory Visit | Attending: Obstetrics & Gynecology | Admitting: Obstetrics & Gynecology

## 2021-08-31 ENCOUNTER — Other Ambulatory Visit: Payer: Self-pay

## 2021-08-31 ENCOUNTER — Encounter (HOSPITAL_BASED_OUTPATIENT_CLINIC_OR_DEPARTMENT_OTHER): Payer: Self-pay | Admitting: Obstetrics & Gynecology

## 2021-08-31 ENCOUNTER — Ambulatory Visit (HOSPITAL_BASED_OUTPATIENT_CLINIC_OR_DEPARTMENT_OTHER): Payer: Medicaid Other | Admitting: Anesthesiology

## 2021-08-31 DIAGNOSIS — F419 Anxiety disorder, unspecified: Secondary | ICD-10-CM | POA: Diagnosis not present

## 2021-08-31 DIAGNOSIS — N939 Abnormal uterine and vaginal bleeding, unspecified: Secondary | ICD-10-CM

## 2021-08-31 DIAGNOSIS — Z79899 Other long term (current) drug therapy: Secondary | ICD-10-CM | POA: Insufficient documentation

## 2021-08-31 DIAGNOSIS — F32A Depression, unspecified: Secondary | ICD-10-CM | POA: Insufficient documentation

## 2021-08-31 DIAGNOSIS — R87619 Unspecified abnormal cytological findings in specimens from cervix uteri: Secondary | ICD-10-CM

## 2021-08-31 DIAGNOSIS — K219 Gastro-esophageal reflux disease without esophagitis: Secondary | ICD-10-CM | POA: Insufficient documentation

## 2021-08-31 HISTORY — PX: HYSTEROSCOPY WITH D & C: SHX1775

## 2021-08-31 LAB — POCT PREGNANCY, URINE: Preg Test, Ur: NEGATIVE

## 2021-08-31 SURGERY — DILATATION AND CURETTAGE /HYSTEROSCOPY
Anesthesia: General | Site: Vagina

## 2021-08-31 MED ORDER — OXYCODONE HCL 5 MG/5ML PO SOLN: 5.0000 mg | Freq: Once | ORAL | Status: DC | PRN

## 2021-08-31 MED ORDER — KETOROLAC TROMETHAMINE 30 MG/ML IJ SOLN: 30.0000 mg | Freq: Once | INTRAMUSCULAR | Status: DC | PRN

## 2021-08-31 MED ORDER — KETOROLAC TROMETHAMINE 30 MG/ML IJ SOLN
INTRAMUSCULAR | Status: AC
  Filled 2021-08-31: qty 1

## 2021-08-31 MED ORDER — BUPIVACAINE HCL (PF) 0.5 % IJ SOLN
INTRAMUSCULAR | Status: AC
  Filled 2021-08-31: qty 30

## 2021-08-31 MED ORDER — PROPOFOL 10 MG/ML IV BOLUS
INTRAVENOUS | Status: AC
  Filled 2021-08-31: qty 20

## 2021-08-31 MED ORDER — OXYCODONE HCL 5 MG PO TABS: 5.0000 mg | ORAL_TABLET | Freq: Once | ORAL | Status: DC | PRN

## 2021-08-31 MED ORDER — LIDOCAINE HCL (CARDIAC) PF 100 MG/5ML IV SOSY
PREFILLED_SYRINGE | INTRAVENOUS | Status: DC | PRN
  Administered 2021-08-31: 60 mg via INTRATRACHEAL

## 2021-08-31 MED ORDER — GABAPENTIN 300 MG PO CAPS
300.0000 mg | ORAL_CAPSULE | ORAL | Status: AC
  Administered 2021-08-31: 300 mg via ORAL

## 2021-08-31 MED ORDER — LACTATED RINGERS IV SOLN: INTRAVENOUS | Status: DC

## 2021-08-31 MED ORDER — OXYCODONE HCL 5 MG PO TABS
5.0000 mg | ORAL_TABLET | ORAL | 0 refills | Status: DC | PRN
Start: 2021-08-31 — End: 2021-09-22

## 2021-08-31 MED ORDER — PHEXXI 1.8-1-0.4 % VA GEL: VAGINAL | 11 refills | Status: DC

## 2021-08-31 MED ORDER — AMISULPRIDE (ANTIEMETIC) 5 MG/2ML IV SOLN: 10.0000 mg | Freq: Once | INTRAVENOUS | Status: DC | PRN

## 2021-08-31 MED ORDER — ONDANSETRON HCL 4 MG/2ML IJ SOLN
INTRAMUSCULAR | Status: DC | PRN
  Administered 2021-08-31: 4 mg via INTRAVENOUS

## 2021-08-31 MED ORDER — MIDAZOLAM HCL 2 MG/2ML IJ SOLN
INTRAMUSCULAR | Status: AC
  Filled 2021-08-31: qty 2

## 2021-08-31 MED ORDER — DOCUSATE SODIUM 100 MG PO CAPS: 100.0000 mg | ORAL_CAPSULE | Freq: Two times a day (BID) | ORAL | 2 refills | Status: DC | PRN

## 2021-08-31 MED ORDER — ONDANSETRON HCL 4 MG/2ML IJ SOLN
INTRAMUSCULAR | Status: AC
  Filled 2021-08-31: qty 2

## 2021-08-31 MED ORDER — FENTANYL CITRATE (PF) 100 MCG/2ML IJ SOLN
INTRAMUSCULAR | Status: AC
  Filled 2021-08-31: qty 2

## 2021-08-31 MED ORDER — DEXAMETHASONE SODIUM PHOSPHATE 10 MG/ML IJ SOLN
INTRAMUSCULAR | Status: DC | PRN
  Administered 2021-08-31: 4 mg via INTRAVENOUS

## 2021-08-31 MED ORDER — ACETAMINOPHEN 500 MG PO TABS
1000.0000 mg | ORAL_TABLET | ORAL | Status: AC
  Administered 2021-08-31: 1000 mg via ORAL

## 2021-08-31 MED ORDER — IBUPROFEN 600 MG PO TABS: 600.0000 mg | ORAL_TABLET | Freq: Four times a day (QID) | ORAL | 2 refills | Status: DC | PRN

## 2021-08-31 MED ORDER — BUPIVACAINE HCL (PF) 0.25 % IJ SOLN
INTRAMUSCULAR | Status: AC
  Filled 2021-08-31: qty 30

## 2021-08-31 MED ORDER — BUPIVACAINE HCL 0.5 % IJ SOLN
INTRAMUSCULAR | Status: DC | PRN
Start: 2021-08-31 — End: 2021-08-31
  Administered 2021-08-31: 30 mL

## 2021-08-31 MED ORDER — ACETAMINOPHEN 500 MG PO TABS
ORAL_TABLET | ORAL | Status: AC
  Filled 2021-08-31: qty 2

## 2021-08-31 MED ORDER — FENTANYL CITRATE (PF) 100 MCG/2ML IJ SOLN: 25.0000 ug | INTRAMUSCULAR | Status: DC | PRN

## 2021-08-31 MED ORDER — PROPOFOL 10 MG/ML IV BOLUS
INTRAVENOUS | Status: DC | PRN
  Administered 2021-08-31: 200 mg via INTRAVENOUS

## 2021-08-31 MED ORDER — GABAPENTIN 300 MG PO CAPS
ORAL_CAPSULE | ORAL | Status: AC
  Filled 2021-08-31: qty 1

## 2021-08-31 MED ORDER — FENTANYL CITRATE (PF) 100 MCG/2ML IJ SOLN
INTRAMUSCULAR | Status: DC | PRN
  Administered 2021-08-31: 50 ug via INTRAVENOUS

## 2021-08-31 MED ORDER — SILVER NITRATE-POT NITRATE 75-25 % EX MISC
CUTANEOUS | Status: DC | PRN
Start: 2021-08-31 — End: 2021-08-31
  Administered 2021-08-31: 2

## 2021-08-31 MED ORDER — KETOROLAC TROMETHAMINE 30 MG/ML IJ SOLN
INTRAMUSCULAR | Status: DC | PRN
  Administered 2021-08-31: 30 mg via INTRAVENOUS

## 2021-08-31 MED ORDER — MIDAZOLAM HCL 5 MG/5ML IJ SOLN
INTRAMUSCULAR | Status: DC | PRN
  Administered 2021-08-31: 2 mg via INTRAVENOUS

## 2021-08-31 MED ORDER — POVIDONE-IODINE 10 % EX SWAB
2.0000 "application " | Freq: Once | CUTANEOUS | Status: AC
  Administered 2021-08-31: 2 via TOPICAL

## 2021-08-31 SURGICAL SUPPLY — 14 items
BIPOLAR CUTTING LOOP 21FR (ELECTRODE)
CATH ROBINSON RED A/P 16FR (CATHETERS) ×2 IMPLANT
GAUZE 4X4 16PLY ~~LOC~~+RFID DBL (SPONGE) ×2 IMPLANT
GLOVE SURG LTX SZ7 (GLOVE) ×2 IMPLANT
GLOVE SURG UNDER POLY LF SZ7 (GLOVE) ×2 IMPLANT
GOWN STRL REUS W/ TWL LRG LVL3 (GOWN DISPOSABLE) ×2 IMPLANT
GOWN STRL REUS W/TWL LRG LVL3 (GOWN DISPOSABLE) ×4
KIT PROCEDURE FLUENT (KITS) ×2 IMPLANT
LOOP CUTTING BIPOLAR 21FR (ELECTRODE) IMPLANT
PACK VAGINAL MINOR WOMEN LF (CUSTOM PROCEDURE TRAY) ×2 IMPLANT
PAD OB MATERNITY 4.3X12.25 (PERSONAL CARE ITEMS) ×2 IMPLANT
PAD PREP 24X48 CUFFED NSTRL (MISCELLANEOUS) ×2 IMPLANT
SLEEVE SCD COMPRESS KNEE MED (STOCKING) ×2 IMPLANT
TOWEL GREEN STERILE FF (TOWEL DISPOSABLE) ×4 IMPLANT

## 2021-08-31 NOTE — Op Note (Signed)
PREOPERATIVE DIAGNOSES:  Abnormal uterine bleeding,  atypical endometrial cells on Papanicolaou smear, inability to do office biopsy due to anxiety ?POSTOPERATIVE DIAGNOSIS: The same ?PROCEDURE: Hysteroscopy, Dilation and Curettage. ?SURGEON:  Dr. Jaynie Collins ? ? ?INDICATIONS: 41 y.o. [id number]  here for scheduled surgical evaluation for the aforementioned diagnoses.   Risks of surgery were discussed with the patient including but not limited to: bleeding which may require transfusion; infection which may require antibiotics; injury to uterus or surrounding organs; intrauterine scarring which may impair future fertility; need for additional procedures including laparotomy or laparoscopy; and other postoperative/anesthesia complications. Written informed consent was obtained.   ? ?FINDINGS:  A 8 week size uterus.  Mildly diffuse proliferative endometrium.  Normal ostia bilaterally. No endometrial lesions noted. ? ?ANESTHESIA:   General, paracervical block with 30 ml of 0.5% Marcaine ?FLUID DEFICITS:  40 ml of NS ?ESTIMATED BLOOD LOSS:  40 ml ?SPECIMENS: Endometrial curettings sent to pathology ?COMPLICATIONS:  None immediate. ? ?PROCEDURE DETAILS:  The patient was then taken to the operating room where general anesthesia was administered and was found to be adequate.  After an adequate timeout was performed, she was placed in the dorsal lithotomy position and examined; then prepped and draped in the sterile manner.   Her bladder was catheterized for an unmeasured amount of clear, yellow urine. A speculum was then placed in the patient's vagina and a single tooth tenaculum was applied to the anterior lip of the cervix.   A paracervical block using 30 ml of 0.5% Marcaine was administered.  The uterus was sounded to 8 cm and the cervix was dilated manually with metal dilators to accommodate the 5 mm diagnostic hysteroscope.  The hysteroscope was then inserted under direct visualization using NS as a suspension medium.   The uterine cavity was carefully examined with the findings as noted above.   After further careful visualization of the uterine cavity, the hysteroscope was removed under direct visualization.  A sharp curettage was then performed to obtain a moderate amount of endometrial curettings.  The tenaculum was removed from the anterior lip of the cervix and the vaginal speculum was removed after noting good hemostasis.  The patient tolerated the procedure well and was taken to the recovery area awake, extubated and in stable condition. ? ?The patient will be discharged to home as per PACU criteria.  Routine postoperative instructions given.  She was prescribed Oxycodone, Ibuprofen and Colace.  She will follow up in the office in 2-3 weeks  for postoperative evaluation. ? ? ?Jaynie Collins, MD, FACOG ?Obstetrician Heritage manager, Faculty Practice ?Center for [employer], [facility] ? ? ?

## 2021-08-31 NOTE — Transfer of Care (Signed)
Immediate Anesthesia Transfer of Care Note ? ?Patient: Mabeline Varas Grimaldo ? ?Procedure(s) Performed: DILATATION AND CURETTAGE /HYSTEROSCOPY (Vagina ) ? ?Patient Location: PACU ? ?Anesthesia Type:General ? ?Level of Consciousness: drowsy and patient cooperative ? ?Airway & Oxygen Therapy: Patient Spontanous Breathing and Patient connected to face mask oxygen ? ?Post-op Assessment: Report given to RN and Post -op Vital signs reviewed and stable ? ?Post vital signs: Reviewed and stable ? ?Last Vitals:  ?Vitals Value Taken Time  ?BP 106/72 08/31/21 1334  ?Temp    ?Pulse 95 08/31/21 1336  ?Resp 15 08/31/21 1336  ?SpO2 100 % 08/31/21 1336  ?Vitals shown include unvalidated device data. ? ?Last Pain:  ?Vitals:  ? 08/31/21 1145  ?TempSrc: Oral  ?PainSc: 0-No pain  ?   ? ?Patients Stated Pain Goal: 4 (08/31/21 1145) ? ?Complications: No notable events documented. ?

## 2021-08-31 NOTE — Anesthesia Postprocedure Evaluation (Signed)
Anesthesia Post Note ? ?Patient: Roberta Reynolds ? ?Procedure(s) Performed: DILATATION AND CURETTAGE /HYSTEROSCOPY (Vagina ) ? ?  ? ?Patient location during evaluation: PACU ?Anesthesia Type: General ?Level of consciousness: awake ?Pain management: pain level controlled ?Vital Signs Assessment: post-procedure vital signs reviewed and stable ?Respiratory status: spontaneous breathing, nonlabored ventilation, respiratory function stable and patient connected to nasal cannula oxygen ?Cardiovascular status: blood pressure returned to baseline and stable ?Postop Assessment: no apparent nausea or vomiting ?Anesthetic complications: no ? ? ?No notable events documented. ? ?Last Vitals:  ?Vitals:  ? 08/31/21 1430 08/31/21 1505  ?BP: 102/73 105/70  ?Pulse: 69 74  ?Resp: 12 20  ?Temp:  36.6 ?C  ?SpO2: 96% 97%  ?  ?Last Pain:  ?Vitals:  ? 08/31/21 1505  ?TempSrc: Oral  ?PainSc: 0-No pain  ? ? ?  ?  ?  ?  ?  ?  ? ?Eula Jaster P Caspian Deleonardis ? ? ? ? ?

## 2021-08-31 NOTE — Discharge Instructions (Addendum)
Can have Tylenol after  4pm ?Can have Ibuprofen after 7pm ? ? ? ?Post Anesthesia Home Care Instructions ? ?Activity: ?Get plenty of rest for the remainder of the day. A responsible individual must stay with you for 24 hours following the procedure.  ?For the next 24 hours, DO NOT: ?-Drive a car ?-Advertising copywriter ?-Drink alcoholic beverages ?-Take any medication unless instructed by your physician ?-Make any legal decisions or sign important papers. ? ?Meals: ?Start with liquid foods such as gelatin or soup. Progress to regular foods as tolerated. Avoid greasy, spicy, heavy foods. If nausea and/or vomiting occur, drink only clear liquids until the nausea and/or vomiting subsides. Call your physician if vomiting continues. ? ?Special Instructions/Symptoms: ?Your throat may feel dry or sore from the anesthesia or the breathing tube placed in your throat during surgery. If this causes discomfort, gargle with warm salt water. The discomfort should disappear within 24 hours. ? ?If you had a scopolamine patch placed behind your ear for the management of post- operative nausea and/or vomiting: ? ?1. The medication in the patch is effective for 72 hours, after which it should be removed.  Wrap patch in a tissue and discard in the trash. Wash hands thoroughly with soap and water. ?2. You may remove the patch earlier than 72 hours if you experience unpleasant side effects which may include dry mouth, dizziness or visual disturbances. ?3. Avoid touching the patch. Wash your hands with soap and water after contact with the patch. ?    ?

## 2021-08-31 NOTE — Anesthesia Preprocedure Evaluation (Addendum)
Anesthesia Evaluation  ?Patient identified by MRN, date of birth, ID band ?Patient awake ? ? ? ?Reviewed: ?Allergy & Precautions, NPO status , Patient's Chart, lab work & pertinent test results ? ?Airway ?Mallampati: II ? ?TM Distance: >3 FB ?Neck ROM: Full ? ? ? Dental ?no notable dental hx. ? ?  ?Pulmonary ?neg pulmonary ROS,  ?  ?Pulmonary exam normal ?breath sounds clear to auscultation ? ? ? ? ? ? Cardiovascular ?negative cardio ROS ?Normal cardiovascular exam ?Rhythm:Regular Rate:Normal ? ? ?  ?Neuro/Psych ? Headaches, PSYCHIATRIC DISORDERS Depression   ? GI/Hepatic ?Neg liver ROS, GERD  Medicated and Controlled,  ?Endo/Other  ?negative endocrine ROS ? Renal/GU ?negative Renal ROS  ? ?  ?Musculoskeletal ?negative musculoskeletal ROS ?(+)  ? Abdominal ?  ?Peds ? Hematology ?negative hematology ROS ?(+)   ?Anesthesia Other Findings ?Atypical endometrial cells on pap smear, irregular bleeding ? Reproductive/Obstetrics ?hcg negative ? ?  ? ? ? ? ? ? ? ? ? ? ? ? ? ?  ?  ? ? ? ? ? ? ? ?Anesthesia Physical ?Anesthesia Plan ? ?ASA: 2 ? ?Anesthesia Plan: General  ? ?Post-op Pain Management:   ? ?Induction: Intravenous ? ?PONV Risk Score and Plan: 3 and Ondansetron, Dexamethasone, Midazolam and Treatment may vary due to age or medical condition ? ?Airway Management Planned: LMA ? ?Additional Equipment:  ? ?Intra-op Plan:  ? ?Post-operative Plan: Extubation in OR ? ?Informed Consent: I have reviewed the patients History and Physical, chart, labs and discussed the procedure including the risks, benefits and alternatives for the proposed anesthesia with the patient or authorized representative who has indicated his/her understanding and acceptance.  ? ? ? ?Dental advisory given ? ?Plan Discussed with: CRNA ? ?Anesthesia Plan Comments:   ? ? ? ? ? ? ?Anesthesia Quick Evaluation ? ?

## 2021-08-31 NOTE — Anesthesia Procedure Notes (Signed)
Procedure Name: LMA Insertion ?Date/Time: 08/31/2021 12:58 PM ?Performed by: Glory Buff, CRNA ?Pre-anesthesia Checklist: Patient identified, Emergency Drugs available, Suction available and Patient being monitored ?Patient Re-evaluated:Patient Re-evaluated prior to induction ?Oxygen Delivery Method: Circle system utilized ?Preoxygenation: Pre-oxygenation with 100% oxygen ?Induction Type: IV induction ?LMA: LMA inserted ?LMA Size: 4.0 ?Number of attempts: 1 ?Placement Confirmation: positive ETCO2 ?Tube secured with: Tape ?Dental Injury: Teeth and Oropharynx as per pre-operative assessment  ? ? ? ? ?

## 2021-08-31 NOTE — Progress Notes (Signed)
Patient reported having adverse effects to [facility] for contraception (low libido, depressed mood) and wants non hormonal contraception. Reviewed forms of nonhormonal conraception (barrier methods, copper IUD, Phexxi etc), she wants to try Phexxi.  This will be prescribed for her. ? ? ?Roberta Collins, MD, FACOG ?Obstetrician Heritage manager, Faculty Practice ?Center for [employer], [facility] ? ?

## 2021-09-01 ENCOUNTER — Encounter (HOSPITAL_BASED_OUTPATIENT_CLINIC_OR_DEPARTMENT_OTHER): Payer: Self-pay | Admitting: Obstetrics & Gynecology

## 2021-09-01 LAB — SURGICAL PATHOLOGY

## 2021-09-22 ENCOUNTER — Ambulatory Visit (INDEPENDENT_AMBULATORY_CARE_PROVIDER_SITE_OTHER): Payer: Medicaid Other | Admitting: Obstetrics & Gynecology

## 2021-09-22 ENCOUNTER — Encounter: Payer: Self-pay | Admitting: Obstetrics & Gynecology

## 2021-09-22 ENCOUNTER — Encounter: Payer: Self-pay | Admitting: Nurse Practitioner

## 2021-09-22 ENCOUNTER — Other Ambulatory Visit: Payer: Self-pay | Admitting: Nurse Practitioner

## 2021-09-22 VITALS — BP 107/71 | Wt 181.4 lb

## 2021-09-22 DIAGNOSIS — R87619 Unspecified abnormal cytological findings in specimens from cervix uteri: Secondary | ICD-10-CM

## 2021-09-22 DIAGNOSIS — R059 Cough, unspecified: Secondary | ICD-10-CM

## 2021-09-22 DIAGNOSIS — N939 Abnormal uterine and vaginal bleeding, unspecified: Secondary | ICD-10-CM

## 2021-09-22 DIAGNOSIS — Z09 Encounter for follow-up examination after completed treatment for conditions other than malignant neoplasm: Secondary | ICD-10-CM | POA: Diagnosis not present

## 2021-09-22 NOTE — Progress Notes (Signed)
? ?GYNECOLOGY OFFICE VISIT NOTE ? ?History:  ? Roberta Reynolds is a 41 y.o. [phone] here today for follow up after recent Hysteroscopy, D&C on 08/31/2021 done for AUB, atypical endometrial cells on pap. Benign pathology.   She denies any abnormal vaginal discharge, bleeding, pelvic pain or other concerns.  ?  ?Past Medical History:  ?Diagnosis Date  ? Complication of anesthesia   ? spinal heachache wants general anesthesia for CS  ? Depression   ? Dyspnea   ? GERD (gastroesophageal reflux disease)   ? Hypercholesteremia 05/30/2021  ? Spinal headache   ? for four days  ? ? ?Past Surgical History:  ?Procedure Laterality Date  ? CESAREAN SECTION    ? CESAREAN SECTION N/A 06/18/2018  ? Procedure: CESAREAN SECTION;  Surgeon: [place] Bing, MD;  Location: [facility] BIRTHING SUIT[facility];  Service: Obstetrics;  Laterality: N/A;  ? CESAREAN SECTION    ? CHOLECYSTECTOMY N/A 11/05/2019  ? Procedure: LAPAROSCOPIC CHOLECYSTECTOMY WITH INTRAOPERATIVE CHOLANGIOGRAM;  Surgeon: [name], MD;  Location: [facility] ORS;  Service: General;  Laterality: N/A;  ? HYSTEROSCOPY WITH D & C N/A 08/31/2021  ? Procedure: DILATATION AND CURETTAGE /HYSTEROSCOPY;  Surgeon: Tereso Newcomer, MD;  Location: [facility];  Service: Gynecology;  Laterality: N/A;  ? ? ?The following portions of the patient's history were reviewed and updated as appropriate: allergies, current medications, past family history, past medical history, past social history, past surgical history and problem list.  ? ?Health Maintenance:  Atypical endometrial cells, negative HRHPV on 05/09/2021.  Normal mammogram on 06/07/2021.  ? ?Review of Systems:  ?Pertinent items noted in HPI and remainder of comprehensive ROS otherwise negative. ? ?Physical Exam:  ?BP 107/71   Wt 181 lb 6.4 oz (82.3 kg)   LMP 09/21/2021 (Exact Date)   BMI 30.19 kg/m?  ?CONSTITUTIONAL: Well-developed, well-nourished female in no acute distress.  ?HEENT:  Normocephalic, atraumatic. External right and  left ear normal. No scleral icterus.  ?NECK: Normal range of motion, supple, no masses noted on observation ?SKIN: No rash noted. Not diaphoretic. No erythema. No pallor. ?MUSCULOSKELETAL: Normal range of motion. No edema noted. ?NEUROLOGIC: Alert and oriented to person, place, and time. Normal muscle tone coordination. No cranial nerve deficit noted. ?PSYCHIATRIC: Normal mood and affect. Normal behavior. Normal judgment and thought content. ?CARDIOVASCULAR: Normal heart rate noted ?RESPIRATORY: Effort and breath sounds normal, no problems with respiration noted ?ABDOMEN: No masses noted. No other overt distention noted.   ?PELVIC: Deferred ? ?Labs and Imaging ?08/31/2021  ENDOMETRIAL, CURETTAGE:  ?Benign proliferative phase endometrium  ?Benign endocervical epithelium, endocervix and lower uterine segment  ?Negative for breakdown, polyp, hyperplasia and carcinoma  ? ?    ?Assessment and Plan:  ?  1. Postop check ?Doing well, reassured by benign endometrial pathology.  ?No other concerns. ? ?2. Abnormal uterine bleeding (AUB) ?Benign endometrial pathology.  ?No further abnormal bleeding since she has stopped OCPs.  Satisfied with Phexxi for contraception. ? ?3. Atypical endometrial cells on Papanicolaou smear ?Benign endocervical and endometrial pathology ?Repeat pap in one year. ? ?Routine preventative health maintenance measures emphasized. ?Please refer to After Visit Summary for other counseling recommendations.  ? ?Return in about 1 year (around 09/23/2022) for Pap smear.   ? ?I spent 20 minutes dedicated to the care of this patient including pre-visit review of records, face to face time with the patient discussing her conditions and treatments and post visit orders. ? ? ? ?Jaynie Collins, MD, FACOG ?Obstetrician Heritage manager, Faculty Practice ?  Center for [employer], [facility] ? ? ? ? ? ? ? ?

## 2021-09-23 ENCOUNTER — Encounter (HOSPITAL_COMMUNITY): Payer: Self-pay | Admitting: Emergency Medicine

## 2021-09-23 ENCOUNTER — Emergency Department (HOSPITAL_COMMUNITY)
Admission: EM | Admit: 2021-09-23 | Discharge: 2021-09-23 | Disposition: A | Discharge status: Home / Self Care | Payer: Medicaid Other | Attending: Emergency Medicine | Admitting: Emergency Medicine

## 2021-09-23 DIAGNOSIS — H1031 Unspecified acute conjunctivitis, right eye: Secondary | ICD-10-CM | POA: Insufficient documentation

## 2021-09-23 DIAGNOSIS — H5711 Ocular pain, right eye: Secondary | ICD-10-CM | POA: Diagnosis present

## 2021-09-23 MED ORDER — OFLOXACIN 0.3 % OP SOLN: 1.0000 [drp] | Freq: Four times a day (QID) | OPHTHALMIC | 0 refills | Status: DC

## 2021-09-23 NOTE — Discharge Instructions (Signed)
Use antibiotics 1 drop 4 times daily in the affected eye.  Conjunctivitis is extremely contagious, avoid touching the eye and make sure you are washing your hands frequently.  To help with crusting or drainage from the eye you can apply a warm or cool compress, make sure you are using a clean washcloth each time. ?

## 2021-09-23 NOTE — ED Triage Notes (Signed)
Patient complains of right eye discomfort and redness that started yesterday, reports three of her children were diagnosed with conjunctivitis and treated with antibiotics within the last ten days. Patient states she tried her children's eye drops with no relief. Patient is alert, oriented, ambulatory, and in no apparent distress at this time. ?

## 2021-09-23 NOTE — ED Provider Notes (Signed)
?[facility] EMERGENCY DEPARTMENT ?Provider Note ? ? ?CSN: [id number] ?Arrival date & time: 09/23/21  1045 ? ?  ? ?History ? ?Chief Complaint  ?Patient presents with  ? Conjunctivitis  ? ? ?Roberta Reynolds is a 41 y.o. female. ? ?Roberta Reynolds is a 41 y.o. female with a history of hyperlipidemia and GERD, who presents to the emergency department for right eye pain, redness and drainage since yesterday.  Patient reports all 3 of her children were diagnosed with conjunctivitis over the past 10 days and were treated with antibiotics.  She reports she first started experiencing symptoms yesterday and used some of the leftover antibiotics from her children but the antibiotics caused a lot of burning so she writes them out of her eye, today noted that she has had worsening crusting of the eye with continued yellowish drainage.  No change in vision.  Patient does not wear glasses or contacts but she is concerned that the antibiotic she was using from her children is not working for her.  She has been trying to wash her hands regularly but is concerned her husband may be developing symptoms as well. ? ?The history is provided by the patient.  ? ?  ? ?Home Medications ?Prior to Admission medications   ?Medication Sig Start Date End Date Taking? Authorizing Provider  ?ofloxacin (OCUFLOX) 0.3 % ophthalmic solution Place 1 drop into the right eye 4 (four) times daily. 09/23/21  Yes Dartha Lodge, PA-C  ?Lactic Ac-Citric Ac-Pot Bitart (PHEXXI) 1.8-1-0.4 % GEL Administer 1 applicator (5 g) intravaginally immediately before or up to 1 hour before Roberta Reynolds, The act of vaginal intercourse as needed. 08/31/21   Tereso Newcomer, MD  ?Multiple Vitamin (MULTI-VITAMIN) tablet Take 1 tablet by mouth daily.    [provider]  ?   ? ?Allergies    ?Other, Chalk, and Sprintec 28 [norgestimate-eth estradiol]   ? ?Review of Systems   ?Review of Systems  ?Constitutional:  Negative for chills and fever.  ?Eyes:  Positive for  photophobia, pain, discharge, redness and itching. Negative for visual disturbance.  ? ?Physical Exam ?Updated Vital Signs ?BP 118/77 (BP Location: Right Arm)   Pulse 76   Temp 97.9 ?F (36.6 ?C) (Oral)   Resp 16   LMP 09/21/2021 (Exact Date)   SpO2 100%  ?Physical Exam ?Vitals and nursing note reviewed.  ?Constitutional:   ?   General: She is not in acute distress. ?   Appearance: Normal appearance. She is well-developed. She is not ill-appearing or diaphoretic.  ?HENT:  ?   Head: Normocephalic and atraumatic.  ?Eyes:  ?   General:     ?   Right eye: No discharge.     ?   Left eye: No discharge.  ?   Comments: Right eye with very mild periorbital edema, no proptosis or induration, patient with injected sclera and purulent drainage and tearing, PERRLA, EOMI, left eye unremarkable, very mild photophobia, no consensual pain.  ?Pulmonary:  ?   Effort: Pulmonary effort is normal. No respiratory distress.  ?Skin: ?   General: Skin is warm and dry.  ?Neurological:  ?   Mental Status: She is alert and oriented to person, place, and time.  ?   Coordination: Coordination normal.  ?Psychiatric:     ?   Mood and Affect: Mood normal.     ?   Behavior: Behavior normal.  ? ? ?ED Results / Procedures / Treatments   ?Labs ?(all labs ordered  are listed, but only abnormal results are displayed) ?Labs Reviewed - No data to display ? ?EKG ?None ? ?Radiology ?No results found. ? ?Procedures ?Procedures  ? ? ?Medications Ordered in ED ?Medications - No data to display ? ?ED Course/ Medical Decision Making/ A&P ?  ?                        ?Roberta Reynolds presents with symptoms consistent with bacterial conjunctivitis.  Purulent discharge exam.  No entrapment or consensual photophobia.  Presentation non-concerning for iritis, corneal abrasions, or HSV.  No evidence of preseptal or orbital cellulitis.  Known exposure from her children but started using Polytrim drops at home without improvement, prescribed floxacillin drops.  Does not  wear contact lenses or glasses.  Personal hygiene and frequent handwashing discussed.  Patient advised to followup with ophthalmologist for reevaluation in several days..  Patient verbalizes understanding and is agreeable with discharge. ? ? ? ? ? ? ? ? ? ? ?Final Clinical Impression(s) / ED Diagnoses ?Final diagnoses:  ?Acute bacterial conjunctivitis of right eye  ? ? ?Rx / DC Orders ?ED Discharge Orders   ? ?      Ordered  ?  ofloxacin (OCUFLOX) 0.3 % ophthalmic solution  4 times daily       ? 09/23/21 1122  ? ?  ?  ? ?  ? ? ?  ?Jacqlyn Larsen, [place] ?09/23/21 1238 ? ?  ?Daleen Bo, MD ?09/24/21 1506 ? ?

## 2021-11-17 ENCOUNTER — Ambulatory Visit: Payer: Self-pay | Admitting: *Deleted

## 2021-11-17 ENCOUNTER — Ambulatory Visit: Payer: Medicaid Other | Admitting: Physician Assistant

## 2021-11-17 ENCOUNTER — Encounter: Payer: Self-pay | Admitting: Physician Assistant

## 2021-11-17 VITALS — BP 114/75 | HR 60 | Resp 18 | Ht 65.0 in | Wt 183.0 lb

## 2021-11-17 DIAGNOSIS — R5383 Other fatigue: Secondary | ICD-10-CM

## 2021-11-17 DIAGNOSIS — R002 Palpitations: Secondary | ICD-10-CM

## 2021-11-17 NOTE — Patient Instructions (Addendum)
I encourage you to increase your water intake, you should be drinking at least 64 ounces of water a day.  It is fine to use Tylenol or ibuprofen to help you with your headache.  We will call you with your lab results as soon as they are available.  Roney Jaffe, PA-C Physician Assistant [facility] [email or web address]   Perimenopause Perimenopause is the normal time of a woman's life when the levels of estrogen, the female hormone produced by the ovaries, begin to decrease. This leads to changes in menstrual periods before they stop completely (menopause). Perimenopause can begin 2-8 years before menopause. During perimenopause, the ovaries may or may not produce an egg and a woman can still become pregnant. What are the causes? This condition is caused by a natural change in hormone levels that happens as you get older. What increases the risk? This condition is more likely to start at an earlier age if you have certain medical conditions or have undergone treatments, including: A tumor of the pituitary gland in the brain. A disease that affects the ovaries and hormone production. Certain cancer treatments, such as chemotherapy or hormone therapy, or radiation therapy on the pelvis. Heavy smoking and excessive alcohol use. Family history of early menopause. What are the signs or symptoms? Perimenopausal changes affect each woman differently. Symptoms of this condition may include: Hot flashes. Irregular menstrual periods. Night sweats. Changes in feelings about sex. This could be a decrease in sex drive or an increased discomfort around your sexuality. Vaginal dryness. Headaches. Mood swings. Depression. Problems sleeping (insomnia). Memory problems or trouble concentrating. Irritability. Tiredness. Weight gain. Anxiety. Trouble getting pregnant. How is this diagnosed? This condition is diagnosed based on your medical  history, a physical exam, your age, your menstrual history, and your symptoms. Hormone tests may also be done. How is this treated? In some cases, no treatment is needed. You and your health care provider should make a decision together about whether treatment is necessary. Treatment will be based on your individual condition and preferences. Various treatments are available, such as: Menopausal hormone therapy (MHT). Medicines to treat specific symptoms. Acupuncture. Vitamin or herbal supplements. Before starting treatment, make sure to let your health care provider know if you have a personal or family history of: Heart disease. Breast cancer. Blood clots. Diabetes. Osteoporosis. Follow these instructions at home: Medicines Take over-the-counter and prescription medicines only as told by your health care provider. Take vitamin supplements only as told by your health care provider. Talk with your health care provider before starting any herbal supplements. Lifestyle  Do not use any products that contain nicotine or tobacco, such as cigarettes, e-cigarettes, and chewing tobacco. If you need help quitting, ask your health care provider. Get at least 30 minutes of physical activity on 5 or more days each week. Eat a balanced diet that includes fresh fruits and vegetables, whole grains, soybeans, eggs, lean meat, and low-fat dairy. Avoid alcoholic and caffeinated beverages, as well as spicy foods. This may help prevent hot flashes. Get 7-8 hours of sleep each night. Dress in layers that can be removed to help you manage hot flashes. Find ways to manage stress, such as deep breathing, meditation, or journaling. General instructions  Keep track of your menstrual periods, including: When they occur. How heavy they are and how long they last. How much time passes between periods. Keep track of your symptoms, noting when they start, how often you have them, and how long they last.  Use  vaginal lubricants or moisturizers to help with vaginal dryness and improve comfort during sex. You can still become pregnant if you are having irregular periods. Make sure you use contraception during perimenopause if you do not want to get pregnant. Keep all follow-up visits. This is important. This includes any group therapy or counseling. Contact a health care provider if: You have heavy vaginal bleeding or pass blood clots. Your period lasts more than 2 days longer than normal. Your periods are recurring sooner than 21 days. You bleed after having sex. You have pain during sex. Get help right away if you have: Chest pain, trouble breathing, or trouble talking. Severe depression. Pain when you urinate. Severe headaches. Vision problems. Summary Perimenopause is the time when a woman's body begins to move into menopause. This may happen naturally or as a result of other health problems or medical treatments. Perimenopause can begin 2-8 years before menopause, and it can last for several years. Perimenopausal symptoms can be managed through medicines, lifestyle changes, and complementary therapies such as acupuncture. This information is not intended to replace advice given to you by your health care provider. Make sure you discuss any questions you have with your health care provider. Document Revised: 11/27/2019 Document Reviewed: 11/27/2019 Elsevier Patient Education  2023 [employer].

## 2021-11-17 NOTE — Progress Notes (Signed)
Established Patient Office Visit  Subjective   Patient ID: Roberta Reynolds, female    DOB: [birth year]  Age: 41 y.o. MRN: [id number]  Chief Complaint  Patient presents with   Fatigue   Headache   increased thirst    States that she she had episodes of a feeling of heart racing starting 2 days ago in the late afternoon.  States that it was more prominent while she was laying down.  Denies chest pain, shortness of breath.  States that she has been having increased premenstrual symptoms over the past few months.  States that she notices that she gets a very massive headache when her menses begins, states that she also needs to lay down and sleep as well as take ibuprofen, states that this is very unusual for her.  States that she also has been having episodes of "low blood pressure", states that she does not check her blood pressure but feels dizzy and believes that that means her blood pressure is low.  States that she is taking a daily Centrum vitamin is drinking 2-3 bottles of water a day.  States that she leads a very active life.  States that she sleeps well.  States that her menses have been normal flow      11/17/2021   11:19 AM 05/30/2021    9:19 AM 05/09/2021   10:16 AM 06/02/2020    2:56 PM 01/20/2020    8:58 AM  Depression screen PHQ 2/9  Decreased Interest 2 0 0 0 0  Down, Depressed, Hopeless 0 0 0 1 0  PHQ - 2 Score 2 0 0 1 0  Altered sleeping 2  0 0 0  Tired, decreased energy 2  0 1 0  Change in appetite 1  0 0 0  Feeling bad or failure about yourself  0  0 0 0  Trouble concentrating 1  0 0 0  Moving slowly or fidgety/restless 0  0 0 0  Suicidal thoughts 0  0 0 0  PHQ-9 Score 8  0 2 0  Difficult doing work/chores Somewhat difficult  Not difficult at all        11/17/2021   11:20 AM 05/09/2021   10:17 AM 06/02/2020    2:56 PM 01/20/2020    8:59 AM  GAD 7 : Generalized Anxiety Score  Nervous, Anxious, on Edge 0 1 1 0  Control/stop worrying 0 1 0 1  Worry too much  - different things 0 1 0 1  Trouble relaxing 0 0 0 0  Restless 0 0 0 0  Easily annoyed or irritable 0 0 0 3  Afraid - awful might happen 0 0 0 0  Total GAD 7 Score 0 3 1 5   Anxiety Difficulty Somewhat difficult Not difficult at all          Past Medical History:  Diagnosis Date   Complication of anesthesia    spinal heachache wants general anesthesia for CS   Depression    Dyspnea    GERD (gastroesophageal reflux disease)    Hypercholesteremia 05/30/2021   Spinal headache    for four days   Social History   Socioeconomic History   Marital status: Married    Spouse name: Not on file   Number of children: 3   Years of education: 16   Highest education level: Not on file  Occupational History   Not on file  Tobacco Use   Smoking status: Never   Smokeless tobacco: Never  Vaping Use   Vaping Use: Never used  Substance and Sexual Activity   Alcohol use: No   Drug use: No   Sexual activity: Yes    Partners: Male    Birth control/protection: Pill  Other Topics Concern   Not on file  Social History Narrative   Not on file   Social Determinants of Health   Financial Resource Strain: Not on file  Food Insecurity: Not on file  Transportation Needs: Not on file  Physical Activity: Not on file  Stress: Not on file  Social Connections: Not on file  Intimate Partner Violence: Not on file   Family History  Problem Relation Age of Onset   Cancer Maternal Aunt    Cancer Paternal Uncle    Hypertension Father    Breast cancer Paternal Aunt    Breast cancer Cousin    Allergies  Allergen Reactions   Other Other (See Comments)    Continuous Epidural Tray- caused a "Spinal headache" and "I could not stand"    Chalk Itching   Sprintec 28 [Norgestimate-Eth Estradiol] Other (See Comments)    Affected the libido negatively (low sexual drive)      Review of Systems  Constitutional:  Positive for malaise/fatigue. Negative for chills and fever.  HENT: Negative.     Eyes: Negative.   Respiratory:  Negative for shortness of breath.   Cardiovascular:  Positive for palpitations. Negative for chest pain and leg swelling.  Gastrointestinal:  Negative for nausea and vomiting.  Genitourinary: Negative.   Musculoskeletal: Negative.   Skin: Negative.   Neurological:  Positive for dizziness and headaches.  Endo/Heme/Allergies: Negative.   Psychiatric/Behavioral: Negative.       Objective:     BP 114/75 (BP Location: Left Arm, Patient Position: Sitting, Cuff Size: Normal)   Pulse 60   Resp 18   Ht 5\' 5"  (1.651 m)   Wt 183 lb (83 kg)   LMP 11/15/2021   SpO2 98%   BMI 30.45 kg/m    Physical Exam Vitals and nursing note reviewed.  Constitutional:      Appearance: Normal appearance.  HENT:     Head: Normocephalic and atraumatic.     Right Ear: External ear normal.     Left Ear: External ear normal.     Nose: Nose normal.     Mouth/Throat:     Mouth: Mucous membranes are moist.     Pharynx: Oropharynx is clear.  Eyes:     Extraocular Movements: Extraocular movements intact.     Conjunctiva/sclera: Conjunctivae normal.     Pupils: Pupils are equal, round, and reactive to light.  Cardiovascular:     Rate and Rhythm: Normal rate and regular rhythm.     Pulses: Normal pulses.     Heart sounds: Normal heart sounds.  Pulmonary:     Effort: Pulmonary effort is normal.     Breath sounds: Normal breath sounds.  Musculoskeletal:        General: Normal range of motion.     Cervical back: Normal range of motion and neck supple.  Skin:    General: Skin is warm and dry.  Neurological:     General: No focal deficit present.     Mental Status: She is alert and oriented to person, place, and time.  Psychiatric:        Mood and Affect: Mood normal.        Behavior: Behavior normal.        Thought Content: Thought content  normal.        Judgment: Judgment normal.      Assessment & Plan:   Problem List Items Addressed This Visit   None Visit  Diagnoses     Other fatigue    -  Primary   Relevant Orders   CBC with Differential/Platelet   Iron, TIBC and Ferritin Panel   Thyroid Panel With TSH   Basic metabolic panel   Palpitations       Relevant Orders   CBC with Differential/Platelet   Iron, TIBC and Ferritin Panel     1. Palpitations Patient education given on supportive care, increase hydration.  Patient encouraged to check blood pressure and pulse during these episodes.  Red flags given for prompt reevaluation - CBC with Differential/Platelet - Iron, TIBC and Ferritin Panel  2. Other fatigue Patient does have history of low MCV, abnormal thyroid blood work.  Patient education given on perimenopause as well. - CBC with Differential/Platelet - Iron, TIBC and Ferritin Panel - Thyroid Panel With TSH - Basic metabolic panel   I have reviewed the patient's medical history (PMH, PSH, Social History, Family History, Medications, and allergies) , and have been updated if relevant. I spent 30 minutes reviewing chart and  face to face time with patient.   Return if symptoms worsen or fail to improve.    Kasandra Knudsenari S Mayers, PA-C

## 2021-11-17 NOTE — Telephone Encounter (Signed)
Reason for Disposition  [1] MODERATE weakness (i.e., interferes with work, school, normal activities) AND [2] cause unknown  (Exceptions: weakness with acute minor illness, or weakness from poor fluid intake)  Answer Assessment - Initial Assessment Questions 1. DESCRIPTION: "Describe how you are feeling."     Dizziness comes and goes, extreme fatigue 2. SEVERITY: "How bad is it?"  "Can you stand and walk?"   - MILD - Feels weak or tired, but does not interfere with work, school or normal activities   - [place] to stand and walk; weakness interferes with work, school, or normal activities   - SEVERE - Unable to stand or walk     moderate 3. ONSET:  "When did the weakness begin?"     2 days ago 4. CAUSE: "What do you think is causing the weakness?"     Not sure- heart was racing 2 nights ago 5. MEDICINES: "Have you recently started a new medicine or had a change in the amount of a medicine?"     No 6. OTHER SYMPTOMS: "Do you have any other symptoms?" (e.g., chest pain, fever, cough, SOB, vomiting, diarrhea, bleeding, other areas of pain)     2 days ago- palpitation, headache 7. PREGNANCY: "Is there any chance you are pregnant?" "When was your last menstrual period?"      No- now  Protocols used: Weakness (Generalized) and Fatigue-A-AH

## 2021-11-17 NOTE — Telephone Encounter (Signed)
  Chief Complaint: fatigue, dizziness Symptoms: extreme fatigue, dizziness, heart racing-2 nights ago Frequency: 2 days Pertinent Negatives: Patient denies chest pain, fever, cough, SOB, vomiting, diarrhea, bleeding, other areas of pain Disposition: [] ED /[] Urgent Care (no appt availability in office) / [] Appointment(In office/virtual)/ []  [facility] [facility]/ [] Home Care/ [] Refused Recommended Disposition /[x] [facility]/ []  Follow-up with PCP Additional Notes: No open appointment- unable to contact [facility]- advised mobile unit

## 2021-11-17 NOTE — Progress Notes (Signed)
Patient reports being at the gym 2 days ago and works out 3-4 days.  Patient has a great appetite and fatigue. Patient reports watching TV and heart began racing. Patient denied pain only racing. Patient reports increased anxiety due to these concerns. Patient reports this happens prior to her period which came on yesterday.

## 2021-11-17 NOTE — Telephone Encounter (Signed)
Patient has been seen by mobile unit. 

## 2021-11-18 LAB — CBC WITH DIFFERENTIAL/PLATELET
Basophils Absolute: 0 10*3/uL (ref 0.0–0.2)
Basos: 1 %
EOS (ABSOLUTE): 0.2 10*3/uL (ref 0.0–0.4)
Eos: 3 %
Hematocrit: 38.6 % (ref 34.0–46.6)
Hemoglobin: 12.3 g/dL (ref 11.1–15.9)
Immature Grans (Abs): 0 10*3/uL (ref 0.0–0.1)
Immature Granulocytes: 0 %
Lymphocytes Absolute: 2.8 10*3/uL (ref 0.7–3.1)
Lymphs: 41 %
MCH: 23.5 pg — ABNORMAL LOW (ref 26.6–33.0)
MCHC: 31.9 g/dL (ref 31.5–35.7)
MCV: 74 fL — ABNORMAL LOW (ref 79–97)
Monocytes Absolute: 0.3 10*3/uL (ref 0.1–0.9)
Monocytes: 5 %
Neutrophils Absolute: 3.4 10*3/uL (ref 1.4–7.0)
Neutrophils: 50 %
Platelets: 318 10*3/uL (ref 150–450)
RBC: 5.24 x10E6/uL (ref 3.77–5.28)
RDW: 15.2 % (ref 11.7–15.4)
WBC: 6.8 10*3/uL (ref 3.4–10.8)

## 2021-11-18 LAB — THYROID PANEL WITH TSH
Free Thyroxine Index: 2.4 (ref 1.2–4.9)
T3 Uptake Ratio: 27 % (ref 24–39)
T4, Total: 8.9 ug/dL (ref 4.5–12.0)
TSH: 0.648 u[IU]/mL (ref 0.450–4.500)

## 2021-11-18 LAB — IRON,TIBC AND FERRITIN PANEL
Ferritin: 59 ng/mL (ref 15–150)
Iron Saturation: 34 % (ref 15–55)
Iron: 92 ug/dL (ref 27–159)
Total Iron Binding Capacity: 268 ug/dL (ref 250–450)
UIBC: 176 ug/dL (ref 131–425)

## 2021-11-18 LAB — BASIC METABOLIC PANEL
BUN/Creatinine Ratio: 23 (ref 9–23)
BUN: 14 mg/dL (ref 6–24)
CO2: 22 mmol/L (ref 20–29)
Calcium: 9.3 mg/dL (ref 8.7–10.2)
Chloride: 101 mmol/L (ref 96–106)
Creatinine, Ser: 0.6 mg/dL (ref 0.57–1.00)
Glucose: 88 mg/dL (ref 70–99)
Potassium: 4.2 mmol/L (ref 3.5–5.2)
Sodium: 137 mmol/L (ref 134–144)
eGFR: 115 mL/min/{1.73_m2} (ref >59)

## 2021-11-24 ENCOUNTER — Telehealth: Payer: Self-pay

## 2021-11-24 NOTE — Telephone Encounter (Signed)
-----   Message from Roney Jaffe, [place] sent at 11/22/2021  9:53 AM EDT ----- Please call patient and let her know that her thyroid function and kidney function are within normal limits, her iron levels are also within normal limits.  She does not show signs of anemia.

## 2022-02-01 ENCOUNTER — Encounter (INDEPENDENT_AMBULATORY_CARE_PROVIDER_SITE_OTHER): Payer: Self-pay

## 2022-07-11 ENCOUNTER — Other Ambulatory Visit: Payer: Self-pay | Admitting: Nurse Practitioner

## 2022-07-11 DIAGNOSIS — Z1231 Encounter for screening mammogram for malignant neoplasm of breast: Secondary | ICD-10-CM

## 2022-07-14 ENCOUNTER — Ambulatory Visit
Admission: RE | Admit: 2022-07-14 | Discharge: 2022-07-14 | Disposition: A | Discharge status: Home / Self Care | Payer: Medicaid Other | Source: Ambulatory Visit | Attending: Nurse Practitioner | Admitting: Nurse Practitioner

## 2022-07-14 DIAGNOSIS — Z1231 Encounter for screening mammogram for malignant neoplasm of breast: Secondary | ICD-10-CM

## 2022-07-17 ENCOUNTER — Other Ambulatory Visit: Payer: Self-pay | Admitting: Nurse Practitioner

## 2022-07-17 DIAGNOSIS — R928 Other abnormal and inconclusive findings on diagnostic imaging of breast: Secondary | ICD-10-CM

## 2022-07-22 ENCOUNTER — Ambulatory Visit
Admission: RE | Admit: 2022-07-22 | Discharge: 2022-07-22 | Disposition: A | Discharge status: Home / Self Care | Payer: Medicaid Other | Source: Ambulatory Visit | Attending: Nurse Practitioner | Admitting: Nurse Practitioner

## 2022-07-22 ENCOUNTER — Other Ambulatory Visit: Payer: Self-pay | Admitting: Nurse Practitioner

## 2022-07-22 DIAGNOSIS — N6310 Unspecified lump in the right breast, unspecified quadrant: Secondary | ICD-10-CM

## 2022-07-22 DIAGNOSIS — R928 Other abnormal and inconclusive findings on diagnostic imaging of breast: Secondary | ICD-10-CM

## 2022-07-28 ENCOUNTER — Ambulatory Visit
Admission: RE | Admit: 2022-07-28 | Discharge: 2022-07-28 | Disposition: A | Discharge status: Home / Self Care | Payer: Medicaid Other | Source: Ambulatory Visit | Attending: Nurse Practitioner | Admitting: Nurse Practitioner

## 2022-07-28 DIAGNOSIS — N6310 Unspecified lump in the right breast, unspecified quadrant: Secondary | ICD-10-CM

## 2022-07-28 DIAGNOSIS — R928 Other abnormal and inconclusive findings on diagnostic imaging of breast: Secondary | ICD-10-CM

## 2022-07-28 HISTORY — PX: BREAST BIOPSY: SHX20

## 2022-08-03 ENCOUNTER — Emergency Department (HOSPITAL_COMMUNITY)
Admission: EM | Admit: 2022-08-03 | Discharge: 2022-08-03 | Disposition: A | Discharge status: Home / Self Care | Payer: Medicaid Other | Attending: Emergency Medicine | Admitting: Emergency Medicine

## 2022-08-03 ENCOUNTER — Encounter (HOSPITAL_COMMUNITY): Payer: Self-pay | Admitting: Pharmacy Technician

## 2022-08-03 ENCOUNTER — Emergency Department (HOSPITAL_COMMUNITY): Payer: Medicaid Other

## 2022-08-03 ENCOUNTER — Other Ambulatory Visit: Payer: Self-pay

## 2022-08-03 DIAGNOSIS — J020 Streptococcal pharyngitis: Secondary | ICD-10-CM | POA: Diagnosis not present

## 2022-08-03 DIAGNOSIS — Z1152 Encounter for screening for COVID-19: Secondary | ICD-10-CM | POA: Insufficient documentation

## 2022-08-03 DIAGNOSIS — R509 Fever, unspecified: Secondary | ICD-10-CM | POA: Diagnosis present

## 2022-08-03 LAB — RESP PANEL BY RT-PCR (RSV, FLU A&B, COVID)  RVPGX2
Influenza A by PCR: NEGATIVE
Influenza B by PCR: NEGATIVE
Resp Syncytial Virus by PCR: NEGATIVE
SARS Coronavirus 2 by RT PCR: NEGATIVE

## 2022-08-03 LAB — GROUP A STREP BY PCR: Group A Strep by PCR: DETECTED — AB

## 2022-08-03 MED ORDER — PENICILLIN G BENZATHINE 1200000 UNIT/2ML IM SUSY
1.2000 10*6.[IU] | PREFILLED_SYRINGE | Freq: Once | INTRAMUSCULAR | Status: AC
  Administered 2022-08-03: 1.2 10*6.[IU] via INTRAMUSCULAR
  Filled 2022-08-03: qty 2

## 2022-08-03 MED ORDER — LIDOCAINE VISCOUS HCL 2 % MT SOLN
15.0000 mL | Freq: Once | OROMUCOSAL | Status: AC
  Administered 2022-08-03: 15 mL via OROMUCOSAL
  Filled 2022-08-03: qty 15

## 2022-08-03 MED ORDER — DEXAMETHASONE SODIUM PHOSPHATE 10 MG/ML IJ SOLN
10.0000 mg | Freq: Once | INTRAMUSCULAR | Status: AC
  Administered 2022-08-03: 10 mg via INTRAMUSCULAR
  Filled 2022-08-03: qty 1

## 2022-08-03 NOTE — ED Provider Notes (Signed)
[facility] Provider Note   CSN: [id number] Arrival date & time: 08/03/22  2683     History  Chief Complaint  Patient presents with   Sore Throat    Roberta Reynolds is a 42 y.o. female.  With past medical history of GERD, hypercholesterolemia who presents to the emergency department with sore throat.  Patient states that over the past 5 to 6 days she has had sore throat, headache, body aches, productive cough, congestion and fever up to 103.  States that she has been using Tylenol and Motrin at home with minimal relief of her symptoms.  She does feel mildly short of breath but she is denying chest pain, palpitations.  She states that her family is also sick at home with similar symptoms.  Denies sinus pain or pressure, ear pain or drainage, nausea, vomiting or diarrhea.  HPI     Home Medications Prior to Admission medications   Medication Sig Start Date End Date Taking? Authorizing Provider  Lactic Ac-Citric Ac-Pot Bitart (PHEXXI) 1.8-1-0.4 % GEL Administer 1 applicator (5 g) intravaginally immediately before or up to 1 hour before [facility] act of vaginal intercourse as needed. 08/31/21   Osborne Oman, MD  Multiple Vitamin (MULTI-VITAMIN) tablet Take 1 tablet by mouth daily.    [provider]  ofloxacin (OCUFLOX) 0.3 % ophthalmic solution Place 1 drop into the right eye 4 (four) times daily. 09/23/21   Jacqlyn Larsen, PA-C      Allergies    Other, Chalk, and Sprintec 28 [norgestimate-eth estradiol]    Review of Systems   Review of Systems  Constitutional:  Positive for fever.  HENT:  Positive for congestion and sore throat.   Respiratory:  Positive for cough.   All other systems reviewed and are negative.   Physical Exam Updated Vital Signs BP 108/71 (BP Location: Right Arm)   Pulse (!) 103   Temp 99.3 F (37.4 C) (Oral)   Resp 18   LMP 07/27/2022   SpO2 97%  Physical Exam Vitals and nursing note reviewed.   Constitutional:      General: She is not in acute distress.    Appearance: Normal appearance. She is ill-appearing. She is not toxic-appearing.  HENT:     Head: Normocephalic and atraumatic.     Right Ear: Tympanic membrane normal.     Left Ear: Tympanic membrane normal.     Nose: Congestion present.     Mouth/Throat:     Mouth: Mucous membranes are moist.     Pharynx: Oropharyngeal exudate and posterior oropharyngeal erythema present.     Tonsils: Tonsillar exudate present. No tonsillar abscesses. 3+ on the right. 3+ on the left.  Eyes:     General: No scleral icterus.    Extraocular Movements: Extraocular movements intact.  Cardiovascular:     Rate and Rhythm: Normal rate and regular rhythm.     Pulses: Normal pulses.     Heart sounds: No murmur heard. Pulmonary:     Effort: Pulmonary effort is normal. No respiratory distress.     Breath sounds: Normal breath sounds. No stridor. No wheezing, rhonchi or rales.  Abdominal:     General: Bowel sounds are normal.     Palpations: Abdomen is soft.  Musculoskeletal:     Cervical back: Normal range of motion.  Lymphadenopathy:     Cervical: Cervical adenopathy present.  Skin:    General: Skin is warm and dry.  Capillary Refill: Capillary refill takes less than 2 seconds.  Neurological:     General: No focal deficit present.     Mental Status: She is alert and oriented to person, place, and time. Mental status is at baseline.  Psychiatric:        Mood and Affect: Mood normal.        Behavior: Behavior normal.        Thought Content: Thought content normal.        Judgment: Judgment normal.     ED Results / Procedures / Treatments   Labs (all labs ordered are listed, but only abnormal results are displayed) Labs Reviewed  GROUP A STREP BY PCR - Abnormal; Notable for the following components:      Result Value   Group A Strep by PCR DETECTED (*)    All other components within normal limits  RESP PANEL BY RT-PCR (RSV, FLU  A&B, COVID)  [id number]    EKG None  Radiology DG Chest 2 View  Result Date: 08/03/2022 CLINICAL DATA:  Cough, congestion, stuffiness, and headache for 5 days question pneumonia EXAM: CHEST - 2 VIEW COMPARISON:  None Available. FINDINGS: Normal heart size, mediastinal contours, and pulmonary vascularity. Calcified granuloma LEFT upper lobe. Lungs otherwise clear. No pleural effusion or pneumothorax. Bones unremarkable. Surgical clips RIGHT upper quadrant question cholecystectomy. IMPRESSION: No acute abnormalities. Electronically Signed   By: Lavonia Dana M.D.   On: 08/03/2022 11:23    Procedures Procedures   Medications Ordered in ED Medications  lidocaine (XYLOCAINE) 2 % viscous mouth solution 15 mL (15 mLs Mouth/Throat Given 08/03/22 1145)  dexamethasone (DECADRON) injection 10 mg (10 mg Intramuscular Given 08/03/22 1146)  penicillin g benzathine (BICILLIN LA) 1200000 UNIT/2ML injection 1.2 Million Units (1.2 Million Units Intramuscular Given 08/03/22 1146)    ED Course/ Medical Decision Making/ A&P                             Medical Decision Making Amount and/or Complexity of Data Reviewed Radiology: ordered.  Risk Prescription drug management.  Initial Impression and Ddx 42 year old female who presents to the emergency department with sore throat and viral symptoms.  She does have significant 3+ tonsillar swelling bilaterally, low-grade fever of 99 here.  Lungs are clear. Patient PMH that increases complexity of ED encounter: GERD Differential: Viral upper respiratory infection with cough to include COVID, flu, RSV.  Strep throat, PTA, RPA, Ludwig's angina  Interpretation of Diagnostics I independent reviewed and interpreted the labs as followed: Strep positive, respiratory panel negative  - I independently visualized the following imaging with scope of interpretation limited to determining acute life threatening conditions related to emergency care: Chest x-ray, which revealed no  acute findings  Patient Reassessment and Ultimate Disposition/Management 42 year old female who presents with sore throat and viral symptoms.  On exam she does have bilateral 3+ tonsillar swelling with exudates.  There is no evidence of PTA, RPA or Ludwig's angina.  She does not have hot potato voice, drooling, respiratory distress or stridor. Strep is positive here in the emergency department.  Will give her IM penicillin, IM Decadron for the swelling as well as viscous lidocaine for symptomatic improvement of her symptoms.  Outside of sore throat and fever her other symptoms are more viral in nature.  Her respiratory panel was negative.  Chest x-ray was negative for any pneumonia, pneumothorax or pleural effusion.  Symptomatic treatment of her symptoms as they gradually improve  over the next few days.  Given her return precautions for drooling, respiratory distress, inability tolerate liquids.  She verbalized understanding.  Safe for discharge at this time.  The patient has been appropriately medically screened and/or stabilized in the ED. I have low suspicion for any other emergent medical condition which would require further screening, evaluation or treatment in the ED or require inpatient management. At time of discharge the patient is hemodynamically stable and in no acute distress. I have discussed work-up results and diagnosis with patient and answered all questions. Patient is agreeable with discharge plan. We discussed strict return precautions for returning to the emergency department and they verbalized understanding.       Patient management required discussion with the following services or consulting groups:  None  Complexity of Problems Addressed Acute complicated illness or Injury  Additional Data Reviewed and Analyzed Further history obtained from: Past medical history and medications listed in the EMR, Prior ED visit notes, and Care Everywhere  Patient Encounter Risk  Assessment Prescriptions and SDOH impact on management  Final Clinical Impression(s) / ED Diagnoses Final diagnoses:  Strep pharyngitis    Rx / DC Orders ED Discharge Orders     None         Mickie Hillier, PA-C 08/03/22 1217    Valarie Merino, MD 08/03/22 1606

## 2022-08-03 NOTE — ED Notes (Signed)
Patient transported to X-ray 

## 2022-08-03 NOTE — ED Triage Notes (Addendum)
Pt here with fever, chills, body aches, fever, sore throat for the last 5 days. Reports husband and children are sick as well.

## 2022-08-03 NOTE — Discharge Instructions (Signed)
You were seen in the emergency department today for strep throat. We treated you with penicillin. You do not need any more antibiotics. Please continue to drink cold fluids which may be helpful and mixing honey into a warm tea drink may also be soothing. Cough drops that have menthol may help your sore throat. Please return for drooling, difficulty breathing or inability to swallow liquids.

## 2022-08-28 ENCOUNTER — Ambulatory Visit: Payer: Medicaid Other | Admitting: Obstetrics & Gynecology

## 2022-08-29 ENCOUNTER — Other Ambulatory Visit (HOSPITAL_COMMUNITY)
Admission: RE | Admit: 2022-08-29 | Discharge: 2022-08-29 | Disposition: A | Discharge status: Home / Self Care | Payer: Medicaid Other | Source: Ambulatory Visit | Attending: Obstetrics & Gynecology | Admitting: Obstetrics & Gynecology

## 2022-08-29 ENCOUNTER — Encounter: Payer: Self-pay | Admitting: Obstetrics & Gynecology

## 2022-08-29 ENCOUNTER — Ambulatory Visit (INDEPENDENT_AMBULATORY_CARE_PROVIDER_SITE_OTHER): Payer: Medicaid Other | Admitting: Obstetrics & Gynecology

## 2022-08-29 VITALS — BP 113/77 | HR 85 | Wt 191.0 lb

## 2022-08-29 DIAGNOSIS — R87619 Unspecified abnormal cytological findings in specimens from cervix uteri: Secondary | ICD-10-CM | POA: Insufficient documentation

## 2022-08-29 NOTE — Progress Notes (Signed)
GYNECOLOGY OFFICE VISIT NOTE  History:   Roberta Reynolds is a 42 y.o. [phone] here today for follow up after last pap smear showed atypical endometrial cells on Papanicolaou smear on 05/09/2021.  Had negative follow up evaluation including endometrial biopsy in 08/26/2021 (had to be done under anesthesia due to anxiety). She denies any abnormal vaginal discharge, bleeding, pelvic pain or other concerns.    Past Medical History:  Diagnosis Date   Complication of anesthesia    spinal heachache wants general anesthesia for CS   Depression    Dyspnea    GERD (gastroesophageal reflux disease)    Hypercholesteremia 05/30/2021   Spinal headache    for four days    Past Surgical History:  Procedure Laterality Date   BREAST BIOPSY Right 07/28/2022   [place] RT BREAST BX W LOC DEV 1ST LESION IMG BX SPEC US GUIDE 07/28/2022 [facility]   CESAREAN SECTION     CESAREAN SECTION N/A 06/18/2018   Procedure: CESAREAN SECTION;  Surgeon: Aletha Halim, MD;  Location: [facility];  Service: Obstetrics;  Laterality: N/A;   CESAREAN SECTION     CHOLECYSTECTOMY N/A 11/05/2019   Procedure: LAPAROSCOPIC CHOLECYSTECTOMY WITH INTRAOPERATIVE CHOLANGIOGRAM;  Surgeon: Coralie Keens, MD;  Location: [facility];  Service: General;  Laterality: N/A;   HYSTEROSCOPY WITH D & C N/A 08/31/2021   Procedure: DILATATION AND CURETTAGE /HYSTEROSCOPY;  Surgeon: Osborne Oman, MD;  Location: [facility];  Service: Gynecology;  Laterality: N/A;    The following portions of the patient's history were reviewed and updated as appropriate: allergies, current medications, past family history, past medical history, past social history, past surgical history and problem list.   Health Maintenance:  Benign breast imaging on  07/28/2022  Review of Systems:  Pertinent items noted in HPI and remainder of comprehensive ROS otherwise negative.  Physical Exam:  BP 113/77   Pulse 85   Wt 191 lb (86.6 kg)   LMP  08/22/2022 (Approximate)   BMI 31.78 kg/m  CONSTITUTIONAL: Well-developed, well-nourished female in no acute distress.  HEENT:  Normocephalic, atraumatic. External right and left ear normal. No scleral icterus.  NECK: Normal range of motion, supple, no masses noted on observation SKIN: No rash noted. Not diaphoretic. No erythema. No pallor. MUSCULOSKELETAL: Normal range of motion. No edema noted. NEUROLOGIC: Alert and oriented to person, place, and time. Normal muscle tone coordination. No cranial nerve deficit noted. PSYCHIATRIC: Normal mood and affect. Normal behavior. Normal judgment and thought content. CARDIOVASCULAR: Normal heart rate noted RESPIRATORY: Effort and breath sounds normal, no problems with respiration noted ABDOMEN: No masses noted. No other overt distention noted.   PELVIC: Normal appearing external genitalia with FGM I/II present; normal urethral meatus; normal appearing vaginal mucosa and cervix.  Pap smear done.No abnormal discharge noted.  Normal uterine size, no other palpable masses, no uterine or adnexal tenderness. Performed in the presence of a chaperone. Patient was anxious, exam was able to be done with Pedersen speculum. She tolerated this well.     Assessment and Plan:     1. Atypical endometrial cells on Papanicolaou smear on 05/09/2021 - Cytology - PAP done, will follow up results and manage accordingly. Routine preventative health maintenance measures emphasized. Please refer to After Visit Summary for other counseling recommendations.   Return for visits as recommended, any gynecologic concerns.    I spent 30 minutes dedicated to the care of this patient including pre-visit review of records, face to face time with the  patient discussing her conditions and treatments and post visit orders.    Verita Schneiders, MD, [facility] [employer], [place]

## 2022-09-01 LAB — CYTOLOGY - PAP
Comment: NEGATIVE
Diagnosis: NEGATIVE
Diagnosis: REACTIVE
High risk HPV: NEGATIVE

## 2022-09-21 ENCOUNTER — Other Ambulatory Visit: Payer: Self-pay | Admitting: Obstetrics & Gynecology

## 2022-09-26 ENCOUNTER — Encounter: Payer: Self-pay | Admitting: Nurse Practitioner

## 2022-10-01 ENCOUNTER — Other Ambulatory Visit: Payer: Self-pay | Admitting: Nurse Practitioner

## 2022-10-01 MED ORDER — CLINDAMYCIN PHOS-BENZOYL PEROX 1-5 % EX GEL: Freq: Two times a day (BID) | CUTANEOUS | 1 refills | Status: DC

## 2023-01-01 ENCOUNTER — Encounter: Payer: Self-pay | Admitting: Obstetrics & Gynecology

## 2023-01-10 ENCOUNTER — Other Ambulatory Visit (HOSPITAL_COMMUNITY)
Admission: RE | Admit: 2023-01-10 | Discharge: 2023-01-10 | Disposition: A | Discharge status: Home / Self Care | Payer: Medicaid Other | Source: Ambulatory Visit | Attending: Obstetrics & Gynecology | Admitting: Obstetrics & Gynecology

## 2023-01-10 ENCOUNTER — Ambulatory Visit (INDEPENDENT_AMBULATORY_CARE_PROVIDER_SITE_OTHER): Payer: Medicaid Other | Admitting: Obstetrics & Gynecology

## 2023-01-10 ENCOUNTER — Encounter: Payer: Self-pay | Admitting: Obstetrics & Gynecology

## 2023-01-10 VITALS — BP 112/71 | HR 77 | Wt 190.0 lb

## 2023-01-10 DIAGNOSIS — N760 Acute vaginitis: Secondary | ICD-10-CM | POA: Insufficient documentation

## 2023-01-10 DIAGNOSIS — B9689 Other specified bacterial agents as the cause of diseases classified elsewhere: Secondary | ICD-10-CM | POA: Diagnosis not present

## 2023-01-10 NOTE — Progress Notes (Signed)
GYNECOLOGY OFFICE VISIT NOTE  History:   Roberta Reynolds is a 42 y.o. [phone] here today for evaluation and management of itching in her vulva and vaginal regions for several days. Also reports vaginal odor.  Uses Phexxi for contraception, she states this still comes out for days after intercourse and causes burning. Helped a little by hydrocortisone cream.   She uses Summer Eve to wash her perineal region and using Moddes feminine wipes. Has bidet at home. Washes several times a day and changes underwear several times. She admits this is excessive, but does reports she washes her body three times in the shower too.  Does not want to use condoms or any hormonal modalities for birth control, no other barrier methods. She and her husband cannot do any sterilization procedures due to religious restrictions.   She denies any abnormal vaginal bleeding, pelvic pain or other concerns.    Past Medical History:  Diagnosis Date   Complication of anesthesia    spinal heachache wants general anesthesia for CS   Depression    Dyspnea    GERD (gastroesophageal reflux disease)    Hypercholesteremia 05/30/2021   Spinal headache    for four days    Past Surgical History:  Procedure Laterality Date   BREAST BIOPSY Right 07/28/2022   [place] RT BREAST BX W LOC DEV 1ST LESION IMG BX SPEC US GUIDE 07/28/2022 [facility]   CESAREAN SECTION     CESAREAN SECTION N/A 06/18/2018   Procedure: CESAREAN SECTION;  Surgeon: [place] Bing, MD;  Location: [facility] BIRTHING SUIT[facility];  Service: Obstetrics;  Laterality: N/A;   CESAREAN SECTION     CHOLECYSTECTOMY N/A 11/05/2019   Procedure: LAPAROSCOPIC CHOLECYSTECTOMY WITH INTRAOPERATIVE CHOLANGIOGRAM;  Surgeon: [name], MD;  Location: [facility];  Service: General;  Laterality: N/A;   HYSTEROSCOPY WITH D & C N/A 08/31/2021   Procedure: DILATATION AND CURETTAGE /HYSTEROSCOPY;  Surgeon: Tereso Newcomer, MD;  Location:  SURGERY CENTER;  Service: Gynecology;   Laterality: N/A;    The following portions of the patient's history were reviewed and updated as appropriate: allergies, current medications, past family history, past medical history, past social history, past surgical history and problem list.   Health Maintenance:  Normal pap and negative HRHPV on 3/5/202024.  Benign mammogram on 07/28/2022.   Review of Systems:  Pertinent items noted in HPI and remainder of comprehensive ROS otherwise negative.  Physical Exam:  BP 112/71   Pulse 77   Wt 190 lb (86.2 kg)   LMP 12/26/2022   BMI 31.62 kg/m  CONSTITUTIONAL: Well-developed, well-nourished female in no acute distress.  HEENT:  Normocephalic, atraumatic. External right and left ear normal. No scleral icterus.  NECK: Normal range of motion, supple, no masses noted on observation SKIN: No rash noted. Not diaphoretic. No erythema. No pallor. MUSCULOSKELETAL: Normal range of motion. No edema noted. NEUROLOGIC: Alert and oriented to person, place, and time. Normal muscle tone coordination. No cranial nerve deficit noted. PSYCHIATRIC: Normal mood and affect. Normal behavior. Normal judgment and thought content. CARDIOVASCULAR: Normal heart rate noted RESPIRATORY: Effort and breath sounds normal, no problems with respiration noted ABDOMEN: No masses noted. No other overt distention noted.   PELVIC: Normal appearing external genitalia; normal urethral meatus; normal appearing distal vaginal mucosa.  No erythema, rash or abnormal discharge noted.  Testing sample obtained. Performed in the presence of a chaperone    Assessment and Plan:     1. Vulvovaginitis Patient was told to consider stopping Phexxi  and consider alternative contraception, she is hesitant to do this.  Proper vulvar hygiene emphasized: discussed avoidance of perfumed soaps, wipes, detergents, lotions and any type of douches; in addition to wearing cotton underwear and no underwear at night.  Also recommended cleaning front to back,  voiding and cleaning up after intercourse with water.  Told to stop excessive washing of area as this causes more irritation. Vaginitis testing done, will follow up results and manage accordingly. - Cervicovaginal ancillary only( [place])  Please refer to After Visit Summary for other counseling recommendations.   Return for follow up as recommended.    I spent 30 minutes dedicated to the care of this patient including pre-visit review of records, face to face time with the patient discussing her conditions and treatments and post visit orders.    Jaynie Collins, MD, FACOG Obstetrician & Gynecologist, [facility] for [employer], [facility]

## 2023-01-12 LAB — CERVICOVAGINAL ANCILLARY ONLY
Bacterial Vaginitis (gardnerella): POSITIVE — AB
Candida Glabrata: NEGATIVE
Candida Vaginitis: NEGATIVE
Comment: NEGATIVE
Comment: NEGATIVE
Comment: NEGATIVE

## 2023-01-12 MED ORDER — METRONIDAZOLE 500 MG PO TABS: 500.0000 mg | ORAL_TABLET | Freq: Two times a day (BID) | ORAL | 0 refills | Status: AC

## 2023-01-12 NOTE — Addendum Note (Signed)
Addended by: Jaynie Collins A on: 01/12/2023 12:26 PM   Modules accepted: Orders

## 2023-02-19 ENCOUNTER — Ambulatory Visit: Payer: Medicaid Other | Attending: Nurse Practitioner | Admitting: Nurse Practitioner

## 2023-02-19 VITALS — BP 93/61 | HR 77 | Ht 65.0 in | Wt 189.6 lb

## 2023-02-19 DIAGNOSIS — G5603 Carpal tunnel syndrome, bilateral upper limbs: Secondary | ICD-10-CM | POA: Diagnosis not present

## 2023-02-19 DIAGNOSIS — L304 Erythema intertrigo: Secondary | ICD-10-CM | POA: Diagnosis not present

## 2023-02-19 DIAGNOSIS — L709 Acne, unspecified: Secondary | ICD-10-CM | POA: Diagnosis not present

## 2023-02-19 MED ORDER — CLOTRIMAZOLE-BETAMETHASONE 1-0.05 % EX CREA: 1.0000 | TOPICAL_CREAM | Freq: Every day | CUTANEOUS | 0 refills | Status: DC

## 2023-02-19 MED ORDER — BENZOYL PEROXIDE-ERYTHROMYCIN 5-3 % EX GEL: Freq: Two times a day (BID) | CUTANEOUS | 1 refills | Status: DC

## 2023-02-19 NOTE — Progress Notes (Unsigned)
Assessment & Plan:  Tosca was seen today for carpal tunnel.  Diagnoses and all orders for this visit:  Intertrigo -     clotrimazole-betamethasone (LOTRISONE) cream; Apply 1 Application topically daily. To groin area  Adult acne -     benzoyl peroxide-erythromycin (BENZAMYCIN) gel; Apply topically 2 (two) times daily.  Carpal Tunnel Recommend hand splints Minimal use of cell phone. Use stylus   Patient has been counseled on age-appropriate routine health concerns for screening and prevention. These are reviewed and up-to-date. Referrals have been placed accordingly. Immunizations are up-to-date or declined.    Subjective:   Chief Complaint  Patient presents with   Carpal Tunnel   HPI Roberta Reynolds 42 y.o. female presents to office today with symptoms of carpal tunnel.   Carpal Tunnel Syndrome: Patient presents for presents evaluation of pain in hands and possible carpal tunnel syndrome.  Onset of the symptoms was several weeks ago. Current symptoms include  pain, numbness and tingling in both thumbs radiating down into wrists . Inciting event/aggravating factors: repetitive activity: cell phone use  Patient's course of ZO:XWRUEAVW have progressed to a point and plateaued. Evaluation to date: none.  Treatment to date: none.    Rash  She notes itching in her vulva, groin and vaginal area along with malodorous vaginal discharge. She has seen GYN for this ongoing concern. PER GYN NOTE: 01-10-2023 Patient was told to consider stopping Phexxi and consider alternative contraception, she is hesitant to do this.  Proper vulvar hygiene emphasized: discussed avoidance of perfumed soaps, wipes, detergents, lotions and any type of douches; in addition to wearing cotton underwear and no underwear at night.  Also recommended cleaning front to back, voiding and cleaning up after intercourse with water.  Told to stop excessive washing of area as this causes more irritation. Vaginitis testing  done, will follow up results and manage accordingly.  Review of Systems  Constitutional:  Negative for fever, malaise/fatigue and weight loss.  HENT: Negative.  Negative for nosebleeds.   Eyes: Negative.  Negative for blurred vision, double vision and photophobia.  Respiratory: Negative.  Negative for cough and shortness of breath.   Cardiovascular: Negative.  Negative for chest pain, palpitations and leg swelling.  Gastrointestinal: Negative.  Negative for heartburn, nausea and vomiting.  Musculoskeletal:  Positive for joint pain. Negative for myalgias.  Skin:  Positive for itching and rash.  Neurological: Negative.  Negative for dizziness, focal weakness, seizures and headaches.  Psychiatric/Behavioral: Negative.  Negative for suicidal ideas.     Past Medical History:  Diagnosis Date   Complication of anesthesia    spinal heachache wants general anesthesia for CS   Depression    Dyspnea    GERD (gastroesophageal reflux disease)    Hypercholesteremia 05/30/2021   Spinal headache    for four days    Past Surgical History:  Procedure Laterality Date   BREAST BIOPSY Right 07/28/2022   [place] RT BREAST BX W LOC DEV 1ST LESION IMG BX SPEC US GUIDE 07/28/2022 [facility]MO[facility]   CESAREAN SECTION     CESAREAN SECTION N/A 06/18/2018   Procedure: CESAREAN SECTION;  Surgeon: [place] Bing, MD;  Location: [facility] BIRTHING SUIT[facility];  Service: Obstetrics;  Laterality: N/A;   CESAREAN SECTION     CHOLECYSTECTOMY N/A 11/05/2019   Procedure: LAPAROSCOPIC CHOLECYSTECTOMY WITH INTRAOPERATIVE CHOLANGIOGRAM;  Surgeon: [name], MD;  Location: [facility];  Service: General;  Laterality: N/A;   HYSTEROSCOPY WITH D & C N/A 08/31/2021   Procedure: DILATATION AND CURETTAGE /  HYSTEROSCOPY;  Surgeon: Tereso Newcomer, MD;  Location: [facility];  Service: Gynecology;  Laterality: N/A;    Family History  Problem Relation Age of Onset   Cancer Maternal Aunt    Cancer Paternal Uncle     Hypertension Father    Breast cancer Paternal Aunt    Breast cancer Cousin     Social History Reviewed with no changes to be made today.   Outpatient Medications Prior to Visit  Medication Sig Dispense Refill   Multiple Vitamin (MULTI-VITAMIN) tablet Take 1 tablet by mouth daily.     clindamycin-benzoyl peroxide (BENZACLIN) gel Apply topically 2 (two) times daily. (Patient not taking: Reported on 01/10/2023) 50 g 1   Lactic Ac-Citric Ac-Pot Bitart (PHEXXI) 1.8-1-0.4 % GEL PLEASE SEE ATTACHED FOR DETAILED DIRECTIONS (Patient not taking: Reported on 02/19/2023) 60 g 11   ofloxacin (OCUFLOX) 0.3 % ophthalmic solution Place 1 drop into the right eye 4 (four) times daily. (Patient not taking: Reported on 02/19/2023) 5 mL 0   No facility-administered medications prior to visit.    Allergies  Allergen Reactions   Other Other (See Comments)    Continuous Epidural Tray- caused a "Spinal headache" and "I could not stand"    Chalk Itching   Sprintec 28 [Norgestimate-Eth Estradiol] Other (See Comments)    Affected the libido negatively (low sexual drive)       Objective:    BP 93/61 (BP Location: Left Arm, Patient Position: Sitting, Cuff Size: Large)   Pulse 77   Ht 5\' 5"  (1.651 m)   Wt 189 lb 9.6 oz (86 kg)   LMP 02/16/2023   SpO2 100%   BMI 31.55 kg/m  Wt Readings from Last 3 Encounters:  02/19/23 189 lb 9.6 oz (86 kg)  01/10/23 190 lb (86.2 kg)  08/29/22 191 lb (86.6 kg)    Physical Exam Vitals and nursing note reviewed.  Constitutional:      Appearance: She is well-developed.  HENT:     Head: Normocephalic and atraumatic.  Cardiovascular:     Rate and Rhythm: Normal rate and regular rhythm.     Heart sounds: Normal heart sounds. No murmur heard.    No friction rub. No gallop.  Pulmonary:     Effort: Pulmonary effort is normal. No tachypnea or respiratory distress.     Breath sounds: Normal breath sounds. No decreased breath sounds, wheezing, rhonchi or rales.  Chest:      Chest wall: No tenderness.  Abdominal:     General: Bowel sounds are normal.     Palpations: Abdomen is soft.  Musculoskeletal:        General: Normal range of motion.     Cervical back: Normal range of motion.  Skin:    General: Skin is warm and dry.  Neurological:     Mental Status: She is alert and oriented to person, place, and time.     Coordination: Coordination normal.  Psychiatric:        Behavior: Behavior normal. Behavior is cooperative.        Thought Content: Thought content normal.        Judgment: Judgment normal.          Patient has been counseled extensively about nutrition and exercise as well as the importance of adherence with medications and regular follow-up. The patient was given clear instructions to go to ER or return to medical center if symptoms don't improve, worsen or new problems develop. The patient verbalized understanding.  Follow-up: Return in about 4 weeks (around 03/19/2023).   Claiborne Rigg, FNP-BC [facility] and [facility] [place], [place] [phone]   02/21/2023, 12:09 AM

## 2023-02-21 ENCOUNTER — Encounter: Payer: Self-pay | Admitting: Nurse Practitioner

## 2023-03-11 ENCOUNTER — Encounter: Payer: Self-pay | Admitting: Obstetrics & Gynecology

## 2023-03-20 ENCOUNTER — Ambulatory Visit: Payer: Medicaid Other | Attending: Nurse Practitioner | Admitting: Nurse Practitioner

## 2023-03-20 ENCOUNTER — Encounter: Payer: Self-pay | Admitting: Nurse Practitioner

## 2023-03-20 VITALS — BP 99/66 | HR 65 | Ht 65.0 in | Wt 191.0 lb

## 2023-03-20 DIAGNOSIS — G5603 Carpal tunnel syndrome, bilateral upper limbs: Secondary | ICD-10-CM | POA: Diagnosis not present

## 2023-03-20 DIAGNOSIS — R7989 Other specified abnormal findings of blood chemistry: Secondary | ICD-10-CM | POA: Diagnosis not present

## 2023-03-20 DIAGNOSIS — D649 Anemia, unspecified: Secondary | ICD-10-CM

## 2023-03-20 DIAGNOSIS — R7303 Prediabetes: Secondary | ICD-10-CM

## 2023-03-20 DIAGNOSIS — E785 Hyperlipidemia, unspecified: Secondary | ICD-10-CM | POA: Diagnosis not present

## 2023-03-20 NOTE — Progress Notes (Signed)
Assessment & Plan:  Roberta Reynolds was seen today for hand pain.  Diagnoses and all orders for this visit:  Carpal tunnel syndrome, bilateral Continue bilateral hand splints.  OTC NSAIDs  Low TSH level -     Cancel: Thyroid Panel With TSH -     Thyroid Panel With TSH; Future  Dyslipidemia, goal LDL below 100 -     Cancel: Lipid panel -     Lipid panel; Future  Prediabetes -     Cancel: Hemoglobin A1c -     Cancel: CMP14+EGFR -     CMP14+EGFR; Future -     Hemoglobin A1c; Future  Anemia, unspecified type -     Cancel: CBC with Differential/Platelet -     Cancel: Iron, TIBC and Ferritin Panel -     CBC with Differential/Platelet; Future -     Iron, TIBC and Ferritin Panel; Future    Patient has been counseled on age-appropriate routine health concerns for screening and prevention. These are reviewed and up-to-date. Referrals have been placed accordingly. Immunizations are up-to-date or declined.    Subjective:   Chief Complaint  Patient presents with   Hand Pain   HPI Roberta Reynolds 42 y.o. female presents to office today for follow up to bilateral carpal tunnel symptoms.  At her last appointment with me on 02-19-2023 she described the following:  Pain in bilateral hands.  Onset of the symptoms was several weeks ago. Current symptoms include  pain, numbness and tingling in both thumbs radiating down into wrists . Inciting event/aggravating factors: repetitive activity: cell phone use  Patient's course of WJ:XBJYNWGN have progressed to a point and plateaued. Evaluation to date: none.  Treatment to date: none.     After wearing the bilateral hands splints that were recommended she reports today that she has ben experiencing significant relief of pain in her hands.    Blood pressure is low normal today. She is asymptomatic however I have recommended that she monitor her blood pressure at home.  She states sometimes she feels like her blood pressure is low she will eat something  with sodium like cheese which makes her feel better however she does not take her blood pressure with her monitor so we do not have any numbers for review today BP Readings from Last 3 Encounters:  03/20/23 99/66  02/19/23 93/61  01/10/23 112/71     Review of Systems  Constitutional:  Negative for fever, malaise/fatigue and weight loss.  HENT: Negative.  Negative for nosebleeds.   Eyes: Negative.  Negative for blurred vision, double vision and photophobia.  Respiratory: Negative.  Negative for cough and shortness of breath.   Cardiovascular: Negative.  Negative for chest pain, palpitations and leg swelling.  Gastrointestinal: Negative.  Negative for heartburn, nausea and vomiting.  Musculoskeletal: Negative.  Negative for myalgias.  Neurological: Negative.  Negative for dizziness, focal weakness, seizures and headaches.  Psychiatric/Behavioral: Negative.  Negative for suicidal ideas.     Past Medical History:  Diagnosis Date   Complication of anesthesia    spinal heachache wants general anesthesia for CS   Depression    Dyspnea    GERD (gastroesophageal reflux disease)    Hypercholesteremia 05/30/2021   Spinal headache    for four days    Past Surgical History:  Procedure Laterality Date   BREAST BIOPSY Right 07/28/2022   [place] RT BREAST BX W LOC DEV 1ST LESION IMG BX SPEC US GUIDE 07/28/2022 [facility]MOGR[facility]   CESAREAN SECTION  CESAREAN SECTION N/A 06/18/2018   Procedure: CESAREAN SECTION;  Surgeon: [place] Bing, MD;  Location: [facility] BIRTHING SUITES;  Service: Obstetrics;  Laterality: N/A;   CESAREAN SECTION     CHOLECYSTECTOMY N/A 11/05/2019   Procedure: LAPAROSCOPIC CHOLECYSTECTOMY WITH INTRAOPERATIVE CHOLANGIOGRAM;  Surgeon: [name], MD;  Location: [facility];  Service: General;  Laterality: N/A;   HYSTEROSCOPY WITH D & C N/A 08/31/2021   Procedure: DILATATION AND CURETTAGE /HYSTEROSCOPY;  Surgeon: Tereso Newcomer, MD;  Location: [facility];  Service:  Gynecology;  Laterality: N/A;    Family History  Problem Relation Age of Onset   Cancer Maternal Aunt    Cancer Paternal Uncle    Hypertension Father    Breast cancer Paternal Aunt    Breast cancer Cousin     Social History Reviewed with no changes to be made today.   Outpatient Medications Prior to Visit  Medication Sig Dispense Refill   benzoyl peroxide-erythromycin (BENZAMYCIN) gel Apply topically 2 (two) times daily. 46.6 g 1   clotrimazole-betamethasone (LOTRISONE) cream Apply 1 Application topically daily. To groin area 60 g 0   Multiple Vitamin (MULTI-VITAMIN) tablet Take 1 tablet by mouth daily.     No facility-administered medications prior to visit.    Allergies  Allergen Reactions   Other Other (See Comments)    Continuous Epidural Tray- caused a "Spinal headache" and "I could not stand"    Chalk Itching   Sprintec 28 [Norgestimate-Eth Estradiol] Other (See Comments)    Affected the libido negatively (low sexual drive)       Objective:    BP 99/66 (BP Location: Left Arm, Patient Position: Sitting, Cuff Size: Normal)   Pulse 65   Ht 5\' 5"  (1.651 m)   Wt 191 lb (86.6 kg)   LMP 02/16/2023   SpO2 99%   BMI 31.78 kg/m  Wt Readings from Last 3 Encounters:  03/20/23 191 lb (86.6 kg)  02/19/23 189 lb 9.6 oz (86 kg)  01/10/23 190 lb (86.2 kg)    Physical Exam Vitals and nursing note reviewed.  Constitutional:      Appearance: She is well-developed.  HENT:     Head: Normocephalic and atraumatic.  Cardiovascular:     Rate and Rhythm: Normal rate and regular rhythm.     Heart sounds: Normal heart sounds. No murmur heard.    No friction rub. No gallop.  Pulmonary:     Effort: Pulmonary effort is normal. No tachypnea or respiratory distress.     Breath sounds: Normal breath sounds. No decreased breath sounds, wheezing, rhonchi or rales.  Chest:     Chest wall: No tenderness.  Abdominal:     General: Bowel sounds are normal.     Palpations: Abdomen is  soft.  Musculoskeletal:        General: Normal range of motion.     Cervical back: Normal range of motion.  Skin:    General: Skin is warm and dry.  Neurological:     Mental Status: She is alert and oriented to person, place, and time.     Coordination: Coordination normal.  Psychiatric:        Behavior: Behavior normal. Behavior is cooperative.        Thought Content: Thought content normal.        Judgment: Judgment normal.          Patient has been counseled extensively about nutrition and exercise as well as the importance of adherence with medications and regular follow-up.  The patient was given clear instructions to go to ER or return to medical center if symptoms don't improve, worsen or new problems develop. The patient verbalized understanding.   Follow-up: Return if symptoms worsen or fail to improve.   Claiborne Rigg, FNP-BC [facility] and [facility] [place], [place] [phone]   03/20/2023, 11:54 AM

## 2023-06-25 ENCOUNTER — Other Ambulatory Visit: Payer: Self-pay | Admitting: Nurse Practitioner

## 2023-06-25 DIAGNOSIS — L709 Acne, unspecified: Secondary | ICD-10-CM

## 2023-08-07 ENCOUNTER — Ambulatory Visit: Payer: Medicaid Other | Attending: Nurse Practitioner | Admitting: Nurse Practitioner

## 2023-08-07 ENCOUNTER — Encounter: Payer: Self-pay | Admitting: Nurse Practitioner

## 2023-08-07 VITALS — BP 97/64 | HR 73 | Ht 65.0 in | Wt 197.4 lb

## 2023-08-07 DIAGNOSIS — R350 Frequency of micturition: Secondary | ICD-10-CM | POA: Diagnosis not present

## 2023-08-07 DIAGNOSIS — E785 Hyperlipidemia, unspecified: Secondary | ICD-10-CM

## 2023-08-07 DIAGNOSIS — N3946 Mixed incontinence: Secondary | ICD-10-CM | POA: Diagnosis not present

## 2023-08-07 DIAGNOSIS — D649 Anemia, unspecified: Secondary | ICD-10-CM

## 2023-08-07 DIAGNOSIS — R7989 Other specified abnormal findings of blood chemistry: Secondary | ICD-10-CM

## 2023-08-07 DIAGNOSIS — M722 Plantar fascial fibromatosis: Secondary | ICD-10-CM | POA: Diagnosis not present

## 2023-08-07 DIAGNOSIS — K219 Gastro-esophageal reflux disease without esophagitis: Secondary | ICD-10-CM

## 2023-08-07 DIAGNOSIS — R7303 Prediabetes: Secondary | ICD-10-CM

## 2023-08-07 MED ORDER — OMEPRAZOLE 40 MG PO CPDR: 40.0000 mg | DELAYED_RELEASE_CAPSULE | Freq: Every day | ORAL | 1 refills | Status: DC

## 2023-08-07 MED ORDER — MELOXICAM 7.5 MG PO TABS: 7.5000 mg | ORAL_TABLET | Freq: Every day | ORAL | 1 refills | Status: DC

## 2023-08-07 NOTE — Progress Notes (Signed)
Assessment & Plan:  Roberta Reynolds was seen today for foot pain, ear pain and urinary incontinence.  Diagnoses and all orders for this visit:  Frequent urination -     Urinalysis complete Check Urinalysis Future Patient give instructions to return in 2-3 days to complete Urinalysis   GERD without esophagitis -     omeprazole (PRILOSEC) 40 MG capsule; Take 1 capsule (40 mg total) by mouth daily. Decrease spicy food, and caffeine Educational material provided on GERD  Plantar fasciitis, right -     Ambulatory referral to Podiatry -     meloxicam (MOBIC) 7.5 MG tablet; Take 1 tablet (7.5 mg total) by mouth daily. With food Referral to Podiatry Educational material provided on plantar fascitis  Mixed incontinence urge and stress -     Ambulatory referral to Physical Therapy Pelvic training Decrease intake of liquids prior to going to bed    Low TSH level -     Thyroid Panel With TSH Will contact with lab results   Dyslipidemia, goal LDL below 100 -     Lipid panel  Anemia, unspecified type -     Iron, TIBC and Ferritin Panel -     CBC with Differential/Platelet  Prediabetes -     Hemoglobin A1c -     CMP14+EGFR Continue exercise by walking  Decrease carbohydrates, highly processed foods, and sugary foods.     Patient has been counseled on age-appropriate routine health concerns for screening and prevention. These are reviewed and up-to-date. Referrals have been placed accordingly. Immunizations are up-to-date or declined.    Subjective:   Chief Complaint  Patient presents with   Foot Pain    Right bottom of heel pain   Ear Pain    Left ear pain.   Urinary Incontinence    Loss of urine invoulntary     Roberta Reynolds 43 y.o. female presents to office today for complaints of left ear discomfort. Denies having recent cold or respiratory illness. States she is having frequent urination and urinates when she walks or coughs if bladder is full. Denies dysuria or burning.  Reports feeling thirsty and drinking more fluids than usually. States she feels like food is coming back up her throat after she eats. Reports having reflux in the past and was on medication. Having right heel pain when she stands and walks. Unable to walk with her barefoot. Denies taking OTC meds for pain.      Review of Systems  Constitutional: Negative.   HENT:  Positive for ear pain.   Eyes: Negative.   Respiratory: Negative.    Cardiovascular: Negative.   Gastrointestinal:  Positive for heartburn.  Genitourinary:  Positive for frequency and urgency. Negative for dysuria.  Musculoskeletal: Negative.   Skin: Negative.   Neurological: Negative.   Endo/Heme/Allergies: Negative.   Psychiatric/Behavioral: Negative.      Past Medical History:  Diagnosis Date   Complication of anesthesia    spinal heachache wants general anesthesia for CS   Depression    Dyspnea    GERD (gastroesophageal reflux disease)    Hypercholesteremia 05/30/2021   Spinal headache    for four days    Past Surgical History:  Procedure Laterality Date   BREAST BIOPSY Right 07/28/2022   [place] RT BREAST BX W LOC DEV 1ST LESION IMG BX SPEC US GUIDE 07/28/2022 [facility]   CESAREAN SECTION     CESAREAN SECTION N/A 06/18/2018   Procedure: CESAREAN SECTION;  Surgeon: [place] Underwood Bing, MD;  Location:  [facility];  Service: Obstetrics;  Laterality: N/A;   CESAREAN SECTION     CHOLECYSTECTOMY N/A 11/05/2019   Procedure: LAPAROSCOPIC CHOLECYSTECTOMY WITH INTRAOPERATIVE CHOLANGIOGRAM;  Surgeon: [name], MD;  Location: [facility];  Service: General;  Laterality: N/A;   HYSTEROSCOPY WITH D & C N/A 08/31/2021   Procedure: DILATATION AND CURETTAGE /HYSTEROSCOPY;  Surgeon: Tereso Newcomer, MD;  Location: [facility];  Service: Gynecology;  Laterality: N/A;    Family History  Problem Relation Age of Onset   Cancer Maternal Aunt    Cancer Paternal Uncle    Hypertension Father    Breast  cancer Paternal Aunt    Breast cancer Cousin     Social History Reviewed with no changes to be made today.   Outpatient Medications Prior to Visit  Medication Sig Dispense Refill   Multiple Vitamin (MULTI-VITAMIN) tablet Take 1 tablet by mouth daily.     PHEXXI 1.8-1-0.4 % GEL PLEASE SEE ATTACHED FOR DETAILED DIRECTIONS     benzoyl peroxide-erythromycin (BENZAMYCIN) gel APPLY TOPICALLY TWICE A DAY (Patient not taking: Reported on 08/07/2023) 46.6 g 0   clotrimazole-betamethasone (LOTRISONE) cream Apply 1 Application topically daily. To groin area (Patient not taking: Reported on 08/07/2023) 60 g 0   No facility-administered medications prior to visit.    Allergies  Allergen Reactions   Other Other (See Comments)    Continuous Epidural Tray- caused a "Spinal headache" and "I could not stand"    Chalk Itching   Sprintec 28 [Norgestimate-Eth Estradiol] Other (See Comments)    Affected the libido negatively (low sexual drive)       Objective:    BP 97/64 (BP Location: Left Arm, Patient Position: Sitting, Cuff Size: Large)   Pulse 73   Ht 5\' 5"  (1.651 m)   Wt 197 lb 6.4 oz (89.5 kg)   LMP 08/05/2023 (Exact Date)   BMI 32.85 kg/m  Wt Readings from Last 3 Encounters:  08/07/23 197 lb 6.4 oz (89.5 kg)  03/20/23 191 lb (86.6 kg)  02/19/23 189 lb 9.6 oz (86 kg)   BP Readings from Last 3 Encounters:  08/07/23 97/64  03/20/23 99/66  02/19/23 93/61    .Marland Kitchen Physical Exam Vitals and nursing note reviewed.  Constitutional:      Appearance: Normal appearance.  HENT:     Head: Normocephalic.     Right Ear: Hearing, tympanic membrane, ear canal and external ear normal.     Left Ear: Hearing, tympanic membrane, ear canal and external ear normal.     Nose: Nose normal.     Mouth/Throat:     Lips: Pink.     Mouth: Mucous membranes are moist.     Pharynx: Oropharynx is clear. No postnasal drip.  Eyes:     General: Lids are normal.  Cardiovascular:     Rate and Rhythm: Normal rate  and regular rhythm.     Pulses:          Dorsalis pedis pulses are 2+ on the right side and 2+ on the left side.       Posterior tibial pulses are 2+ on the right side and 2+ on the left side.  Pulmonary:     Effort: Pulmonary effort is normal.     Breath sounds: Normal breath sounds.  Abdominal:     General: Bowel sounds are normal.     Palpations: Abdomen is soft.  Musculoskeletal:     Cervical back: Full passive range of motion without pain and  normal range of motion.     Right foot: Normal range of motion. No deformity.     Left foot: Normal range of motion. No deformity.  Feet:     Right foot:     Skin integrity: Skin integrity normal.     Left foot:     Skin integrity: Skin integrity normal.  Skin:    General: Skin is warm and dry.  Neurological:     Mental Status: She is alert and oriented to person, place, and time.     Gait: Gait is intact.  Psychiatric:        Attention and Perception: Attention normal.        Mood and Affect: Mood normal.        Speech: Speech normal.        Behavior: Behavior is cooperative.        Thought Content: Thought content normal.          Patient has been counseled extensively about nutrition and exercise as well as the importance of adherence with medications and regular follow-up. The patient was given clear instructions to go to ER or return to medical center if symptoms don't improve, worsen or new problems develop. The patient verbalized understanding.   Follow-up: Return if symptoms worsen or fail to improve.   Joette Catching, BSN RN-FNP student [facility] [place], [place] [phone]   08/07/2023, 3:00 PM

## 2023-08-07 NOTE — Progress Notes (Signed)
I have seen and examined this patient with the advanced practice provider STUDENT and agree with the above note .

## 2023-08-08 LAB — CMP14+EGFR
ALT: 15 [IU]/L (ref 0–32)
AST: 16 [IU]/L (ref 0–40)
Albumin: 4.5 g/dL (ref 3.9–4.9)
Alkaline Phosphatase: 51 [IU]/L (ref 44–121)
BUN/Creatinine Ratio: 16 (ref 9–23)
BUN: 9 mg/dL (ref 6–24)
Bilirubin Total: 0.4 mg/dL (ref 0.0–1.2)
CO2: 21 mmol/L (ref 20–29)
Calcium: 9.6 mg/dL (ref 8.7–10.2)
Chloride: 101 mmol/L (ref 96–106)
Creatinine, Ser: 0.57 mg/dL (ref 0.57–1.00)
Globulin, Total: 2.6 g/dL (ref 1.5–4.5)
Glucose: 72 mg/dL (ref 70–99)
Potassium: 4.4 mmol/L (ref 3.5–5.2)
Sodium: 140 mmol/L (ref 134–144)
Total Protein: 7.1 g/dL (ref 6.0–8.5)
eGFR: 115 mL/min/{1.73_m2} (ref >59)

## 2023-08-08 LAB — LIPID PANEL
Chol/HDL Ratio: 4.5 {ratio} — ABNORMAL HIGH (ref 0.0–4.4)
Cholesterol, Total: 227 mg/dL — ABNORMAL HIGH (ref 100–199)
HDL: 50 mg/dL (ref >39)
LDL Chol Calc (NIH): 150 mg/dL — ABNORMAL HIGH (ref 0–99)
Triglycerides: 148 mg/dL (ref 0–149)
VLDL Cholesterol Cal: 27 mg/dL (ref 5–40)

## 2023-08-08 LAB — HEMOGLOBIN A1C
Est. average glucose Bld gHb Est-mCnc: 123 mg/dL
Hgb A1c MFr Bld: 5.9 % — ABNORMAL HIGH (ref 4.8–5.6)

## 2023-08-08 LAB — CBC WITH DIFFERENTIAL/PLATELET
Basophils Absolute: 0 10*3/uL (ref 0.0–0.2)
Basos: 1 %
EOS (ABSOLUTE): 0.2 10*3/uL (ref 0.0–0.4)
Eos: 3 %
Hematocrit: 40.5 % (ref 34.0–46.6)
Hemoglobin: 12.7 g/dL (ref 11.1–15.9)
Immature Grans (Abs): 0 10*3/uL (ref 0.0–0.1)
Immature Granulocytes: 0 %
Lymphocytes Absolute: 3 10*3/uL (ref 0.7–3.1)
Lymphs: 41 %
MCH: 23 pg — ABNORMAL LOW (ref 26.6–33.0)
MCHC: 31.4 g/dL — ABNORMAL LOW (ref 31.5–35.7)
MCV: 73 fL — ABNORMAL LOW (ref 79–97)
Monocytes Absolute: 0.4 10*3/uL (ref 0.1–0.9)
Monocytes: 6 %
Neutrophils Absolute: 3.7 10*3/uL (ref 1.4–7.0)
Neutrophils: 49 %
Platelets: 344 10*3/uL (ref 150–450)
RBC: 5.52 x10E6/uL — ABNORMAL HIGH (ref 3.77–5.28)
RDW: 14.8 % (ref 11.7–15.4)
WBC: 7.4 10*3/uL (ref 3.4–10.8)

## 2023-08-08 LAB — THYROID PANEL WITH TSH
Free Thyroxine Index: 2.3 (ref 1.2–4.9)
T3 Uptake Ratio: 27 % (ref 24–39)
T4, Total: 8.4 ug/dL (ref 4.5–12.0)
TSH: 0.695 u[IU]/mL (ref 0.450–4.500)

## 2023-08-08 LAB — IRON,TIBC AND FERRITIN PANEL
Ferritin: 79 ng/mL (ref 15–150)
Iron Saturation: 43 % (ref 15–55)
Iron: 112 ug/dL (ref 27–159)
Total Iron Binding Capacity: 259 ug/dL (ref 250–450)
UIBC: 147 ug/dL (ref 131–425)

## 2023-08-09 ENCOUNTER — Encounter: Payer: Self-pay | Admitting: Nurse Practitioner

## 2023-08-12 ENCOUNTER — Encounter: Payer: Self-pay | Admitting: Nurse Practitioner

## 2023-08-15 ENCOUNTER — Ambulatory Visit: Payer: Medicaid Other | Admitting: Nurse Practitioner

## 2023-08-15 ENCOUNTER — Ambulatory Visit (INDEPENDENT_AMBULATORY_CARE_PROVIDER_SITE_OTHER): Payer: Medicaid Other | Admitting: Podiatry

## 2023-08-15 ENCOUNTER — Ambulatory Visit (INDEPENDENT_AMBULATORY_CARE_PROVIDER_SITE_OTHER): Payer: Medicaid Other

## 2023-08-15 ENCOUNTER — Encounter: Payer: Self-pay | Admitting: Podiatry

## 2023-08-15 DIAGNOSIS — M722 Plantar fascial fibromatosis: Secondary | ICD-10-CM

## 2023-08-15 MED ORDER — TRIAMCINOLONE ACETONIDE 10 MG/ML IJ SUSP
10.0000 mg | Freq: Once | INTRAMUSCULAR | Status: AC
  Administered 2023-08-15: 10 mg via INTRA_ARTICULAR

## 2023-08-15 NOTE — Patient Instructions (Signed)

## 2023-08-16 NOTE — Progress Notes (Signed)
 Subjective:   Patient ID: Roberta Reynolds, female   DOB: 43 y.o.   MRN: [id number]   HPI Patient presents with a lot of pain in the bottom of the right heel and states that it has been very sore and hard to walk.  Patient states it has been going on around 3 months and does not remember injury also has structural bunions of both feet.  Patient does not smoke and is active   Review of Systems  All other systems reviewed and are negative.       Objective:  Physical Exam Vitals and nursing note reviewed.  Constitutional:      Appearance: She is well-developed.  Pulmonary:     Effort: Pulmonary effort is normal.  Musculoskeletal:        General: Normal range of motion.  Skin:    General: Skin is warm.  Neurological:     Mental Status: She is alert.     Neurovascular status intact muscle strength found to be adequate range of motion within normal limits with exquisite discomfort in the medial fascial band right at the insertional point tendon calcaneus with fluid buildup     Assessment:  Acute plantar fasciitis right inflammation fluid medial band     Plan:  H&P reviewed sterile prep and injected the fascia at insertion 3 mg Kenalog 5 mg Xylocaine and applied sterile dressing.  Gave instructions on support and stretching exercises reappoint as needed continue meloxicam  X-rays indicate small spur no indication stress fracture arthritis continue the meloxicam that she is taken

## 2023-09-06 ENCOUNTER — Ambulatory Visit: Payer: Medicaid Other | Admitting: Obstetrics & Gynecology

## 2023-09-06 VITALS — BP 105/70 | HR 64 | Wt 196.0 lb

## 2023-09-06 DIAGNOSIS — Z01419 Encounter for gynecological examination (general) (routine) without abnormal findings: Secondary | ICD-10-CM | POA: Diagnosis not present

## 2023-09-06 DIAGNOSIS — Z1339 Encounter for screening examination for other mental health and behavioral disorders: Secondary | ICD-10-CM

## 2023-09-06 DIAGNOSIS — Z309 Encounter for contraceptive management, unspecified: Secondary | ICD-10-CM | POA: Diagnosis not present

## 2023-09-06 DIAGNOSIS — Z1231 Encounter for screening mammogram for malignant neoplasm of breast: Secondary | ICD-10-CM

## 2023-09-06 MED ORDER — PHEXXI 1.8-1-0.4 % VA GEL: VAGINAL | 20 refills | Status: DC

## 2023-09-06 NOTE — Progress Notes (Signed)
 GYNECOLOGY ANNUAL PREVENTATIVE CARE ENCOUNTER NOTE  History:     Roberta Reynolds is a 43 y.o. [phone] female here for a routine annual gynecologic exam.  Current complaints: none.   Denies abnormal vaginal bleeding, discharge, pelvic pain, problems with intercourse or other gynecologic concerns.    Gynecologic History Patient's last menstrual period was 08/30/2023. Contraception:  Phexxi Last Pap: 08/29/2022. Result was normal with negative HPV Last Mammogram: 07/22/2022.  Result was normal  Obstetric History OB History  Gravida Para Term Preterm AB Living  4 3 3  0 1 3  SAB IAB Ectopic Multiple Live Births  1 0 0 0 3    # Outcome Date GA Lbr Len/2nd Weight Sex Type Anes PTL Lv  4 Term 06/18/18 [redacted]w[redacted]d  6 lb 12.3 oz (3.07 kg) M CS-LTranv Gen  LIV  3 Term 01/07/13    F CS-LTranv   LIV  2 Term 07/19/10    F CS-LTranv   LIV  1 SAB 06/26/09     SAB       Obstetric Comments  Unable to tolerate epidural - Needs general anesthesia only    Past Medical History:  Diagnosis Date   Complication of anesthesia    spinal heachache wants general anesthesia for CS   Depression    Dyspnea    GERD (gastroesophageal reflux disease)    Hypercholesteremia 05/30/2021   Spinal headache    for four days    Past Surgical History:  Procedure Laterality Date   BREAST BIOPSY Right 07/28/2022   [place] RT BREAST BX W LOC DEV 1ST LESION IMG BX SPEC US GUIDE 07/28/2022 [facility]MO[facility]   CESAREAN SECTION     CESAREAN SECTION N/A 06/18/2018   Procedure: CESAREAN SECTION;  Surgeon: Timnath Bing, MD;  Location: [facility];  Service: Obstetrics;  Laterality: N/A;   CESAREAN SECTION     CHOLECYSTECTOMY N/A 11/05/2019   Procedure: LAPAROSCOPIC CHOLECYSTECTOMY WITH INTRAOPERATIVE CHOLANGIOGRAM;  Surgeon: [name], MD;  Location: [facility];  Service: General;  Laterality: N/A;   HYSTEROSCOPY WITH D & C N/A 08/31/2021   Procedure: DILATATION AND CURETTAGE /HYSTEROSCOPY;  Surgeon: Tereso Newcomer, MD;  Location:  [facility];  Service: Gynecology;  Laterality: N/A;    Current Outpatient Medications on File Prior to Visit  Medication Sig Dispense Refill   meloxicam (MOBIC) 7.5 MG tablet Take 1 tablet (7.5 mg total) by mouth daily. With food 30 tablet 1   Multiple Vitamin (MULTI-VITAMIN) tablet Take 1 tablet by mouth daily.     omeprazole (PRILOSEC) 40 MG capsule Take 1 capsule (40 mg total) by mouth daily. 90 capsule 1   No current facility-administered medications on file prior to visit.    Allergies  Allergen Reactions   Other Other (See Comments)    Continuous Epidural Tray- caused a "Spinal headache" and "I could not stand"    Chalk Itching   Sprintec 28 [Norgestimate-Eth Estradiol] Other (See Comments)    Affected the libido negatively (low sexual drive)    Social History:  reports that she has never smoked. She has never used smokeless tobacco. She reports that she does not drink alcohol and does not use drugs.  Family History  Problem Relation Age of Onset   Cancer Maternal Aunt    Cancer Paternal Uncle    Hypertension Father    Breast cancer Paternal Aunt    Breast cancer Cousin    The following portions of the patient's history were reviewed and  updated as appropriate: allergies, current medications, past family history, past medical history, past social history, past surgical history and problem list.  Review of Systems Pertinent items noted in HPI and remainder of comprehensive ROS otherwise negative.  Physical Exam:  BP 105/70   Pulse 64   Wt 196 lb (88.9 kg)   LMP 08/30/2023   BMI 32.62 kg/m  CONSTITUTIONAL: Well-developed, well-nourished female in no acute distress.  HENT:  Normocephalic, atraumatic, External right and left ear normal.  EYES: Conjunctivae and EOM are normal. Pupils are equal, round, and reactive to light. No scleral icterus.  NECK: Normal range of motion, supple, no masses.  Normal thyroid.  SKIN: Skin is warm and  dry. No rash noted. Not diaphoretic. No erythema. No pallor. MUSCULOSKELETAL: Normal range of motion. No tenderness.  No cyanosis, clubbing, or edema. NEUROLOGIC: Alert and oriented to person, place, and time. Normal reflexes, muscle tone coordination.  PSYCHIATRIC: Normal mood and affect. Normal behavior. Normal judgment and thought content. CARDIOVASCULAR: Normal heart rate noted, regular rhythm RESPIRATORY: Clear to auscultation bilaterally. Effort and breath sounds normal, no problems with respiration noted. BREASTS: Symmetric in size. No masses, tenderness, skin changes, nipple drainage, or lymphadenopathy bilaterally. Performed in the presence of a chaperone. ABDOMEN: Soft, no distention noted.  No tenderness, rebound or guarding.  PELVIC: Deferred   Assessment and Plan:     1. Breast cancer screening by mammogram Mammogram ordered, will follow up results and manage accordingly. - MM 3D SCREENING MAMMOGRAM BILATERAL BREAST; Future  2. Encounter for contraceptive management, unspecified type Phexxi refilled - PHEXXI 1.8-1-0.4 % GEL; PLEASE SEE ATTACHED FOR DETAILED DIRECTIONS  Dispense: 5 g; Refill: 20  3. Well woman exam with routine gynecological exam (Primary) Pap smear is up to date. Routine preventative health maintenance measures emphasized. Please refer to After Visit Summary for other counseling recommendations.      Jaynie Collins, MD, FACOG Obstetrician & Gynecologist, [facility] for [employer], [facility]

## 2023-09-27 ENCOUNTER — Ambulatory Visit
Admission: RE | Admit: 2023-09-27 | Discharge: 2023-09-27 | Disposition: A | Discharge status: Home / Self Care | Source: Ambulatory Visit | Attending: Obstetrics & Gynecology | Admitting: Obstetrics & Gynecology

## 2023-09-27 DIAGNOSIS — Z1231 Encounter for screening mammogram for malignant neoplasm of breast: Secondary | ICD-10-CM

## 2023-09-28 ENCOUNTER — Encounter: Payer: Self-pay | Admitting: Obstetrics & Gynecology

## 2023-10-16 ENCOUNTER — Ambulatory Visit: Attending: Nurse Practitioner | Admitting: Physical Therapy

## 2023-10-16 ENCOUNTER — Ambulatory Visit: Payer: Medicaid Other | Admitting: Physical Therapy

## 2023-10-16 ENCOUNTER — Other Ambulatory Visit: Payer: Self-pay

## 2023-10-16 DIAGNOSIS — M6281 Muscle weakness (generalized): Secondary | ICD-10-CM | POA: Insufficient documentation

## 2023-10-16 DIAGNOSIS — R279 Unspecified lack of coordination: Secondary | ICD-10-CM | POA: Diagnosis present

## 2023-10-16 DIAGNOSIS — N3946 Mixed incontinence: Secondary | ICD-10-CM | POA: Insufficient documentation

## 2023-10-16 DIAGNOSIS — M62838 Other muscle spasm: Secondary | ICD-10-CM | POA: Diagnosis present

## 2023-10-16 DIAGNOSIS — R293 Abnormal posture: Secondary | ICD-10-CM | POA: Insufficient documentation

## 2023-10-16 NOTE — Therapy (Signed)
 OUTPATIENT PHYSICAL THERAPY FEMALE PELVIC EVALUATION   Patient Name: Roberta Reynolds MRN: [id number] DOB:10-29-1980, 43 y.o., female Today's Date: 10/16/2023  END OF SESSION:  PT End of Session - 10/16/23 1016     Visit Number 1    Date for PT Re-Evaluation 04/16/24    Authorization Type [place] MEDICAID [employer]    Authorization - Number of Visits 27    PT Start Time 1015    PT Stop Time 1045    PT Time Calculation (min) 30 min    Activity Tolerance Patient tolerated treatment well    Behavior During Therapy WFL for tasks assessed/performed             Past Medical History:  Diagnosis Date   Complication of anesthesia    spinal heachache wants general anesthesia for CS   Depression    Dyspnea    GERD (gastroesophageal reflux disease)    Hypercholesteremia 05/30/2021   Spinal headache    for four days   Past Surgical History:  Procedure Laterality Date   BREAST BIOPSY Right 07/28/2022   US  RT BREAST BX W LOC DEV 1ST LESION IMG BX SPEC US  GUIDE 07/28/2022 [facility]AMMOGRAP[facility]   CESAREAN SECTION     CESAREAN SECTION N/A 06/18/2018   Procedure: CESAREAN SECTION;  Surgeon: Raynell Caller, MD;  Location: [facility];  Service: Obstetrics;  Laterality: N/A;   CESAREAN SECTION     CHOLECYSTECTOMY N/A 11/05/2019   Procedure: LAPAROSCOPIC CHOLECYSTECTOMY WITH INTRAOPERATIVE CHOLANGIOGRAM;  Surgeon: Oza Blumenthal, MD;  Location: [facility];  Service: General;  Laterality: N/A;   HYSTEROSCOPY WITH D & C N/A 08/31/2021   Procedure: DILATATION AND CURETTAGE /HYSTEROSCOPY;  Surgeon: Julianne Octave, MD;  Location: [facility];  Service: Gynecology;  Laterality: N/A;   Patient Active Problem List   Diagnosis Date Noted   Atypical endometrial cells on Papanicolaou smear 08/31/2021   Abnormal uterine bleeding (AUB) 08/08/2021    PCP: Collins Dean, NP  REFERRING PROVIDER: Collins Dean, NP  REFERRING DIAG: N39.46 (ICD-10-CM) - Mixed  incontinence urge and stress   THERAPY DIAG:  Muscle weakness (generalized)  Abnormal posture  Unspecified lack of coordination  Rationale for Evaluation and Treatment: Rehabilitation  ONSET DATE: 5 years   SUBJECTIVE:                                                                                                                                                                                           SUBJECTIVE STATEMENT: Has had three c-sections and feels like pelvic floor has gotten weaker. I feel like I'm not active because I feel  like I can't hold urine.  Drinking any amount of water and sometimes without having to drinking it has strong urge to urinate and needs to rush to the bathroom. Has some urinary incontinence with urge and may be drops (most often) but other times I've held it too long and may have a full loss.   Fluid intake: water - drinks all day (feels thirsty a lot); coffee with milk in morning, sometimes tea with milk at night  PAIN:  Are you having pain? Yes NPRS scale: 4/10 Pain location:  lower abdomen  Pain type: cramping  Pain description: intermittent   Aggravating factors: period Relieving factors: ending period   PRECAUTIONS: None  RED FLAGS: None   WEIGHT BEARING RESTRICTIONS: No  FALLS:  Has patient fallen in last 6 months? No  OCCUPATION: not currently   ACTIVITY LEVEL : low   PLOF: Independent  PATIENT GOALS: to have less leakage and learn how to be more active without leakage   PERTINENT HISTORY:  HYSTEROSCOPY WITH D & C, CESAREAN SECTION x3, depression Sexual abuse: No  BOWEL MOVEMENT: Pain with bowel movement: No Type of bowel movement:Type (Bristol Stool Scale) 4, Frequency daily, and Strain 4 Fully empty rectum: Yes:   Leakage: No Pads: No Fiber supplement/laxative No  URINATION: Pain with urination: No Fully empty bladder: Yes:   Stream: Strong Urgency: Yes  but mostly just once in a bathroom (once sees a bathroom  or is cold or asked about a bathroom needs to go immediately)  Frequency: sometimes every 45 mins sometimes can space it out if not drinking a lot; 2-3x nightly  Leakage: Walking to the bathroom Pads: No  INTERCOURSE:  Ability to have vaginal penetration No  Pain with intercourse: Initial Penetration DrynessYes sometimes Climax: not painful Marinoff Scale: 1/3 Doesn't use a lubricant usually - reports her skin is very sensitive   PREGNANCY: Vaginal deliveries 0  C-section deliveries 3 Currently pregnant No  PROLAPSE: Pressure    OBJECTIVE:  Note: Objective measures were completed at Evaluation unless otherwise noted.  DIAGNOSTIC FINDINGS:    PATIENT SURVEYS:    PFIQ-7: bladder - 68  COGNITION: Overall cognitive status: Within functional limits for tasks assessed     SENSATION: Light touch: Appears intact  LUMBAR SPECIAL TESTS:  Single leg stance test: hip drop noted bil with 2-3s stance on either leg, SI Compression/distraction test: Negative, and FABER test: Negative  FUNCTIONAL TESTS:  Functional squat - bil knee valgus, trunk flexion, hands required at thighs to return to standing and decreased descent by 50%  GAIT: WFL  POSTURE: rounded shoulders, forward head, and posterior pelvic tilt   LUMBARAROM/PROM:  A/PROM A/PROM  eval  Flexion Limited by 25%  Extension WFL  Right lateral flexion Limited by 25%  Left lateral flexion Limited by 25%  Right rotation Limited by 25%  Left rotation Limited by 25%   (Blank rows = not tested)  LOWER EXTREMITY ROM:  Bil hip ER,IR, hamstrings Limited by 25%  LOWER EXTREMITY MMT:  Bil hips grossly 3+/5 except flexion 4/5; knees 4+/5 PALPATION:   General: tightness noted in bil paraspinals at lumbar and thoracic spine; tightness noted in bil piriformis   Pelvic Alignment: WFL  Abdominal: no TTP but does have fascial restriction in lower abdominal quadrants, restrictions in c-section noted in all directions  with mild TTP                  External Perineal Exam: mild dryness noted and slight  redness at vulva  Reports she feels the need to wipe herself several times daily feeling wet but reports she feels like she needs to be clean. Denies urine leakage with all cleaning, just sometimes "needs to wipe to be clean throughout the day".                              Internal Pelvic Floor: no TTP but reports feeling pressure at rectum with palpation of posterior pelvic floor  Patient confirms identification and approves PT to assess internal pelvic floor and treatment Yes  PELVIC MMT:   MMT eval  Vaginal 1-2/5; 1s; 2 reps  Internal Anal Sphincter   External Anal Sphincter   Puborectalis   Diastasis Recti   (Blank rows = not tested)        TONE: increased   PROLAPSE: Not seen in hooklying with cough   TODAY'S TREATMENT:                                                                                                                              DATE:   10/16/23 EVAL Examination completed, findings reviewed, pt educated on POC, HEP. Pt motivated to participate in PT and agreeable to attempt recommendations.     If treatment provided at initial evaluation, no treatment charged due to lack of authorization.       PATIENT EDUCATION:  Education details: [email or web address] Person educated: Patient Education method: Programmer, multimedia, Demonstration, Actor cues, Verbal cues, and Handouts Education comprehension: verbalized understanding, returned demonstration, verbal cues required, tactile cues required, and needs further education  HOME EXERCISE PROGRAM: [email or web address]  ASSESSMENT:  CLINICAL IMPRESSION: Patient is a 43 y.o. female  who was seen today for physical therapy evaluation and treatment for mixed incontinence. Pt demonstrated decreased flexibility in spine and hips, decreased core and hip strength, fascial restrictions throughout abdomen. Patient consented to internal pelvic floor assessment  vaginally this date and found to have decreased strength, endurance, and coordination. Patient benefited from verbal cues for improved technique with pelvic floor contractions. Pt demonstrated mildly dry and red vulva and reports she wipes and cleans herself several times daily due to feeling wet, not with urine per pt but just to be clean. Pt reports she does feel fearful of cleanliness of public bathroom and tries to urinate several times before leaving the house to decreased risk of needing to use these. Therefore has had increased frequency of urine often. Pt would benefit from additional PT to further address deficits.     OBJECTIVE IMPAIRMENTS: decreased activity tolerance, decreased coordination, decreased endurance, decreased mobility, decreased ROM, decreased strength, increased fascial restrictions, increased muscle spasms, impaired flexibility, improper body mechanics, postural dysfunction, and pain.   ACTIVITY LIMITATIONS: carrying, squatting, continence, and locomotion level  PARTICIPATION LIMITATIONS: interpersonal relationship and community activity  PERSONAL FACTORS: Fitness, Time since onset of injury/illness/exacerbation, and 1 comorbidity: medical history   are also affecting  patient's functional outcome.   REHAB POTENTIAL: Good  CLINICAL DECISION MAKING: Stable/uncomplicated  EVALUATION COMPLEXITY: Low   GOALS: Goals reviewed with patient? Yes  SHORT TERM GOALS: Target date: 11/13/23  Pt to be I with HEP for carry over and continuing recommendations for improved outcomes.   Baseline: Goal status: INITIAL  2.  Pt will have 25% less urgency due to bladder retraining and strengthening for improved ability to leave the house without needing to use public bathrooms per pt's personal preference.  Baseline:  Goal status: INITIAL  3.  Pt to report improved time between bladder voids to at least 2 hours for improved QOL with decreased urinary frequency.   Baseline:  Goal  status: INITIAL  4.  Pt will be independent with the knack, urge suppression technique, and double voiding in order to improve bladder habits and decrease urinary incontinence.   Baseline:  Goal status: INITIAL   LONG TERM GOALS: Target date: 04/16/24  Pt to be I with advanced HEP for carry over and continuing recommendations for improved outcomes.   Baseline:  Goal status: INITIAL  2.  Pt will have 50% less urgency due to bladder retraining and strengthening for improved QOL and decreased urinary frequency.   Baseline:  Goal status: INITIAL  3.  Pt to report improved time between bladder voids to at least 3 hours for improved QOL with decreased urinary frequency.   Baseline:  Goal status: INITIAL  4.  Pt to demonstrate improved coordination of pelvic floor and breathing mechanics with 10# squat with appropriate synergistic patterns to decrease pain and leakage at least 75% of the time for improved ability to complete a 30 minute workout with strain at pelvic floor and symptoms.    Baseline:  Goal status: INITIAL  5.  Pt to demonstrate full mobility in trunk in all directions without pain for decreased strain at pelvis for improved tolerance to walking at least 30 mins without pain.  Baseline:  Goal status: INITIAL  6.  Pt to report no more than 1 instance of urinary incontinence within one month for improved QOL and skin integrity.  Baseline:  Goal status: INITIAL  PLAN:  PT FREQUENCY: 2x/week  PT DURATION:  12 sessions  PLANNED INTERVENTIONS: 97110-Therapeutic exercises, 97530- Therapeutic activity, 97112- Neuromuscular re-education, 97535- Self Care, 16109- Manual therapy, [phone]- Aquatic Therapy, [phone]- Electrical stimulation (manual), 97016- Vasopneumatic device, Patient/Family education, Taping, Dry Needling, Joint mobilization, Spinal mobilization, Scar mobilization, DME instructions, Cryotherapy, Moist heat, and Biofeedback  PLAN FOR NEXT SESSION: internal as needed  and pt consents, coordination of pelvic floor and breathing and activity, hip and core strengthening, breathing and voiding mechanics,   Avie Lemme, PT, DPT 04/22/258:28 PM

## 2023-10-17 ENCOUNTER — Ambulatory Visit: Admitting: Physical Therapy

## 2023-10-17 DIAGNOSIS — M62838 Other muscle spasm: Secondary | ICD-10-CM

## 2023-10-17 DIAGNOSIS — M6281 Muscle weakness (generalized): Secondary | ICD-10-CM | POA: Diagnosis not present

## 2023-10-17 DIAGNOSIS — R293 Abnormal posture: Secondary | ICD-10-CM

## 2023-10-17 DIAGNOSIS — R279 Unspecified lack of coordination: Secondary | ICD-10-CM

## 2023-10-17 NOTE — Patient Instructions (Signed)

## 2023-10-17 NOTE — Therapy (Signed)
 OUTPATIENT PHYSICAL THERAPY FEMALE PELVIC TREATMENT   Patient Name: Roberta Reynolds MRN: [id number] DOB:[birth year], 43 y.o., female Today's Date: 10/17/2023  END OF SESSION:  PT End of Session - 10/17/23 1445     Visit Number 2    Date for PT Re-Evaluation 04/16/24    Authorization Type [place] MEDICAID [employer]    Authorization - Number of Visits 27    PT Start Time 1445    PT Stop Time 1528    PT Time Calculation (min) 43 min    Activity Tolerance Patient tolerated treatment well    Behavior During Therapy WFL for tasks assessed/performed             Past Medical History:  Diagnosis Date   Complication of anesthesia    spinal heachache wants general anesthesia for CS   Depression    Dyspnea    GERD (gastroesophageal reflux disease)    Hypercholesteremia 05/30/2021   Spinal headache    for four days   Past Surgical History:  Procedure Laterality Date   BREAST BIOPSY Right 07/28/2022   US  RT BREAST BX W LOC DEV 1ST LESION IMG BX SPEC US  GUIDE 07/28/2022 [facility]AMMOGRAP[facility]   CESAREAN SECTION     CESAREAN SECTION N/A 06/18/2018   Procedure: CESAREAN SECTION;  Surgeon: Raynell Caller, MD;  Location: [facility];  Service: Obstetrics;  Laterality: N/A;   CESAREAN SECTION     CHOLECYSTECTOMY N/A 11/05/2019   Procedure: LAPAROSCOPIC CHOLECYSTECTOMY WITH INTRAOPERATIVE CHOLANGIOGRAM;  Surgeon: Oza Blumenthal, MD;  Location: [facility];  Service: General;  Laterality: N/A;   HYSTEROSCOPY WITH D & C N/A 08/31/2021   Procedure: DILATATION AND CURETTAGE /HYSTEROSCOPY;  Surgeon: Julianne Octave, MD;  Location: [facility];  Service: Gynecology;  Laterality: N/A;   Patient Active Problem List   Diagnosis Date Noted   Atypical endometrial cells on Papanicolaou smear 08/31/2021   Abnormal uterine bleeding (AUB) 08/08/2021    PCP: Collins Dean, NP  REFERRING PROVIDER: Collins Dean, NP  REFERRING DIAG: N39.46 (ICD-10-CM) - Mixed  incontinence urge and stress   THERAPY DIAG:  Muscle weakness (generalized)  Abnormal posture  Unspecified lack of coordination  Other muscle spasm  Rationale for Evaluation and Treatment: Rehabilitation  ONSET DATE: 5 years   SUBJECTIVE:                                                                                                                                                                                           SUBJECTIVE STATEMENT: Has had three c-sections and feels like pelvic floor has gotten weaker. I feel like I'm not  active because I feel like I can't hold urine.  Drinking any amount of water and sometimes without having to drinking it has strong urge to urinate and needs to rush to the bathroom. Has some urinary incontinence with urge and may be drops (most often) but other times I've held it too long and may have a full loss.   Fluid intake: water - drinks all day (feels thirsty a lot); coffee with milk in morning, sometimes tea with milk at night  PAIN:  Are you having pain? Yes NPRS scale: 4/10 Pain location:  lower abdomen  Pain type: cramping  Pain description: intermittent   Aggravating factors: period Relieving factors: ending period   PRECAUTIONS: None  RED FLAGS: None   WEIGHT BEARING RESTRICTIONS: No  FALLS:  Has patient fallen in last 6 months? No  OCCUPATION: not currently   ACTIVITY LEVEL : low   PLOF: Independent  PATIENT GOALS: to have less leakage and learn how to be more active without leakage   PERTINENT HISTORY:  HYSTEROSCOPY WITH D & C, CESAREAN SECTION x3, depression Sexual abuse: No  BOWEL MOVEMENT: Pain with bowel movement: No Type of bowel movement:Type (Bristol Stool Scale) 4, Frequency daily, and Strain 4 Fully empty rectum: Yes:   Leakage: No Pads: No Fiber supplement/laxative No  URINATION: Pain with urination: No Fully empty bladder: Yes:   Stream: Strong Urgency: Yes  but mostly just once in a bathroom  (once sees a bathroom or is cold or asked about a bathroom needs to go immediately)  Frequency: sometimes every 45 mins sometimes can space it out if not drinking a lot; 2-3x nightly  Leakage: Walking to the bathroom Pads: No  INTERCOURSE:  Ability to have vaginal penetration No  Pain with intercourse: Initial Penetration DrynessYes sometimes Climax: not painful Marinoff Scale: 1/3 Doesn't use a lubricant usually - reports her skin is very sensitive   PREGNANCY: Vaginal deliveries 0  C-section deliveries 3 Currently pregnant No  PROLAPSE: Pressure    OBJECTIVE:  Note: Objective measures were completed at Evaluation unless otherwise noted.  DIAGNOSTIC FINDINGS:    PATIENT SURVEYS:    PFIQ-7: bladder - 100  COGNITION: Overall cognitive status: Within functional limits for tasks assessed     SENSATION: Light touch: Appears intact  LUMBAR SPECIAL TESTS:  Single leg stance test: hip drop noted bil with 2-3s stance on either leg, SI Compression/distraction test: Negative, and FABER test: Negative  FUNCTIONAL TESTS:  Functional squat - bil knee valgus, trunk flexion, hands required at thighs to return to standing and decreased descent by 50%  GAIT: WFL  POSTURE: rounded shoulders, forward head, and posterior pelvic tilt   LUMBARAROM/PROM:  A/PROM A/PROM  eval  Flexion Limited by 25%  Extension WFL  Right lateral flexion Limited by 25%  Left lateral flexion Limited by 25%  Right rotation Limited by 25%  Left rotation Limited by 25%   (Blank rows = not tested)  LOWER EXTREMITY ROM:  Bil hip ER,IR, hamstrings Limited by 25%  LOWER EXTREMITY MMT:  Bil hips grossly 3+/5 except flexion 4/5; knees 4+/5 PALPATION:   General: tightness noted in bil paraspinals at lumbar and thoracic spine; tightness noted in bil piriformis   Pelvic Alignment: WFL  Abdominal: no TTP but does have fascial restriction in lower abdominal quadrants, restrictions in c-section  noted in all directions with mild TTP                  External Perineal Exam: mild  dryness noted and slight redness at vulva  Reports she feels the need to wipe herself several times daily feeling wet but reports she feels like she needs to be clean. Denies urine leakage with all cleaning, just sometimes "needs to wipe to be clean throughout the day".                              Internal Pelvic Floor: no TTP but reports feeling pressure at rectum with palpation of posterior pelvic floor  Patient confirms identification and approves PT to assess internal pelvic floor and treatment Yes  PELVIC MMT:   MMT eval  Vaginal 1-2/5; 1s; 2 reps  Internal Anal Sphincter   External Anal Sphincter   Puborectalis   Diastasis Recti   (Blank rows = not tested)        TONE: increased   PROLAPSE: Not seen in hooklying with cough   TODAY'S TREATMENT:                                                                                                                              DATE:   10/16/23 EVAL Examination completed, findings reviewed, pt educated on POC, HEP. Pt motivated to participate in PT and agreeable to attempt recommendations.    10/17/23: Pt educated on urge drill and handout given  Diaphragmatic breathing 3x10 with cues for pelvic floor down training Butterfly stretch 2x30s Windshield wipers x10  Cat/cow x10 Earline Glenn pose 3x30s Open books x10 each Sitting on yoga ball - x10 pelvic tilts, x10 circles each way Sitting on yoga ball: 2x10 pelvic floor contractions, 2x10 quick flicks, 5x5s isometrics (reports unable to hold the full 5s usually losses it around 3s   PATIENT EDUCATION:  Education details: [email or web address] Person educated: Patient Education method: Explanation, Demonstration, Tactile cues, Verbal cues, and Handouts Education comprehension: verbalized understanding, returned demonstration, verbal cues required, tactile cues required, and needs further education  HOME EXERCISE  PROGRAM: Access Code: [email or web address] URL: [email or web address] Date: 10/17/2023 Prepared by: Fairy Homer  Exercises - Supine Diaphragmatic Breathing  - 1 x daily - 7 x weekly - 3 sets - 10 reps - Supine Pelvic Floor Contraction  - 1 x daily - 7 x weekly - 3 sets - 10 reps - Sidelying Thoracic Rotation with Open Book  - 1 x daily - 7 x weekly - 1 sets - 10 reps - Supported Teacher, music with Pelvic Floor Relaxation  - 1 x daily - 7 x weekly - 1 sets - 3 reps - 30s holds - Seated Happy Baby With Trunk Flexion For Pelvic Relaxation  - 1 x daily - 7 x weekly - 1 sets - 3 reps - 30s holds - Pelvic Circles on Swiss Ball  - 1 x daily - 7 x weekly - 2 sets - 10 reps  ASSESSMENT:  CLINICAL IMPRESSION: Patient is a 43 y.o. female  who was seen today for  physical therapy evaluation and treatment for mixed incontinence. Session focused on relaxing glutes, adductors, and abdominals to allow improved mobility for proper mechanics once able to progress to strengthening. Pt tolerated well but needed moderate verbal cues for decreased abdominal gripping, diaphragmatic breathing, and techniques throughout. Pt denied pain at end of session, urge drill and HEP update. Pt would benefit from additional PT to further address deficits.     OBJECTIVE IMPAIRMENTS: decreased activity tolerance, decreased coordination, decreased endurance, decreased mobility, decreased ROM, decreased strength, increased fascial restrictions, increased muscle spasms, impaired flexibility, improper body mechanics, postural dysfunction, and pain.   ACTIVITY LIMITATIONS: carrying, squatting, continence, and locomotion level  PARTICIPATION LIMITATIONS: interpersonal relationship and community activity  PERSONAL FACTORS: Fitness, Time since onset of injury/illness/exacerbation, and 1 comorbidity: medical history   are also affecting patient's functional outcome.   REHAB POTENTIAL: Good  CLINICAL DECISION MAKING:  Stable/uncomplicated  EVALUATION COMPLEXITY: Low   GOALS: Goals reviewed with patient? Yes  SHORT TERM GOALS: Target date: 11/13/23  Pt to be I with HEP for carry over and continuing recommendations for improved outcomes.   Baseline: Goal status: INITIAL  2.  Pt will have 25% less urgency due to bladder retraining and strengthening for improved ability to leave the house without needing to use public bathrooms per pt's personal preference.  Baseline:  Goal status: INITIAL  3.  Pt to report improved time between bladder voids to at least 2 hours for improved QOL with decreased urinary frequency.   Baseline:  Goal status: INITIAL  4.  Pt will be independent with the knack, urge suppression technique, and double voiding in order to improve bladder habits and decrease urinary incontinence.   Baseline:  Goal status: INITIAL   LONG TERM GOALS: Target date: 04/16/24  Pt to be I with advanced HEP for carry over and continuing recommendations for improved outcomes.   Baseline:  Goal status: INITIAL  2.  Pt will have 50% less urgency due to bladder retraining and strengthening for improved QOL and decreased urinary frequency.   Baseline:  Goal status: INITIAL  3.  Pt to report improved time between bladder voids to at least 3 hours for improved QOL with decreased urinary frequency.   Baseline:  Goal status: INITIAL  4.  Pt to demonstrate improved coordination of pelvic floor and breathing mechanics with 10# squat with appropriate synergistic patterns to decrease pain and leakage at least 75% of the time for improved ability to complete a 30 minute workout with strain at pelvic floor and symptoms.    Baseline:  Goal status: INITIAL  5.  Pt to demonstrate full mobility in trunk in all directions without pain for decreased strain at pelvis for improved tolerance to walking at least 30 mins without pain.  Baseline:  Goal status: INITIAL  6.  Pt to report no more than 1 instance of  urinary incontinence within one month for improved QOL and skin integrity.  Baseline:  Goal status: INITIAL  PLAN:  PT FREQUENCY: 2x/week  PT DURATION:  12 sessions  PLANNED INTERVENTIONS: 97110-Therapeutic exercises, 97530- Therapeutic activity, 97112- Neuromuscular re-education, 97535- Self Care, 16109- Manual therapy, [phone]- Aquatic Therapy, [phone]- Electrical stimulation (manual), 97016- Vasopneumatic device, Patient/Family education, Taping, Dry Needling, Joint mobilization, Spinal mobilization, Scar mobilization, DME instructions, Cryotherapy, Moist heat, and Biofeedback  PLAN FOR NEXT SESSION: internal as needed and pt consents, coordination of pelvic floor and breathing and activity, hip and core strengthening, breathing and voiding mechanics,   Avie Lemme, PT, DPT 04/23/253:30  PM

## 2023-11-07 ENCOUNTER — Ambulatory Visit: Payer: Self-pay | Admitting: Physical Therapy

## 2023-11-07 ENCOUNTER — Ambulatory Visit: Attending: Nurse Practitioner | Admitting: Physical Therapy

## 2023-11-07 DIAGNOSIS — M6281 Muscle weakness (generalized): Secondary | ICD-10-CM | POA: Diagnosis present

## 2023-11-07 DIAGNOSIS — R293 Abnormal posture: Secondary | ICD-10-CM | POA: Diagnosis present

## 2023-11-07 DIAGNOSIS — R279 Unspecified lack of coordination: Secondary | ICD-10-CM

## 2023-11-07 NOTE — Patient Instructions (Signed)

## 2023-11-07 NOTE — Therapy (Addendum)
 OUTPATIENT PHYSICAL THERAPY FEMALE PELVIC TREATMENT   Patient Name: Roberta Reynolds MRN: [id number] DOB:Jan 05, 1981, 43 y.o., female Today's Date: 11/07/2023  END OF SESSION:  PT End of Session - 11/07/23 1535     Visit Number 3    Date for PT Re-Evaluation 04/16/24    Authorization Type [place] MEDICAID [employer]    Authorization - Number of Visits 27    PT Start Time 1535    PT Stop Time 1613    PT Time Calculation (min) 38 min    Activity Tolerance Patient tolerated treatment well    Behavior During Therapy WFL for tasks assessed/performed             Past Medical History:  Diagnosis Date   Complication of anesthesia    spinal heachache wants general anesthesia for CS   Depression    Dyspnea    GERD (gastroesophageal reflux disease)    Hypercholesteremia 05/30/2021   Spinal headache    for four days   Past Surgical History:  Procedure Laterality Date   BREAST BIOPSY Right 07/28/2022   US  RT BREAST BX W LOC DEV 1ST LESION IMG BX SPEC US  GUIDE 07/28/2022 [facility]MO[facility]   CESAREAN SECTION     CESAREAN SECTION N/A 06/18/2018   Procedure: CESAREAN SECTION;  Surgeon: Raynell Caller, MD;  Location: [facility];  Service: Obstetrics;  Laterality: N/A;   CESAREAN SECTION     CHOLECYSTECTOMY N/A 11/05/2019   Procedure: LAPAROSCOPIC CHOLECYSTECTOMY WITH INTRAOPERATIVE CHOLANGIOGRAM;  Surgeon: Oza Blumenthal, MD;  Location: [facility];  Service: General;  Laterality: N/A;   HYSTEROSCOPY WITH D & C N/A 08/31/2021   Procedure: DILATATION AND CURETTAGE /HYSTEROSCOPY;  Surgeon: Julianne Octave, MD;  Location: [facility];  Service: Gynecology;  Laterality: N/A;   Patient Active Problem List   Diagnosis Date Noted   Atypical endometrial cells on Papanicolaou smear 08/31/2021   Abnormal uterine bleeding (AUB) 08/08/2021    PCP: Collins Dean, NP  REFERRING PROVIDER: Collins Dean, NP  REFERRING DIAG: N39.46 (ICD-10-CM) - Mixed  incontinence urge and stress   THERAPY DIAG:  Abnormal posture  Muscle weakness (generalized)  Unspecified lack of coordination  Rationale for Evaluation and Treatment: Rehabilitation  ONSET DATE: 5 years   SUBJECTIVE:                                                                                                                                                                                           SUBJECTIVE STATEMENT: Has had three c-sections and feels like pelvic floor has gotten weaker. I feel like I'm not active because I feel  like I can't hold urine.  Drinking any amount of water and sometimes without having to drinking it has strong urge to urinate and needs to rush to the bathroom. Has some urinary incontinence with urge and may be drops (most often) but other times I've held it too long and may have a full loss.   Fluid intake: water - drinks all day (feels thirsty a lot); coffee with milk in morning, sometimes tea with milk at night  PAIN:  Are you having pain? Yes NPRS scale: 4/10 Pain location: lower abdomen  Pain type: cramping  Pain description: intermittent   Aggravating factors: period Relieving factors: ending period   PRECAUTIONS: None  RED FLAGS: None   WEIGHT BEARING RESTRICTIONS: No  FALLS:  Has patient fallen in last 6 months? No  OCCUPATION: not currently   ACTIVITY LEVEL : low   PLOF: Independent  PATIENT GOALS: to have less leakage and learn how to be more active without leakage   PERTINENT HISTORY:  HYSTEROSCOPY WITH D & C, CESAREAN SECTION x3, depression Sexual abuse: No  BOWEL MOVEMENT: Pain with bowel movement: No Type of bowel movement:Type (Bristol Stool Scale) 4, Frequency daily, and Strain 4 Fully empty rectum: Yes:   Leakage: No Pads: No Fiber supplement/laxative No  URINATION: Pain with urination: No Fully empty bladder: Yes:   Stream: Strong Urgency: Yes  but mostly just once in a bathroom (once sees a bathroom  or is cold or asked about a bathroom needs to go immediately)  Frequency: sometimes every 45 mins sometimes can space it out if not drinking a lot; 2-3x nightly  Leakage: Walking to the bathroom Pads: No  INTERCOURSE:  Ability to have vaginal penetration No  Pain with intercourse: Initial Penetration DrynessYes sometimes Climax: not painful Marinoff Scale: 1/3 Doesn't use a lubricant usually - reports her skin is very sensitive   PREGNANCY: Vaginal deliveries 0  C-section deliveries 3 Currently pregnant No  PROLAPSE: Pressure    OBJECTIVE:  Note: Objective measures were completed at Evaluation unless otherwise noted.  DIAGNOSTIC FINDINGS:    PATIENT SURVEYS:    PFIQ-7: bladder - 26  COGNITION: Overall cognitive status: Within functional limits for tasks assessed     SENSATION: Light touch: Appears intact  LUMBAR SPECIAL TESTS:  Single leg stance test: hip drop noted bil with 2-3s stance on either leg, SI Compression/distraction test: Negative, and FABER test: Negative  FUNCTIONAL TESTS:  Functional squat - bil knee valgus, trunk flexion, hands required at thighs to return to standing and decreased descent by 50%  GAIT: WFL  POSTURE: rounded shoulders, forward head, and posterior pelvic tilt   LUMBARAROM/PROM:  A/PROM A/PROM  eval  Flexion Limited by 25%  Extension WFL  Right lateral flexion Limited by 25%  Left lateral flexion Limited by 25%  Right rotation Limited by 25%  Left rotation Limited by 25%   (Blank rows = not tested)  LOWER EXTREMITY ROM:  Bil hip ER,IR, hamstrings Limited by 25%  LOWER EXTREMITY MMT:  Bil hips grossly 3+/5 except flexion 4/5; knees 4+/5 PALPATION:   General: tightness noted in bil paraspinals at lumbar and thoracic spine; tightness noted in bil piriformis   Pelvic Alignment: WFL  Abdominal: no TTP but does have fascial restriction in lower abdominal quadrants, restrictions in c-section noted in all directions  with mild TTP                  External Perineal Exam: mild dryness noted and slight redness  at vulva  Reports she feels the need to wipe herself several times daily feeling wet but reports she feels like she needs to be clean. Denies urine leakage with all cleaning, just sometimes "needs to wipe to be clean throughout the day".                              Internal Pelvic Floor: no TTP but reports feeling pressure at rectum with palpation of posterior pelvic floor  Patient confirms identification and approves PT to assess internal pelvic floor and treatment Yes  PELVIC MMT:   MMT eval  Vaginal 1-2/5; 1s; 2 reps  Internal Anal Sphincter   External Anal Sphincter   Puborectalis   Diastasis Recti   (Blank rows = not tested)        TONE: increased   PROLAPSE: Not seen in hooklying with cough   TODAY'S TREATMENT:                                                                                                                              DATE:   10/16/23 EVAL Examination completed, findings reviewed, pt educated on POC, HEP. Pt motivated to participate in PT and agreeable to attempt recommendations.    10/17/23: Pt educated on urge drill and handout given  Diaphragmatic breathing 3x10 with cues for pelvic floor down training Butterfly stretch 2x30s Windshield wipers x10  Cat/cow x10 Earline Glenn pose 3x30s Open books x10 each Sitting on yoga ball - x10 pelvic tilts, x10 circles each way Sitting on yoga ball: 2x10 pelvic floor contractions, 2x10 quick flicks, 5x5s isometrics (reports unable to hold the full 5s usually losses it around 3s  11/07/23: 2x10 diaphragmatic breathing in hooklying with pelvic floor contraction 2x10 hooklying hand ball squeeze with pelvic floor contraction and exhale 2x10 same side hand/knee ball squeeze in hooklying  Farmer's carry 750' 10# each hand  PATIENT EDUCATION:  Education details: [email or web address] Person educated: Patient Education method: Explanation,  Demonstration, Tactile cues, Verbal cues, and Handouts Education comprehension: verbalized understanding, returned demonstration, verbal cues required, tactile cues required, and needs further education  HOME EXERCISE PROGRAM: Access Code: [email or web address] URL: [email or web address] Date: 10/17/2023 Prepared by: Fairy Homer  Exercises - Supine Diaphragmatic Breathing  - 1 x daily - 7 x weekly - 3 sets - 10 reps - Supine Pelvic Floor Contraction  - 1 x daily - 7 x weekly - 3 sets - 10 reps - Sidelying Thoracic Rotation with Open Book  - 1 x daily - 7 x weekly - 1 sets - 10 reps - Supported Teacher, music with Pelvic Floor Relaxation  - 1 x daily - 7 x weekly - 1 sets - 3 reps - 30s holds - Seated Happy Baby With Trunk Flexion For Pelvic Relaxation  - 1 x daily - 7 x weekly - 1 sets - 3 reps - 30s holds - Pelvic Circles on [employer]  -  1 x daily - 7 x weekly - 2 sets - 10 reps  ASSESSMENT:  CLINICAL IMPRESSION: Patient is a 43 y.o. female  who was seen today for physical therapy treatment for mixed incontinence. Session focused on beginner core and hip strengthening with emphasis on coordination of pelvic floor and breathing with these. Pt reports she did feels contraction at pelvic floor better now and did tolerate well.  Pt would benefit from additional PT to further address deficits.     OBJECTIVE IMPAIRMENTS: decreased activity tolerance, decreased coordination, decreased endurance, decreased mobility, decreased ROM, decreased strength, increased fascial restrictions, increased muscle spasms, impaired flexibility, improper body mechanics, postural dysfunction, and pain.   ACTIVITY LIMITATIONS: carrying, squatting, continence, and locomotion level  PARTICIPATION LIMITATIONS: interpersonal relationship and community activity  PERSONAL FACTORS: Fitness, Time since onset of injury/illness/exacerbation, and 1 comorbidity: medical history  are also affecting patient's functional outcome.    REHAB POTENTIAL: Good  CLINICAL DECISION MAKING: Stable/uncomplicated  EVALUATION COMPLEXITY: Low   GOALS: Goals reviewed with patient? Yes  SHORT TERM GOALS: Target date: 11/13/23  Pt to be I with HEP for carry over and continuing recommendations for improved outcomes.   Baseline: Goal status: INITIAL  2.  Pt will have 25% less urgency due to bladder retraining and strengthening for improved ability to leave the house without needing to use public bathrooms per pt's personal preference.  Baseline:  Goal status: INITIAL  3.  Pt to report improved time between bladder voids to at least 2 hours for improved QOL with decreased urinary frequency.   Baseline:  Goal status: INITIAL  4.  Pt will be independent with the knack, urge suppression technique, and double voiding in order to improve bladder habits and decrease urinary incontinence.   Baseline:  Goal status: INITIAL   LONG TERM GOALS: Target date: 04/16/24  Pt to be I with advanced HEP for carry over and continuing recommendations for improved outcomes.   Baseline:  Goal status: INITIAL  2.  Pt will have 50% less urgency due to bladder retraining and strengthening for improved QOL and decreased urinary frequency.   Baseline:  Goal status: INITIAL  3.  Pt to report improved time between bladder voids to at least 3 hours for improved QOL with decreased urinary frequency.   Baseline:  Goal status: INITIAL  4.  Pt to demonstrate improved coordination of pelvic floor and breathing mechanics with 10# squat with appropriate synergistic patterns to decrease pain and leakage at least 75% of the time for improved ability to complete a 30 minute workout with strain at pelvic floor and symptoms.    Baseline:  Goal status: INITIAL  5.  Pt to demonstrate full mobility in trunk in all directions without pain for decreased strain at pelvis for improved tolerance to walking at least 30 mins without pain.  Baseline:  Goal status:  INITIAL  6.  Pt to report no more than 1 instance of urinary incontinence within one month for improved QOL and skin integrity.  Baseline:  Goal status: INITIAL  PLAN:  PT FREQUENCY: 2x/week  PT DURATION: 12 sessions  PLANNED INTERVENTIONS: 97110-Therapeutic exercises, 97530- Therapeutic activity, [id number]- Neuromuscular re-education, 97535- Self Care, 16109- Manual therapy, [phone]- Aquatic Therapy, [phone]- Electrical stimulation (manual), 97016- Vasopneumatic device, Patient/Family education, Taping, Dry Needling, Joint mobilization, Spinal mobilization, Scar mobilization, DME instructions, Cryotherapy, Moist heat, and Biofeedback  PLAN FOR NEXT SESSION: internal as needed and pt consents, coordination of pelvic floor and breathing and activity, hip and core strengthening,  breathing and voiding mechanics,   Avie Lemme, PT, DPT 05/14/254:59 PM   PHYSICAL THERAPY DISCHARGE SUMMARY  Visits from Start of Care: 3  Current functional level related to goals / functional outcomes: Pt reached out to clinic and requested discharge due to her feeling better   Remaining deficits: Pt reports she is feeling better   Education / Equipment: HEP   Patient agrees to discharge. Patient goals were partially met. Patient is being discharged due to being pleased with the current functional level.  Avie Lemme, PT, DPT 11/28/2509:37 AM

## 2023-11-14 ENCOUNTER — Encounter: Payer: Self-pay | Admitting: Physical Therapy

## 2023-11-14 ENCOUNTER — Telehealth: Payer: Self-pay | Admitting: Physical Therapy

## 2023-11-15 ENCOUNTER — Ambulatory Visit: Admitting: Physical Therapy

## 2023-11-26 ENCOUNTER — Encounter: Payer: Self-pay | Admitting: Nurse Practitioner

## 2023-11-27 ENCOUNTER — Other Ambulatory Visit: Payer: Self-pay

## 2023-11-27 DIAGNOSIS — K089 Disorder of teeth and supporting structures, unspecified: Secondary | ICD-10-CM

## 2023-11-27 DIAGNOSIS — Z0100 Encounter for examination of eyes and vision without abnormal findings: Secondary | ICD-10-CM

## 2023-12-03 ENCOUNTER — Ambulatory Visit: Admitting: Physical Therapy

## 2023-12-06 ENCOUNTER — Ambulatory Visit: Admitting: Physical Therapy

## 2023-12-10 ENCOUNTER — Encounter: Admitting: Physical Therapy

## 2023-12-12 ENCOUNTER — Encounter: Admitting: Physical Therapy

## 2023-12-17 ENCOUNTER — Encounter: Admitting: Physical Therapy

## 2023-12-20 ENCOUNTER — Encounter: Admitting: Physical Therapy

## 2023-12-24 ENCOUNTER — Encounter: Admitting: Physical Therapy

## 2023-12-27 ENCOUNTER — Ambulatory Visit (INDEPENDENT_AMBULATORY_CARE_PROVIDER_SITE_OTHER)

## 2023-12-27 ENCOUNTER — Ambulatory Visit: Admitting: Podiatry

## 2023-12-27 ENCOUNTER — Encounter: Admitting: Physical Therapy

## 2023-12-27 ENCOUNTER — Encounter: Payer: Self-pay | Admitting: Podiatry

## 2023-12-27 VITALS — Ht 65.0 in | Wt 196.0 lb

## 2023-12-27 DIAGNOSIS — M7751 Other enthesopathy of right foot: Secondary | ICD-10-CM | POA: Diagnosis not present

## 2023-12-27 DIAGNOSIS — M21611 Bunion of right foot: Secondary | ICD-10-CM | POA: Diagnosis not present

## 2023-12-27 DIAGNOSIS — M7752 Other enthesopathy of left foot: Secondary | ICD-10-CM

## 2023-12-27 DIAGNOSIS — M722 Plantar fascial fibromatosis: Secondary | ICD-10-CM | POA: Diagnosis not present

## 2023-12-27 MED ORDER — TRIAMCINOLONE ACETONIDE 10 MG/ML IJ SUSP
10.0000 mg | Freq: Once | INTRAMUSCULAR | Status: AC
Start: 2023-12-27 — End: 2023-12-27
  Administered 2023-12-27: 10 mg via INTRA_ARTICULAR

## 2023-12-27 NOTE — Progress Notes (Signed)
 Subjective:   Patient ID: Roberta Reynolds, female   DOB: 43 y.o.   MRN: [id number]   HPI Patient presents stating she is getting pain around both heels and into the ankles that occur with ambulation.  States she does not remember specific injury and also has a large structural bunion deformity right which can be bothersome at times   ROS      Objective:  Physical Exam  Neurovascular status intact with exquisite discomfort of the medial fascial band right over left and mild discomfort into the sinus tarsi bilateral which is new with a large hyperostosis medial aspect first metatarsal head right foot painful when pressed with redness     Assessment:  Acute plantar fasciitis right over left moderate ankle pain that seems new to her and structural bunion deformity right over left     Plan:  H&P all conditions reviewed and I went ahead today I am going to focus again on the heels and I did sterile prep and injected the plantar fascia bilateral 3 mg Kenalog  5 mg Xylocaine  and applied sterile dressings.  I then discussed ankle hopefully that gets better on its own and I reviewed x-rays and I discussed bunion and we did discuss bunion correction which can be done and most likely a distal osteotomy could be done but do not recommend currently unless it gets worse.  I also recommended her to get night splints with education on how to use these properly  Multiple view x-rays indicate no signs of fracture or diastases injury or other pathology

## 2023-12-31 ENCOUNTER — Encounter: Admitting: Physical Therapy

## 2024-01-02 ENCOUNTER — Encounter: Admitting: Physical Therapy

## 2024-01-07 ENCOUNTER — Encounter: Admitting: Physical Therapy

## 2024-01-10 ENCOUNTER — Encounter: Admitting: Physical Therapy

## 2024-02-23 ENCOUNTER — Other Ambulatory Visit: Payer: Self-pay | Admitting: Nurse Practitioner

## 2024-02-23 DIAGNOSIS — K219 Gastro-esophageal reflux disease without esophagitis: Secondary | ICD-10-CM

## 2024-02-25 NOTE — Telephone Encounter (Signed)
 Requested Prescriptions  Pending Prescriptions Disp Refills   omeprazole  (PRILOSEC) 40 MG capsule [Pharmacy Med Name: OMEPRAZOLE  DR 40 MG CAPSULE] 90 capsule 1    Sig: Take 1 capsule (40 mg total) by mouth daily. OFFICE VISIT NEEDED FOR ADDITIONAL REFILLS     Gastroenterology: Proton Pump Inhibitors Passed - 02/25/2024  9:43 AM      Passed - Valid encounter within last 12 months    Recent Outpatient Visits           6 months ago Frequent urination   [facility] [place]. [facility] Theotis Haze ORN, NP   11 months ago Carpal tunnel syndrome, bilateral   [facility] [place]. [facility] Theotis Haze ORN, NP   1 year ago Intertrigo   [facility] [place]. [facility] Theotis Haze ORN, NP   2 years ago Encounter for Papanicolaou smear for cervical cancer screening   [facility] [place] [facility] [facility] [place]. [facility] Theotis Haze ORN, NP   2 years ago Cough in adult   [facility] [place] [facility] Of [place]. [facility] Theotis Haze ORN, [place]

## 2024-04-11 ENCOUNTER — Encounter: Payer: Self-pay | Admitting: Family Medicine

## 2024-04-11 ENCOUNTER — Ambulatory Visit: Attending: Nurse Practitioner | Admitting: Family Medicine

## 2024-04-11 ENCOUNTER — Other Ambulatory Visit: Payer: Self-pay | Admitting: Family Medicine

## 2024-04-11 VITALS — BP 99/64 | HR 56 | Temp 98.3°F | Ht 65.0 in | Wt 186.8 lb

## 2024-04-11 DIAGNOSIS — N644 Mastodynia: Secondary | ICD-10-CM

## 2024-04-11 NOTE — Patient Instructions (Signed)
 VISIT SUMMARY:  You came in today because of pain in your right breast that you have been experiencing for the past two days. The pain started as a hard, movable, tender area and has since lessened, but you still feel some discomfort. Given your family history of breast cancer, we are taking this seriously, even though your last mammogram in April showed no signs of cancer.  YOUR PLAN:  -RIGHT BREAST PAIN: Right breast pain can be caused by various factors, including benign conditions like cysts or infections, but it is important to rule out more serious causes such as breast cancer, especially given your family history. We will order a diagnostic mammogram and a breast ultrasound to get a clearer picture of what might be causing the pain. Please follow up with your primary care provider after the imaging tests are completed.  INSTRUCTIONS:  Please schedule and complete the diagnostic mammogram and breast ultrasound as soon as possible. After the imaging tests, follow up with your primary care provider to discuss the results and any further steps.

## 2024-04-11 NOTE — Progress Notes (Signed)
 Subjective:  Patient ID: Roberta Reynolds, female    DOB: 01-04-1981  Age: 43 y.o. MRN: [id number]  CC: Breast Pain     Discussed the use of AI scribe software for clinical note transcription with the patient, who gave verbal consent to proceed.  History of Present Illness Roberta Reynolds is a 43 year old female patient of Zelda Fleming, FNP, who presents with right breast pain.  She has experienced right breast pain for two days, initially presenting as a hard, movable, tender area on the lateral aspect of the right breast the pain persisted for a day and a half before subsiding, leaving some residual discomfort. She is concerned due to a family history of breast cancer involving cousins and an aunt. Her last mammogram in April showed no evidence of malignancy. A previous procedure involved placing a clip in her right breast, and the current pain is in the same location. During breastfeeding her first child, she had left axillary lymphadenopathy that resolved. She has not recently used birth control pills.   Past Medical History:  Diagnosis Date   Complication of anesthesia    spinal heachache wants general anesthesia for CS   Depression    Dyspnea    GERD (gastroesophageal reflux disease)    Hypercholesteremia 05/30/2021   Spinal headache    for four days    Past Surgical History:  Procedure Laterality Date   BREAST BIOPSY Right 07/28/2022   US  RT BREAST BX W LOC DEV 1ST LESION IMG BX SPEC US  GUIDE 07/28/2022 [facility]MO[facility]AP[facility]   CESAREAN SECTION     CESAREAN SECTION N/A 06/18/2018   Procedure: CESAREAN SECTION;  Surgeon: Izell Harari, MD;  Location: [facility];  Service: Obstetrics;  Laterality: N/A;   CESAREAN SECTION     CHOLECYSTECTOMY N/A 11/05/2019   Procedure: LAPAROSCOPIC CHOLECYSTECTOMY WITH INTRAOPERATIVE CHOLANGIOGRAM;  Surgeon: Vernetta Berg, MD;  Location: [facility];  Service: General;  Laterality: N/A;   HYSTEROSCOPY WITH D & C N/A 08/31/2021    Procedure: DILATATION AND CURETTAGE /HYSTEROSCOPY;  Surgeon: Herchel Gloris LABOR, MD;  Location: [facility];  Service: Gynecology;  Laterality: N/A;    Family History  Problem Relation Age of Onset   Cancer Maternal Aunt    Cancer Paternal Uncle    Hypertension Father    Breast cancer Paternal Aunt    Breast cancer Cousin     Social History   Socioeconomic History   Marital status: Married    Spouse name: Not on file   Number of children: 3   Years of education: 16   Highest education level: Bachelor's degree (e.g., BA, AB, BS)  Occupational History   Not on file  Tobacco Use   Smoking status: Never   Smokeless tobacco: Never  Vaping Use   Vaping status: Never Used  Substance and Sexual Activity   Alcohol use: No   Drug use: No   Sexual activity: Yes    Partners: Male  Other Topics Concern   Not on file  Social History Narrative   Not on file   Social Drivers of Health   Financial Resource Strain: Low Risk  (02/19/2023)   Overall Financial Resource Strain (CARDIA)    Difficulty of Paying Living Expenses: Not hard at all  Food Insecurity: No Food Insecurity (04/11/2024)   Hunger Vital Sign    Worried About Running Out of Food in the Last Year: Never true    Ran Out of Food in the Last Year:  Never true  Transportation Needs: No Transportation Needs (04/11/2024)   PRAPARE - Administrator, Civil Service (Medical): No    Lack of Transportation (Non-Medical): No  Physical Activity: Sufficiently Active (02/19/2023)   Exercise Vital Sign    Days of Exercise per Week: 3 days    Minutes of Exercise per Session: 60 min  Stress: No Stress Concern Present (02/19/2023)   [employer] of Occupational Health - Occupational Stress Questionnaire    Feeling of Stress : Not at all  Social Connections: Unknown (02/19/2023)   Social Connection and Isolation Panel    Frequency of Communication with Friends and Family: More than three times a week     Frequency of Social Gatherings with Friends and Family: Once a week    Attends Religious Services: Patient declined    Database administrator or Organizations: No    Attends Engineer, structural: Not on file    Marital Status: Married    Allergies  Allergen Reactions   Other Other (See Comments)    Continuous Epidural Tray- caused a Spinal headache and I could not stand    Chalk Itching   Sprintec 28 [Norgestimate -Eth Estradiol ] Other (See Comments)    Affected the libido negatively (low sexual drive)    Outpatient Medications Prior to Visit  Medication Sig Dispense Refill   PHEXXI  1.8-1-0.4 % GEL PLEASE SEE ATTACHED FOR DETAILED DIRECTIONS 5 g 20   meloxicam  (MOBIC ) 7.5 MG tablet Take 1 tablet (7.5 mg total) by mouth daily. With food (Patient not taking: Reported on 04/11/2024) 30 tablet 1   Multiple Vitamin (MULTI-VITAMIN) tablet Take 1 tablet by mouth daily. (Patient not taking: Reported on 04/11/2024)     omeprazole  (PRILOSEC) 40 MG capsule Take 1 capsule (40 mg total) by mouth daily. OFFICE VISIT NEEDED FOR ADDITIONAL REFILLS (Patient not taking: Reported on 04/11/2024) 90 capsule 1   No facility-administered medications prior to visit.     ROS Review of Systems  Constitutional:  Negative for activity change and appetite change.  HENT:  Negative for sinus pressure and sore throat.   Respiratory:  Negative for chest tightness, shortness of breath and wheezing.   Cardiovascular:  Negative for chest pain and palpitations.  Gastrointestinal:  Negative for abdominal distention, abdominal pain and constipation.  Genitourinary: Negative.   Musculoskeletal: Negative.   Psychiatric/Behavioral:  Negative for behavioral problems and dysphoric mood.     Objective:  BP 99/64   Pulse (!) 56   Temp 98.3 F (36.8 C) (Oral)   Ht 5' 5 (1.651 m)   Wt 186 lb 12.8 oz (84.7 kg)   SpO2 100%   BMI 31.09 kg/m      04/11/2024   10:05 AM 12/27/2023    8:56 AM 09/06/2023    10:14 AM  BP/Weight  Systolic BP 99  105  Diastolic BP 64  70  Wt. (Lbs) 186.8 196 196  BMI 31.09 kg/m2 32.62 kg/m2 32.62 kg/m2      Physical Exam Constitutional:      Appearance: She is well-developed.  Cardiovascular:     Rate and Rhythm: Bradycardia present.     Heart sounds: Normal heart sounds. No murmur heard. Pulmonary:     Effort: Pulmonary effort is normal.     Breath sounds: Normal breath sounds. No wheezing or rales.  Chest:     Chest wall: No tenderness.  Breasts:    Right: Tenderness (lateral aspect of R breast) present. No mass or skin change.  Left: No mass, skin change or tenderness.  Abdominal:     General: Bowel sounds are normal. There is no distension.     Palpations: Abdomen is soft. There is no mass.     Tenderness: There is no abdominal tenderness.  Musculoskeletal:        General: Normal range of motion.     Right lower leg: No edema.     Left lower leg: No edema.  Lymphadenopathy:     Cervical: No cervical adenopathy.     Upper Body:     Right upper body: No supraclavicular or axillary adenopathy.     Left upper body: No supraclavicular or axillary adenopathy.  Neurological:     Mental Status: She is alert and oriented to person, place, and time.  Psychiatric:        Mood and Affect: Mood normal.        Latest Ref Rng & Units 08/07/2023   12:18 PM 11/17/2021    4:14 PM 03/25/2021    2:07 PM  CMP  Glucose 70 - 99 mg/dL 72  88  79   BUN 6 - 24 mg/dL 9  14  6    Creatinine 0.57 - 1.00 mg/dL 9.42  9.39  9.31   Sodium 134 - 144 mmol/L 140  137  140   Potassium 3.5 - 5.2 mmol/L 4.4  4.2  4.4   Chloride 96 - 106 mmol/L 101  101  102   CO2 20 - 29 mmol/L 21  22  23    Calcium 8.7 - 10.2 mg/dL 9.6  9.3  9.7   Total Protein 6.0 - 8.5 g/dL 7.1   7.0   Total Bilirubin 0.0 - 1.2 mg/dL 0.4   0.3   Alkaline Phos 44 - 121 IU/L 51   47   AST 0 - 40 IU/L 16   16   ALT 0 - 32 IU/L 15   14     Lipid Panel     Component Value Date/Time   CHOL 227  (H) 08/07/2023 1218   TRIG 148 08/07/2023 1218   HDL 50 08/07/2023 1218   CHOLHDL 4.5 (H) 08/07/2023 1218   LDLCALC 150 (H) 08/07/2023 1218    CBC    Component Value Date/Time   WBC 7.4 08/07/2023 1218   WBC 6.5 08/23/2021 1218   RBC 5.52 (H) 08/07/2023 1218   RBC 5.22 (H) 08/23/2021 1218   HGB 12.7 08/07/2023 1218   HCT 40.5 08/07/2023 1218   PLT 344 08/07/2023 1218   MCV 73 (L) 08/07/2023 1218   MCH 23.0 (L) 08/07/2023 1218   MCH 23.0 (L) 08/23/2021 1218   MCHC 31.4 (L) 08/07/2023 1218   MCHC 32.5 08/23/2021 1218   RDW 14.8 08/07/2023 1218   LYMPHSABS 3.0 08/07/2023 1218   EOSABS 0.2 08/07/2023 1218   BASOSABS 0.0 08/07/2023 1218    Lab Results  Component Value Date   HGBA1C 5.9 (H) 08/07/2023       Assessment & Plan Right breast pain Acute right breast pain with a hard, movable mass per patient but this is not palpable on exam but she has tenderness. Family history of breast cancer noted. Previous mammogram in April was negative for malignancy. - Order diagnostic mammogram and breast ultrasound. - Advise follow-up with primary care provider post-imaging.       No orders of the defined types were placed in this encounter.   Follow-up: Return in about 6 weeks (around 05/23/2024) for Breast symptoms with  PCP.       Corrina Sabin, MD, FAAFP. [facility] [place], [place] [phone]   04/11/2024, 12:47 PM

## 2024-04-30 ENCOUNTER — Ambulatory Visit

## 2024-04-30 ENCOUNTER — Ambulatory Visit: Payer: Self-pay | Admitting: Family Medicine

## 2024-04-30 ENCOUNTER — Ambulatory Visit
Admission: RE | Admit: 2024-04-30 | Discharge: 2024-04-30 | Disposition: A | Discharge status: Home / Self Care | Source: Ambulatory Visit | Attending: Family Medicine | Admitting: Family Medicine

## 2024-04-30 DIAGNOSIS — N644 Mastodynia: Secondary | ICD-10-CM

## 2024-05-26 ENCOUNTER — Encounter: Payer: Self-pay | Admitting: Nurse Practitioner

## 2024-05-26 ENCOUNTER — Ambulatory Visit: Payer: Self-pay | Attending: Nurse Practitioner | Admitting: Nurse Practitioner

## 2024-05-26 VITALS — BP 109/75 | HR 67 | Resp 19 | Ht 65.0 in | Wt 187.6 lb

## 2024-05-26 DIAGNOSIS — G8929 Other chronic pain: Secondary | ICD-10-CM

## 2024-05-26 DIAGNOSIS — M25572 Pain in left ankle and joints of left foot: Secondary | ICD-10-CM

## 2024-05-26 DIAGNOSIS — R7989 Other specified abnormal findings of blood chemistry: Secondary | ICD-10-CM

## 2024-05-26 DIAGNOSIS — M26629 Arthralgia of temporomandibular joint, unspecified side: Secondary | ICD-10-CM

## 2024-05-26 DIAGNOSIS — E7800 Pure hypercholesterolemia, unspecified: Secondary | ICD-10-CM

## 2024-05-26 DIAGNOSIS — M25579 Pain in unspecified ankle and joints of unspecified foot: Secondary | ICD-10-CM

## 2024-05-26 DIAGNOSIS — R7303 Prediabetes: Secondary | ICD-10-CM

## 2024-05-26 MED ORDER — IBUPROFEN 800 MG PO TABS: 800.0000 mg | ORAL_TABLET | Freq: Three times a day (TID) | ORAL | 0 refills | Status: AC | PRN

## 2024-05-26 NOTE — Progress Notes (Signed)
 Assessment & Plan:  [name] was seen today for ankle pain and jaw pain.  Diagnoses and all orders for this visit:  Prediabetes -     CMP14+EGFR -     Hemoglobin A1c A1c previously in the prediabetes range. She is on a diet and medication to aid weight loss and reduce cravings. Regular exercise is being performed. - Ordered A1c test to assess current status. - Continue current diet and exercise regimen.  Abnormal CBC -     CBC with Differential/Platelet  TMJ pain dysfunction syndrome -     ibuprofen  (ADVIL ) 800 MG tablet; Take 1 tablet (800 mg total) by mouth every 8 (eight) hours as needed. Jaw pain localized to TMJ area, likely due to clenching or grinding teeth. Symptoms include pain when chewing gum. - Recommended use of a mouth guard for TMJ, available online. - Prescribed Motrin  for pain management. - Provided information on TMJ exercises.  Hypercholesterolemia -     Lipid panel  Joint pain of ankle and foot, unspecified laterality Chronic pain in the ankle and left foot, worsening despite previous injections. Discussed potential foot surgery and its safety. Prediabetes not a risk factor for infection post-surgery. - Discuss surgical options and potential outcomes with the specialist. - Prepare questions for the specialist regarding the surgery, including potential for pain recurrence and recovery time.   Patient has been counseled on age-appropriate routine health concerns for screening and prevention. These are reviewed and up-to-date. Referrals have been placed accordingly. Immunizations are up-to-date or declined.    Subjective:   Chief Complaint  Patient presents with   Ankle Pain    No relief when going to foot doctor.   Jaw Pain    Right    History of Present Illness Roberta Reynolds is a 43 year old female who presents with ankle pain and consideration for foot surgery.  She experiences persistent bilateral ankle, foot and heel pain, which has been managed  with injections providing temporary relief for about three weeks each time. She is being followed by Podiatry. Wants to know if she would be a good surgical candidate for foot surgery.   Prediabetes She is actively trying to lose weight to reduce pressure on her heels, engaging in regular exercise and dietary changes, including reducing carbohydrate intake due to her prediabetes. She is on a medication that helps reduce cravings, especially for sweets. Lab Results  Component Value Date   HGBA1C 5.9 (H) 08/07/2023     She experiences right sided jaw pain. Chewing gum provides some relief. She has not yet consulted a dentist for this issue.  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 Tylenol  is used for pain management but is insufficient. Her menstrual cycle is currently regular. But has been irregular in the past year  Review of Systems  Constitutional:  Negative for fever, malaise/fatigue and weight loss.  HENT: Negative.  Negative for nosebleeds.        Jaw pain  Eyes: Negative.  Negative for blurred vision, double vision and photophobia.  Respiratory: Negative.  Negative for cough and shortness of breath.   Cardiovascular: Negative.  Negative for chest pain, palpitations and leg swelling.  Gastrointestinal: Negative.  Negative for heartburn, nausea and vomiting.  Musculoskeletal:  Positive for joint pain. Negative for myalgias.  Neurological:  Positive for headaches. Negative for dizziness, focal weakness and seizures.  Psychiatric/Behavioral: Negative.  Negative for suicidal ideas.  Past Medical History:  Diagnosis Date   Complication of anesthesia    spinal heachache wants general anesthesia for CS   Depression    Dyspnea    GERD (gastroesophageal reflux disease)    Hypercholesteremia 05/30/2021   Spinal headache    for four days    Past Surgical History:  Procedure Laterality Date    BREAST BIOPSY Right 07/28/2022   US  RT BREAST BX W LOC DEV 1ST LESION IMG BX SPEC US  GUIDE 07/28/2022 [facility] MAMMOGRAPHY   CESAREAN SECTION     CESAREAN SECTION N/A 06/18/2018   Procedure: CESAREAN SECTION;  Surgeon: Izell Harari, MD;  Location: [facility];  Service: Obstetrics;  Laterality: N/A;   CESAREAN SECTION     CHOLECYSTECTOMY N/A 11/05/2019   Procedure: LAPAROSCOPIC CHOLECYSTECTOMY WITH INTRAOPERATIVE CHOLANGIOGRAM;  Surgeon: Vernetta Berg, MD;  Location: [facility];  Service: General;  Laterality: N/A;   HYSTEROSCOPY WITH D & C N/A 08/31/2021   Procedure: DILATATION AND CURETTAGE /HYSTEROSCOPY;  Surgeon: Herchel Gloris LABOR, MD;  Location: [facility];  Service: Gynecology;  Laterality: N/A;    Family History  Problem Relation Age of Onset   Cancer Maternal Aunt    Cancer Paternal Uncle    Hypertension Father    Breast cancer Paternal Aunt    Breast cancer Cousin     Social History Reviewed with no changes to be made today.   Outpatient Medications Prior to Visit  Medication Sig Dispense Refill   PHEXXI  1.8-1-0.4 % GEL PLEASE SEE ATTACHED FOR DETAILED DIRECTIONS 5 g 20   meloxicam  (MOBIC ) 7.5 MG tablet Take 1 tablet (7.5 mg total) by mouth daily. With food (Patient not taking: Reported on 05/26/2024) 30 tablet 1   Multiple Vitamin (MULTI-VITAMIN) tablet Take 1 tablet by mouth daily. (Patient not taking: Reported on 05/26/2024)     omeprazole  (PRILOSEC) 40 MG capsule Take 1 capsule (40 mg total) by mouth daily. OFFICE VISIT NEEDED FOR ADDITIONAL REFILLS (Patient not taking: Reported on 05/26/2024) 90 capsule 1   No facility-administered medications prior to visit.    Allergies  Allergen Reactions   Other Other (See Comments)    Continuous Epidural Tray- caused a Spinal headache and I could not stand    Chalk Itching   Sprintec 28 [Norgestimate -Eth Estradiol ] Other (See Comments)    Affected the libido negatively (low sexual drive)        Objective:    BP 109/75 (BP Location: Left Arm, Patient Position: Sitting, Cuff Size: Normal)   Pulse 67   Resp 19   Ht 5' 5 (1.651 m)   Wt 187 lb 9.6 oz (85.1 kg)   LMP 05/24/2024 (Exact Date)   SpO2 100%   BMI 31.22 kg/m  Wt Readings from Last 3 Encounters:  05/26/24 187 lb 9.6 oz (85.1 kg)  04/11/24 186 lb 12.8 oz (84.7 kg)  12/27/23 196 lb (88.9 kg)    Physical Exam Vitals and nursing note reviewed.  Constitutional:      Appearance: She is well-developed.  HENT:     Head: Normocephalic and atraumatic.  Cardiovascular:     Rate and Rhythm: Normal rate and regular rhythm.     Heart sounds: Normal heart sounds. No murmur heard.    No friction rub. No gallop.  Pulmonary:     Effort: Pulmonary effort is normal. No tachypnea or respiratory distress.     Breath sounds: Normal breath sounds. No decreased breath sounds, wheezing, rhonchi or rales.  Chest:  Chest wall: No tenderness.  Musculoskeletal:        General: Normal range of motion.     Cervical back: Normal range of motion.  Skin:    General: Skin is warm and dry.  Neurological:     Mental Status: She is alert and oriented to person, place, and time.     Coordination: Coordination normal.  Psychiatric:        Behavior: Behavior normal. Behavior is cooperative.        Thought Content: Thought content normal.        Judgment: Judgment normal.          Patient has been counseled extensively about nutrition and exercise as well as the importance of adherence with medications and regular follow-up. The patient was given clear instructions to go to ER or return to medical center if symptoms don't improve, worsen or new problems develop. The patient verbalized understanding.   Follow-up: Return if symptoms worsen or fail to improve.   Haze LELON Servant, FNP-BC [facility] and [facility] [place], [place] [phone]   05/26/2024, 5:40 PM

## 2024-05-26 NOTE — Telephone Encounter (Signed)
 Called patient 5/21 re missed PT visit, just adding a later date note

## 2024-05-27 ENCOUNTER — Ambulatory Visit: Payer: Self-pay | Admitting: Nurse Practitioner

## 2024-05-27 LAB — CMP14+EGFR
ALT: 13 IU/L (ref 0–32)
AST: 16 IU/L (ref 0–40)
Albumin: 4.5 g/dL (ref 3.9–4.9)
Alkaline Phosphatase: 51 IU/L (ref 41–116)
BUN/Creatinine Ratio: 19 (ref 9–23)
BUN: 13 mg/dL (ref 6–24)
Bilirubin Total: 0.3 mg/dL (ref 0.0–1.2)
CO2: 21 mmol/L (ref 20–29)
Calcium: 9.5 mg/dL (ref 8.7–10.2)
Chloride: 102 mmol/L (ref 96–106)
Creatinine, Ser: 0.7 mg/dL (ref 0.57–1.00)
Globulin, Total: 2.3 g/dL (ref 1.5–4.5)
Glucose: 80 mg/dL (ref 70–99)
Potassium: 4.8 mmol/L (ref 3.5–5.2)
Sodium: 139 mmol/L (ref 134–144)
Total Protein: 6.8 g/dL (ref 6.0–8.5)
eGFR: 109 mL/min/1.73 (ref >59)

## 2024-05-27 LAB — CBC WITH DIFFERENTIAL/PLATELET
Basophils Absolute: 0.1 x10E3/uL (ref 0.0–0.2)
Basos: 1 %
EOS (ABSOLUTE): 0.2 x10E3/uL (ref 0.0–0.4)
Eos: 3 %
Hematocrit: 40.4 % (ref 34.0–46.6)
Hemoglobin: 12.5 g/dL (ref 11.1–15.9)
Immature Grans (Abs): 0 x10E3/uL (ref 0.0–0.1)
Immature Granulocytes: 0 %
Lymphocytes Absolute: 3 x10E3/uL (ref 0.7–3.1)
Lymphs: 38 %
MCH: 22.9 pg — ABNORMAL LOW (ref 26.6–33.0)
MCHC: 30.9 g/dL — ABNORMAL LOW (ref 31.5–35.7)
MCV: 74 fL — ABNORMAL LOW (ref 79–97)
Monocytes Absolute: 0.6 x10E3/uL (ref 0.1–0.9)
Monocytes: 7 %
Neutrophils Absolute: 3.9 x10E3/uL (ref 1.4–7.0)
Neutrophils: 51 %
Platelets: 335 x10E3/uL (ref 150–450)
RBC: 5.45 x10E6/uL — ABNORMAL HIGH (ref 3.77–5.28)
RDW: 15.6 % — ABNORMAL HIGH (ref 11.7–15.4)
WBC: 7.8 x10E3/uL (ref 3.4–10.8)

## 2024-05-27 LAB — LIPID PANEL
Chol/HDL Ratio: 3.9 ratio (ref 0.0–4.4)
Cholesterol, Total: 209 mg/dL — ABNORMAL HIGH (ref 100–199)
HDL: 53 mg/dL (ref >39)
LDL Chol Calc (NIH): 123 mg/dL — ABNORMAL HIGH (ref 0–99)
Triglycerides: 186 mg/dL — ABNORMAL HIGH (ref 0–149)
VLDL Cholesterol Cal: 33 mg/dL (ref 5–40)

## 2024-05-27 LAB — HEMOGLOBIN A1C
Est. average glucose Bld gHb Est-mCnc: 108 mg/dL
Hgb A1c MFr Bld: 5.4 % (ref 4.8–5.6)

## 2024-06-02 ENCOUNTER — Other Ambulatory Visit: Payer: Self-pay | Admitting: Obstetrics & Gynecology

## 2024-06-02 DIAGNOSIS — Z309 Encounter for contraceptive management, unspecified: Secondary | ICD-10-CM

## 2024-06-04 ENCOUNTER — Ambulatory Visit: Admitting: Podiatry

## 2024-06-04 ENCOUNTER — Encounter: Payer: Self-pay | Admitting: Podiatry

## 2024-06-04 DIAGNOSIS — M216X1 Other acquired deformities of right foot: Secondary | ICD-10-CM

## 2024-06-04 DIAGNOSIS — M722 Plantar fascial fibromatosis: Secondary | ICD-10-CM

## 2024-06-04 DIAGNOSIS — M216X2 Other acquired deformities of left foot: Secondary | ICD-10-CM

## 2024-06-04 NOTE — Progress Notes (Signed)
 Subjective:   Patient ID: Roberta Reynolds, female   DOB: 43 y.o.   MRN: [id number]   HPI Patient presents stating that her heel is really bothering her and is not responded conservatively.  Is with caregiver today   ROS      Objective:  Physical Exam  Neurovascular status intact with patient found to have inflammation and pain of the plantar heel right severe in nature  with fluid buildup and depression of the arch noted     Assessment:  Acute plantar fasciitis right so far not responding conservatively with patient having had injections but not using night splint like we had requested     Plan:  I reviewed with her at great length she is going to order night splint currently and at this time I did casted for functional orthotics to offload weight from her acquired foot deformity bilateral.  I then went ahead and discussed possible surgery reviewed endoscopic fascial surgery if she does not respond to ice night splint and orthotics all discussed with patient

## 2024-06-09 MED ORDER — PHEXXI 1.8-1-0.4 % VA GEL: VAGINAL | 11 refills | Status: AC

## 2024-06-30 ENCOUNTER — Other Ambulatory Visit (HOSPITAL_COMMUNITY): Payer: Self-pay

## 2024-06-30 DIAGNOSIS — Z309 Encounter for contraceptive management, unspecified: Secondary | ICD-10-CM

## 2024-06-30 MED ORDER — PHEXXI 1.8-1-0.4 % VA GEL
VAGINAL | 20 refills | Status: AC
  Filled 2024-06-30: qty 5, 1d supply, fill #0

## 2024-07-01 ENCOUNTER — Other Ambulatory Visit (HOSPITAL_COMMUNITY): Payer: Self-pay

## 2024-07-01 ENCOUNTER — Encounter: Payer: Self-pay | Admitting: Nurse Practitioner

## 2024-07-02 ENCOUNTER — Other Ambulatory Visit (HOSPITAL_COMMUNITY): Payer: Self-pay

## 2024-07-10 ENCOUNTER — Other Ambulatory Visit (HOSPITAL_COMMUNITY): Payer: Self-pay

## 2024-07-10 ENCOUNTER — Telehealth: Payer: Self-pay | Admitting: Podiatry

## 2024-07-21 ENCOUNTER — Other Ambulatory Visit

## 2024-07-29 ENCOUNTER — Emergency Department (HOSPITAL_BASED_OUTPATIENT_CLINIC_OR_DEPARTMENT_OTHER)

## 2024-07-29 ENCOUNTER — Encounter (HOSPITAL_BASED_OUTPATIENT_CLINIC_OR_DEPARTMENT_OTHER): Payer: Self-pay | Admitting: Emergency Medicine

## 2024-07-29 ENCOUNTER — Emergency Department (HOSPITAL_BASED_OUTPATIENT_CLINIC_OR_DEPARTMENT_OTHER)
Admission: EM | Admit: 2024-07-29 | Discharge: 2024-07-29 | Disposition: A | Discharge status: Home / Self Care | Attending: Emergency Medicine | Admitting: Emergency Medicine

## 2024-07-29 ENCOUNTER — Other Ambulatory Visit: Payer: Self-pay

## 2024-07-29 DIAGNOSIS — M25561 Pain in right knee: Secondary | ICD-10-CM

## 2024-07-29 DIAGNOSIS — M25461 Effusion, right knee: Secondary | ICD-10-CM | POA: Insufficient documentation

## 2024-07-29 DIAGNOSIS — M25469 Effusion, unspecified knee: Secondary | ICD-10-CM

## 2024-07-29 NOTE — Discharge Instructions (Signed)
 Please use Tylenol  or ibuprofen  for pain.  You may use 600 mg ibuprofen  every 6 hours or 1000 mg of Tylenol  every 6 hours.  You may choose to alternate between the 2.  This would be most effective.  Not to exceed 4 g of Tylenol  within 24 hours.  Not to exceed 3200 mg ibuprofen  24 hours.  At the very least I would use the ibuprofen , as we discussed this is an anti-inflammatory medication that is helping to decrease the inflammation at the wound and to promote healing.  I would take this for at least 1 to 2 weeks to promote the anti-inflammatory healing effect. You can use the knee immobilizer while walking to give your knee extra stability since the injury has led to some instability at the joint.  If you are not having any improvement in your symptoms after an additional 1 to 2 weeks of resting, anti-inflammatories, ice, and keeping it elevated when you are not walking then I would contact the orthopedic physician for further evaluation.

## 2024-07-29 NOTE — ED Triage Notes (Signed)
 Pt endorses mechanical fall x 5 days pta. Pt c/o RT knee and lower leg pain with knee  swelling and decreased ROM. Pt wearing knee brace from home

## 2024-09-30 ENCOUNTER — Ambulatory Visit: Payer: Self-pay | Admitting: Obstetrics & Gynecology
